# Patient Record
Sex: Male | Born: 1955 | Race: White | Hispanic: No | Smoking: Former smoker
Health system: Southern US, Community
[De-identification: ages and names within clinical notes are randomized; demographics above are authoritative.]

## PROBLEM LIST (undated history)

## (undated) DIAGNOSIS — M069 Rheumatoid arthritis, unspecified: Secondary | ICD-10-CM

## (undated) DIAGNOSIS — J45909 Unspecified asthma, uncomplicated: Secondary | ICD-10-CM

## (undated) DIAGNOSIS — M810 Age-related osteoporosis without current pathological fracture: Secondary | ICD-10-CM

## (undated) DIAGNOSIS — J449 Chronic obstructive pulmonary disease, unspecified: Secondary | ICD-10-CM

## (undated) DIAGNOSIS — C801 Malignant (primary) neoplasm, unspecified: Secondary | ICD-10-CM

## (undated) DIAGNOSIS — J984 Other disorders of lung: Secondary | ICD-10-CM

## (undated) DIAGNOSIS — T8859XA Other complications of anesthesia, initial encounter: Secondary | ICD-10-CM

## (undated) DIAGNOSIS — C679 Malignant neoplasm of bladder, unspecified: Secondary | ICD-10-CM

## (undated) DIAGNOSIS — K219 Gastro-esophageal reflux disease without esophagitis: Secondary | ICD-10-CM

## (undated) DIAGNOSIS — Z87891 Personal history of nicotine dependence: Secondary | ICD-10-CM

## (undated) DIAGNOSIS — N138 Other obstructive and reflux uropathy: Secondary | ICD-10-CM

## (undated) DIAGNOSIS — E785 Hyperlipidemia, unspecified: Secondary | ICD-10-CM

## (undated) DIAGNOSIS — M5430 Sciatica, unspecified side: Secondary | ICD-10-CM

## (undated) DIAGNOSIS — K4090 Unilateral inguinal hernia, without obstruction or gangrene, not specified as recurrent: Secondary | ICD-10-CM

## (undated) DIAGNOSIS — M8580 Other specified disorders of bone density and structure, unspecified site: Secondary | ICD-10-CM

## (undated) DIAGNOSIS — T7840XA Allergy, unspecified, initial encounter: Secondary | ICD-10-CM

## (undated) DIAGNOSIS — M4810 Ankylosing hyperostosis [Forestier], site unspecified: Secondary | ICD-10-CM

## (undated) DIAGNOSIS — R0981 Nasal congestion: Secondary | ICD-10-CM

## (undated) DIAGNOSIS — T4145XA Adverse effect of unspecified anesthetic, initial encounter: Secondary | ICD-10-CM

## (undated) DIAGNOSIS — J8410 Pulmonary fibrosis, unspecified: Secondary | ICD-10-CM

## (undated) DIAGNOSIS — I700 Atherosclerosis of aorta: Secondary | ICD-10-CM

## (undated) DIAGNOSIS — H9312 Tinnitus, left ear: Secondary | ICD-10-CM

## (undated) HISTORY — DX: Pulmonary fibrosis, unspecified: J84.10

## (undated) HISTORY — DX: Age-related osteoporosis without current pathological fracture: M81.0

## (undated) HISTORY — DX: Rheumatoid arthritis, unspecified: M06.9

## (undated) HISTORY — DX: Allergy, unspecified, initial encounter: T78.40XA

## (undated) HISTORY — DX: Other disorders of lung: J98.4

## (undated) HISTORY — DX: Sciatica, unspecified side: M54.30

## (undated) HISTORY — PX: VARICOCELECTOMY: SHX1084

## (undated) HISTORY — PX: HERNIA REPAIR: SHX51

## (undated) HISTORY — DX: Hyperlipidemia, unspecified: E78.5

## (undated) HISTORY — DX: Unspecified asthma, uncomplicated: J45.909

## (undated) HISTORY — DX: Gastro-esophageal reflux disease without esophagitis: K21.9

## (undated) HISTORY — PX: BASAL CELL CARCINOMA EXCISION: SHX1214

## (undated) HISTORY — DX: Chronic obstructive pulmonary disease, unspecified: J44.9

---

## 2010-06-06 HISTORY — PX: COLONOSCOPY: SHX174

## 2014-11-15 DIAGNOSIS — J4530 Mild persistent asthma, uncomplicated: Secondary | ICD-10-CM | POA: Insufficient documentation

## 2014-11-15 DIAGNOSIS — M05712 Rheumatoid arthritis with rheumatoid factor of left shoulder without organ or systems involvement: Secondary | ICD-10-CM

## 2014-11-15 DIAGNOSIS — K219 Gastro-esophageal reflux disease without esophagitis: Secondary | ICD-10-CM | POA: Insufficient documentation

## 2014-11-15 DIAGNOSIS — M810 Age-related osteoporosis without current pathological fracture: Secondary | ICD-10-CM | POA: Insufficient documentation

## 2014-11-15 DIAGNOSIS — M05711 Rheumatoid arthritis with rheumatoid factor of right shoulder without organ or systems involvement: Secondary | ICD-10-CM | POA: Insufficient documentation

## 2014-11-15 DIAGNOSIS — E785 Hyperlipidemia, unspecified: Secondary | ICD-10-CM | POA: Insufficient documentation

## 2014-11-15 DIAGNOSIS — E782 Mixed hyperlipidemia: Secondary | ICD-10-CM | POA: Insufficient documentation

## 2014-11-15 DIAGNOSIS — J449 Chronic obstructive pulmonary disease, unspecified: Secondary | ICD-10-CM | POA: Insufficient documentation

## 2015-05-22 DIAGNOSIS — K4090 Unilateral inguinal hernia, without obstruction or gangrene, not specified as recurrent: Secondary | ICD-10-CM | POA: Insufficient documentation

## 2016-02-06 DIAGNOSIS — J8410 Pulmonary fibrosis, unspecified: Secondary | ICD-10-CM | POA: Insufficient documentation

## 2016-11-13 ENCOUNTER — Encounter: Payer: Self-pay | Admitting: Internal Medicine

## 2016-11-13 ENCOUNTER — Other Ambulatory Visit: Payer: Self-pay | Admitting: Internal Medicine

## 2016-11-14 ENCOUNTER — Ambulatory Visit
Admission: RE | Admit: 2016-11-14 | Discharge: 2016-11-14 | Disposition: A | Discharge status: Home / Self Care | Source: Ambulatory Visit | Attending: Internal Medicine | Admitting: Internal Medicine

## 2016-11-14 ENCOUNTER — Ambulatory Visit (INDEPENDENT_AMBULATORY_CARE_PROVIDER_SITE_OTHER): Admitting: Internal Medicine

## 2016-11-14 ENCOUNTER — Encounter: Payer: Self-pay | Admitting: Internal Medicine

## 2016-11-14 VITALS — BP 122/86 | HR 77 | Resp 16 | Ht 70.0 in | Wt 183.0 lb

## 2016-11-14 DIAGNOSIS — E782 Mixed hyperlipidemia: Secondary | ICD-10-CM

## 2016-11-14 DIAGNOSIS — M4810 Ankylosing hyperostosis [Forestier], site unspecified: Secondary | ICD-10-CM | POA: Diagnosis not present

## 2016-11-14 DIAGNOSIS — M05711 Rheumatoid arthritis with rheumatoid factor of right shoulder without organ or systems involvement: Secondary | ICD-10-CM | POA: Diagnosis not present

## 2016-11-14 DIAGNOSIS — J984 Other disorders of lung: Secondary | ICD-10-CM

## 2016-11-14 DIAGNOSIS — M5136 Other intervertebral disc degeneration, lumbar region: Secondary | ICD-10-CM | POA: Diagnosis not present

## 2016-11-14 DIAGNOSIS — J4530 Mild persistent asthma, uncomplicated: Secondary | ICD-10-CM

## 2016-11-14 DIAGNOSIS — J8410 Pulmonary fibrosis, unspecified: Secondary | ICD-10-CM | POA: Diagnosis not present

## 2016-11-14 DIAGNOSIS — M05712 Rheumatoid arthritis with rheumatoid factor of left shoulder without organ or systems involvement: Secondary | ICD-10-CM

## 2016-11-14 DIAGNOSIS — M810 Age-related osteoporosis without current pathological fracture: Secondary | ICD-10-CM

## 2016-11-14 NOTE — Progress Notes (Signed)
Date:  11/14/2016   Name:  Nathan Anthony   DOB:  06/09/1956   MRN:  [id number]   Chief Complaint: Establish Care (Follows Rheumotology in [facility] and will stay with him. He can add [place] but I can not add him to care team. )  RA-  On Humira and mobic.  Most of sx in shoulders and right hand.  Has been on Humira for 15 years.  Osteoporosis - on Prolia for the past years.  Doing well on therapy.  Getting this from Rheum in [place].  No hx of prolonged steroid therapy.  Hyperlipidemia  This is a chronic problem. Recent lipid tests were reviewed and are normal. Pertinent negatives include no chest pain, myalgias or shortness of breath. Current antihyperlipidemic treatment includes statins.  Asthma  He complains of cough and wheezing. There is no shortness of breath. This is a chronic problem. Pertinent negatives include no appetite change, chest pain, headaches, myalgias or trouble swallowing. His past medical history is significant for asthma.   Back injury - had 2 injuries to his back while in the [employer].  He has a 10% disability.  In 1997 he was dx'd with ankylosing spondylitis but this was misdiagnosed and is actually DISH.  He has intermittent tingling on both LE's if he spends too much time driving.  This is worsening over the past 2 years.  He discussed this with Rheum who deferred further evaluation.  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  It was discovered incidentally when he was worked up for new onset asthma around age 13.  Review of Systems  Constitutional: Negative for appetite change, chills, diaphoresis, fatigue and unexpected weight change.  HENT: Negative for hearing loss, tinnitus, trouble swallowing and voice change.   Eyes: Negative for visual disturbance.  Respiratory: Positive for cough and wheezing. Negative for choking and shortness of breath.   Cardiovascular: Negative for chest pain, palpitations and leg swelling.  Gastrointestinal:  Negative for abdominal pain, blood in stool, constipation and diarrhea.  Genitourinary: Negative for difficulty urinating, dysuria and frequency.  Musculoskeletal: Positive for arthralgias and back pain. Negative for myalgias.  Skin: Negative for color change and rash.  Neurological: Negative for dizziness, syncope and headaches.  Hematological: Negative for adenopathy.  Psychiatric/Behavioral: Negative for dysphoric mood and sleep disturbance.    Patient Active Problem List   Diagnosis Date Noted  . 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 Unilateral inguinal hernia without obstruction or gangrene 05/22/2015  . GERD (gastroesophageal reflux disease) 11/15/2014  . Hyperlipidemia 11/15/2014  . Mild persistent asthma without complication 33/29/5188  . Osteoporosis 11/15/2014  . Rheumatoid arthritis involving both shoulders ([place]) 11/15/2014    Prior to Admission medications   Medication Sig Start Date End Date Taking? Authorizing Provider  Adalimumab (HUMIRA PEN) 40 MG/0.8ML PNKT Inject 40 mg into the skin every 14 (fourteen) days. 03/07/16  Yes Historical Provider, MD  denosumab (PROLIA) 60 MG/ML SOLN injection Inject 60 mg into the skin every 6 (six) months.  04/17/15  Yes Historical Provider, MD  fluocinonide cream (LIDEX) 0.05 % Apply topically 2 (two) times daily. 07/19/15  Yes Historical Provider, MD  Fluticasone-Salmeterol (ADVAIR DISKUS) 250-50 MCG/DOSE AEPB Take 1 puff by mouth 2 (two) times daily. 02/06/16 02/05/17 Yes Historical Provider, MD  loratadine (CLARITIN) 10 MG tablet Take 10 mg by mouth daily.   Yes Historical Provider, MD  meloxicam (MOBIC) 15 MG tablet Take 15 mg by mouth daily.   Yes  Historical Provider, MD  montelukast (SINGULAIR) 10 MG tablet Take 10 mg by mouth daily. 08/11/16  Yes Historical Provider, MD  omeprazole (PRILOSEC) 20 MG capsule Take 20 mg by mouth daily. 08/27/16  Yes Historical Provider, MD  rosuvastatin (CRESTOR) 10 MG tablet Take 10 mg by mouth  daily. 08/11/16  Yes Historical Provider, MD    Allergies  Allergen Reactions  . Pravastatin     Other reaction(s): Nausea Only  . Simvastatin     Other reaction(s): Nausea Only    Past Surgical History:  Procedure Laterality Date  . COLONOSCOPY  06/06/2010  . HERNIA REPAIR    . VARICOCELECTOMY    . VARICOCELECTOMY      Social History  Substance Use Topics  . Smoking status: Former Smoker    Years: 5.00    Types: Pipe  . Smokeless tobacco: Never Used  . Alcohol use No     Medication list has been reviewed and updated.   Physical Exam  Constitutional: He is oriented to person, place, and time. He appears well-developed. No distress.  HENT:  Head: Normocephalic and atraumatic.  Neck: Normal range of motion. Neck supple.  Cardiovascular: Normal rate, regular rhythm and normal heart sounds.   Pulmonary/Chest: Effort normal and breath sounds normal. No respiratory distress.  Musculoskeletal:       Right hip: He exhibits decreased range of motion and tenderness.       Lumbar back: He exhibits tenderness and bony tenderness.  Neurological: He is alert and oriented to person, place, and time. He has normal strength and normal reflexes. No sensory deficit.  SLR negative  Skin: Skin is warm and dry. No rash noted.  Psychiatric: He has a normal mood and affect. His speech is normal and behavior is normal. Thought content normal.  Nursing note and vitals reviewed.   BP 122/86   Pulse 77   Resp 16   Ht 5\' 10"  (1.778 m)   Wt 183 lb (83 kg)   SpO2 97%   BMI 26.26 kg/m   Assessment and Plan: 1. DISH (diffuse idiopathic skeletal hyperostosis) Consider steroid taper and/or Ortho referral - DG Lumbar Spine 2-3 Views; Future  2. Mild persistent asthma without complication Persistent sx despite optimal therapy - Ambulatory referral to Pulmonology  3. 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[place]) Follow up with Rheum  4. Mixed  hyperlipidemia On statin therapy  5. 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[facility]) Stable, no further follow up needed  6. Osteoporosis without current pathological fracture, unspecified osteoporosis type Continue Prolia through Rheumatology   No orders of the defined types were placed in this encounter.   Halina Maidens, MD [facility]  11/14/2016

## 2016-11-17 ENCOUNTER — Other Ambulatory Visit: Payer: Self-pay | Admitting: Internal Medicine

## 2016-11-17 MED ORDER — PREDNISONE 10 MG PO TABS: ORAL_TABLET | ORAL | 0 refills | Status: DC

## 2016-12-23 ENCOUNTER — Encounter: Payer: Self-pay | Admitting: Internal Medicine

## 2016-12-23 ENCOUNTER — Ambulatory Visit (INDEPENDENT_AMBULATORY_CARE_PROVIDER_SITE_OTHER): Admitting: Internal Medicine

## 2016-12-23 VITALS — BP 148/98 | HR 76 | Resp 16 | Ht 70.0 in | Wt 186.0 lb

## 2016-12-23 DIAGNOSIS — J4540 Moderate persistent asthma, uncomplicated: Secondary | ICD-10-CM

## 2016-12-23 MED ORDER — AZELASTINE-FLUTICASONE 137-50 MCG/ACT NA SUSP: 1.0000 | Freq: Two times a day (BID) | NASAL | 3 refills | Status: DC

## 2016-12-23 NOTE — Patient Instructions (Signed)
Start Dymista Continue Claritan Contineu Advair 250/50 as prescrtibed Check PFT's Need labs for EOS, IgE levels Check alpha one levels

## 2016-12-23 NOTE — Progress Notes (Signed)
[place] [facility] Consultation      Date: 12/23/2016,   MRN# [id number] Nathan Anthony [facility] Aug 23, 1956 Code Status:  Code Status History    This patient does not have a recorded code status. Please follow your organizational policy for patients in this situation.     Hosp day:@LENGTHOFSTAYDAYS @ Referring MD: @ATDPROV @     PCP:      AdmissionWeight: 186 lb (84.4 kg)                 CurrentWeight: 186 lb (84.4 kg) Nathan Anthony is a 61 y.o. old male seen in consultation for ASTHMA at the request of Dr. Carolin Coy     CHIEF COMPLAINT:   Cough and wheezing   HISTORY OF PRESENT ILLNESS   61 yo pleasant white male here today to assess ASTHMA Dx in 1997 with ASTHMA, EIB had been on advair 100 bid for 15 years changed to 250 BID 1 year ago Dx with GERD 1996 Retired from [employer] in 2000 Had frequent ASTHMA attacks 10 years ago Last seen by Pulmonary 2003 Last month had ASTHMA exacerbation had prednisone taper last month-feels better  Has chronic SOB and DOE With wheezing and cough and chest congestion Had smoked pipe for 30 years chronic sputum production Has chronic allergic rhinitis-takes claritan daily His asthma is not that well controlled at this time  Has chornic   PAST MEDICAL HISTORY   Past Medical History:  Diagnosis Date  . Allergy   . Asthma   . GERD (gastroesophageal reflux disease)   . Hyperlipidemia   . Osteoporosis   . Sciatica      SURGICAL HISTORY   Past Surgical History:  Procedure Laterality Date  . COLONOSCOPY  06/06/2010  . HERNIA REPAIR    . VARICOCELECTOMY    . VARICOCELECTOMY       FAMILY HISTORY   Family History  Problem Relation Age of Onset  . Heart disease Mother   . Asthma Mother   . Heart disease Brother   . Ankylosing spondylitis Brother      SOCIAL HISTORY   Social History  Substance Use Topics  . Smoking status: Former Smoker    Years: 5.00    Types: Pipe  . Smokeless tobacco: Never Used  .  Alcohol use No     MEDICATIONS    Home Medication:  Current Outpatient Rx  . Order #: [id number] Class: Historical Med  . Order #: [id number] Class: Historical Med  . Order #: [id number] Class: Historical Med  . Order #: [id number] Class: Historical Med  . Order #: [id number] Class: Historical Med  . Order #: [id number] Class: Historical Med  . Order #: [id number] Class: Historical Med  . Order #: [id number] Class: Historical Med  . Order #: [id number] Class: Normal  . Order #: [id number] Class: Historical Med    Current Medication:  Current Outpatient Prescriptions:  .  Adalimumab (HUMIRA PEN) 40 MG/0.8ML PNKT, Inject 40 mg into the skin every 14 (fourteen) days., Disp: , Rfl:  .  denosumab (PROLIA) 60 MG/ML SOLN injection, Inject 60 mg into the skin every 6 (six) months. , Disp: , Rfl:  .  fluocinonide cream (LIDEX) 0.05 %, Apply topically 2 (two) times daily., Disp: , Rfl:  .  Fluticasone-Salmeterol (ADVAIR DISKUS) 250-50 MCG/DOSE AEPB, Take 1 puff by mouth 2 (two) times daily., Disp: , Rfl:  .  loratadine (CLARITIN) 10 MG tablet, Take 10 mg by mouth daily., Disp: , Rfl:  .  meloxicam (MOBIC) 15 MG tablet, Take 15 mg by mouth daily.,  Disp: , Rfl:  .  montelukast (SINGULAIR) 10 MG tablet, Take 10 mg by mouth daily., Disp: , Rfl:  .  omeprazole (PRILOSEC) 20 MG capsule, Take 20 mg by mouth daily., Disp: , Rfl:  .  predniSONE (DELTASONE) 10 MG tablet, Take 6 on day 1, 5 on day 2, 4 on day 3, 3 on day 4, 2 on day 5 and 1 on day 1 then stop., Disp: 21 tablet, Rfl: 0 .  rosuvastatin (CRESTOR) 10 MG tablet, Take 10 mg by mouth daily., Disp: , Rfl:     ALLERGIES   Pravastatin and Simvastatin     REVIEW OF SYSTEMS   Review of Systems  Constitutional: Negative for chills, diaphoresis, fever, malaise/fatigue and weight loss.  HENT: Negative for congestion and hearing loss.   Eyes: Negative for blurred vision and double vision.  Respiratory: Positive for cough, shortness of breath and wheezing.     Cardiovascular: Negative for chest pain, palpitations and orthopnea.  Gastrointestinal: Negative for abdominal pain, blood in stool, constipation, diarrhea, heartburn, nausea and vomiting.  Genitourinary: Negative for dysuria and urgency.  Musculoskeletal: Negative for back pain, myalgias and neck pain.  Skin: Negative for rash.  Neurological: Negative for dizziness, tingling, tremors, weakness and headaches.  Endo/Heme/Allergies: Does not bruise/bleed easily.  Psychiatric/Behavioral: Negative for depression, substance abuse and suicidal ideas.  All other systems reviewed and are negative.   BP (!) 148/98 (BP Location: Left Arm, Patient Position: Sitting, Cuff Size: Normal)   Pulse 76   Resp 16   Ht 5\' 10"  (1.778 m)   Wt 186 lb (84.4 kg)   SpO2 95%   BMI 26.69 kg/m    PHYSICAL EXAM  Physical Exam  Constitutional: He is oriented to person, place, and time. He appears well-developed and well-nourished. No distress.  HENT:  Head: Normocephalic and atraumatic.  Mouth/Throat: No oropharyngeal exudate.  Eyes: EOM are normal. Pupils are equal, round, and reactive to light. No scleral icterus.  Neck: Normal range of motion. Neck supple.  Cardiovascular: Normal rate, regular rhythm and normal heart sounds.   No murmur heard. Pulmonary/Chest: No stridor. He is in respiratory distress. He has no wheezes.  Abdominal: Soft. Bowel sounds are normal.  Musculoskeletal: Normal range of motion. He exhibits no edema.  Neurological: He is alert and oriented to person, place, and time. No cranial nerve deficit.  Skin: Skin is warm. He is not diaphoretic.  Psychiatric: He has a normal mood and affect.          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 All questions answered  Corrin Parker, M.D.  [facility]  Medical Director [place] Director [facility] Cardio-Pulmonary Department

## 2016-12-23 NOTE — Addendum Note (Signed)
Addended by: Devona Konig on: 12/23/2016 04:27 PM   Modules accepted: Orders

## 2016-12-25 ENCOUNTER — Telehealth: Payer: Self-pay | Admitting: Internal Medicine

## 2016-12-25 NOTE — Telephone Encounter (Signed)
Pt is calling regarding paperwork that needs to be filled out regarding Dymista. Please call.

## 2016-12-25 NOTE — Telephone Encounter (Signed)
Done PA for Dymista which has been approved from 11/24/16 to 08/31/2098.  Case ID: [id number].

## 2016-12-25 NOTE — Telephone Encounter (Signed)
Spoke with pt and he states that [facility] is having an issue with the Dymista. Pt gave a number for [place] to call at [phone]. Will call to f/u on Dymista.

## 2017-01-01 ENCOUNTER — Telehealth: Payer: Self-pay | Admitting: Internal Medicine

## 2017-01-01 DIAGNOSIS — J4540 Moderate persistent asthma, uncomplicated: Secondary | ICD-10-CM

## 2017-01-01 NOTE — Telephone Encounter (Signed)
Pt calling stating we were to send some labs that Dr Mortimer Fries ordered to [facility] They were not there when he called  Please advise

## 2017-01-01 NOTE — Telephone Encounter (Signed)
Spoke with pt and he wants to go to a [facility] site instead of hospital. Labs re-entered. Nothing further needed.

## 2017-01-02 ENCOUNTER — Other Ambulatory Visit: Payer: Self-pay | Admitting: *Deleted

## 2017-01-02 ENCOUNTER — Encounter: Payer: Self-pay | Admitting: Internal Medicine

## 2017-01-02 MED ORDER — ALBUTEROL SULFATE HFA 108 (90 BASE) MCG/ACT IN AERS: 1.0000 | INHALATION_SPRAY | Freq: Four times a day (QID) | RESPIRATORY_TRACT | 3 refills | Status: DC | PRN

## 2017-01-05 ENCOUNTER — Other Ambulatory Visit
Admission: RE | Admit: 2017-01-05 | Discharge: 2017-01-05 | Disposition: A | Discharge status: Home / Self Care | Source: Ambulatory Visit | Attending: Internal Medicine | Admitting: Internal Medicine

## 2017-01-05 ENCOUNTER — Other Ambulatory Visit: Payer: Self-pay | Admitting: Internal Medicine

## 2017-01-05 DIAGNOSIS — K4090 Unilateral inguinal hernia, without obstruction or gangrene, not specified as recurrent: Secondary | ICD-10-CM | POA: Diagnosis not present

## 2017-01-05 DIAGNOSIS — J8410 Pulmonary fibrosis, unspecified: Secondary | ICD-10-CM

## 2017-01-05 LAB — CBC WITH DIFFERENTIAL/PLATELET
BASOS PCT: 1 %
Basophils Absolute: 0.1 10*3/uL (ref 0–0.1)
EOS ABS: 0.6 10*3/uL (ref 0–0.7)
EOS PCT: 8 %
HCT: 43.8 % (ref 40.0–52.0)
HEMOGLOBIN: 15.1 g/dL (ref 13.0–18.0)
Lymphocytes Relative: 36 %
Lymphs Abs: 2.9 10*3/uL (ref 1.0–3.6)
MCH: 30.7 pg (ref 26.0–34.0)
MCHC: 34.6 g/dL (ref 32.0–36.0)
MCV: 88.7 fL (ref 80.0–100.0)
Monocytes Absolute: 0.6 10*3/uL (ref 0.2–1.0)
Monocytes Relative: 8 %
NEUTROS PCT: 47 %
Neutro Abs: 3.9 10*3/uL (ref 1.4–6.5)
PLATELETS: 269 10*3/uL (ref 150–440)
RBC: 4.93 MIL/uL (ref 4.40–5.90)
RDW: 13.7 % (ref 11.5–14.5)
WBC: 8.2 10*3/uL (ref 3.8–10.6)

## 2017-01-07 ENCOUNTER — Other Ambulatory Visit
Admission: RE | Admit: 2017-01-07 | Discharge: 2017-01-07 | Disposition: A | Discharge status: Home / Self Care | Source: Ambulatory Visit | Attending: Internal Medicine | Admitting: Internal Medicine

## 2017-01-08 LAB — EOSINOPHIL, URINE: Eosinophil, Urine: NONE SEEN %

## 2017-01-08 LAB — IGE: IgE (Immunoglobulin E), Serum: 119 IU/mL — ABNORMAL HIGH (ref 0–100)

## 2017-01-15 ENCOUNTER — Ambulatory Visit: Attending: Internal Medicine

## 2017-01-15 DIAGNOSIS — J4540 Moderate persistent asthma, uncomplicated: Secondary | ICD-10-CM | POA: Diagnosis not present

## 2017-01-15 MED ORDER — ALBUTEROL SULFATE (2.5 MG/3ML) 0.083% IN NEBU
2.5000 mg | INHALATION_SOLUTION | Freq: Once | RESPIRATORY_TRACT | Status: AC
  Administered 2017-01-15: 2.5 mg via RESPIRATORY_TRACT
  Filled 2017-01-15: qty 3

## 2017-01-20 ENCOUNTER — Encounter: Payer: Self-pay | Admitting: Internal Medicine

## 2017-01-20 ENCOUNTER — Ambulatory Visit (INDEPENDENT_AMBULATORY_CARE_PROVIDER_SITE_OTHER): Admitting: Internal Medicine

## 2017-01-20 VITALS — BP 122/90 | HR 86 | Resp 16 | Ht 70.0 in | Wt 172.0 lb

## 2017-01-20 DIAGNOSIS — J449 Chronic obstructive pulmonary disease, unspecified: Secondary | ICD-10-CM | POA: Diagnosis not present

## 2017-01-20 DIAGNOSIS — J4540 Moderate persistent asthma, uncomplicated: Secondary | ICD-10-CM

## 2017-01-20 DIAGNOSIS — G4719 Other hypersomnia: Secondary | ICD-10-CM

## 2017-01-20 MED ORDER — AZELASTINE-FLUTICASONE 137-50 MCG/ACT NA SUSP: 1.0000 | Freq: Two times a day (BID) | NASAL | 0 refills | Status: DC

## 2017-01-20 NOTE — Patient Instructions (Signed)
Check alpha one levels Check ONO Start Spiriva Handihaler

## 2017-01-20 NOTE — Addendum Note (Signed)
Addended by: Devona Konig on: 01/20/2017 10:55 AM   Modules accepted: Orders

## 2017-01-20 NOTE — Progress Notes (Signed)
[place] [facility] Consultation      Date: 01/20/2017,   MRN# [id number] Nathan Anthony [facility] Jan 18, 1956 Code Status:  Code Status History    This patient does not have a recorded code status. Please follow your organizational policy for patients in this situation.     Hosp day:@LENGTHOFSTAYDAYS @ Referring MD: @ATDPROV @     PCP:      Admission                  Current  Nathan Anthony is a 61 y.o. old male seen in consultation for ASTHMA at the request of Dr. Carolin Coy     CHIEF COMPLAINT:   Cough and wheezing   HISTORY OF PRESENT ILLNESS   PREVIOUS OV and History 61 yo pleasant white male here today to assess ASTHMA Dx in 1997 with ASTHMA, EIB had been on advair 100 bid for 15 years changed to 250 BID 1 year ago Dx with GERD 1996 Retired from [employer] in 2000 Had frequent ASTHMA attacks 10 years ago Last seen by Pulmonary 2003 Last month had ASTHMA exacerbation had prednisone taper last month-feels better  Has chronic SOB and DOE With wheezing and cough and chest congestion Had smoked pipe for 30 years chronic sputum production Has chronic allergic rhinitis-takes claritan daily His asthma is not that well controlled at this time   OV 01/20/2017 Follow up PFT's show ratio 69% with Fev1 67% +BD response Scooping of exp limb,air trapping Findings c/w Moderate COPD Has intermittent SOB and DOE Has intermittent wheezing Takes advair No signs of infection at this time  Patient has been having excessive daytime sleepiness Patient has been having extreme fatigue and tiredness, lack of energy Labs and PFT's reviewed with patient      Current Medication:  Current Outpatient Prescriptions:  .  Adalimumab (HUMIRA PEN) 40 MG/0.8ML PNKT, Inject 40 mg into the skin every 14 (fourteen) days., Disp: , Rfl:  .  albuterol (PROVENTIL HFA;VENTOLIN HFA) 108 (90 Base) MCG/ACT inhaler, Inhale 1 puff into the lungs every 6 (six) hours as needed for wheezing or  shortness of breath., Disp: 3 Inhaler, Rfl: 3 .  Azelastine-Fluticasone (DYMISTA) 137-50 MCG/ACT SUSP, Place 1 spray into the nose 2 (two) times daily., Disp: 3 Bottle, Rfl: 3 .  denosumab (PROLIA) 60 MG/ML SOLN injection, Inject 60 mg into the skin every 6 (six) months. , Disp: , Rfl:  .  fluocinonide cream (LIDEX) 0.05 %, Apply topically 2 (two) times daily., Disp: , Rfl:  .  Fluticasone-Salmeterol (ADVAIR DISKUS) 250-50 MCG/DOSE AEPB, Take 1 puff by mouth 2 (two) times daily., Disp: , Rfl:  .  loratadine (CLARITIN) 10 MG tablet, Take 10 mg by mouth daily., Disp: , Rfl:  .  meloxicam (MOBIC) 15 MG tablet, Take 15 mg by mouth daily., Disp: , Rfl:  .  montelukast (SINGULAIR) 10 MG tablet, Take 10 mg by mouth daily., Disp: , Rfl:  .  omeprazole (PRILOSEC) 20 MG capsule, Take 20 mg by mouth daily., Disp: , Rfl:  .  PROCTOFOAM HC rectal foam, Place 1 application rectally as needed., Disp: , Rfl:  .  rosuvastatin (CRESTOR) 10 MG tablet, Take 10 mg by mouth daily., Disp: , Rfl:     ALLERGIES   Pravastatin and Simvastatin     REVIEW OF SYSTEMS   Review of Systems  Constitutional: Negative for chills, diaphoresis, fever, malaise/fatigue and weight loss.  HENT: Positive for congestion. Negative for hearing loss.   Eyes: Negative for blurred vision and  double vision.  Respiratory: Positive for cough, shortness of breath and wheezing.   Cardiovascular: Negative for chest pain, palpitations and orthopnea.  Gastrointestinal: Negative for heartburn.  Skin: Negative for rash.  Neurological: Negative for weakness.  All other systems reviewed and are negative.   BP 122/90 (BP Location: Right Arm, Cuff Size: Normal)   Pulse 86   Resp 16   Ht 5\' 10"  (1.778 m)   Wt 172 lb (78 kg)   SpO2 98%   BMI 24.68 kg/m    PHYSICAL EXAM  Physical Exam  Constitutional: He is oriented to person, place, and time. He appears well-developed and well-nourished. No distress.  Eyes: Pupils are equal, round,  and reactive to light.  Cardiovascular: Normal rate, regular rhythm and normal heart sounds.   No murmur heard. Pulmonary/Chest: Breath sounds normal. No stridor. No respiratory distress. He has no wheezes.  Musculoskeletal: Normal range of motion. He exhibits no edema.  Neurological: He is alert and oriented to person, place, and time. No cranial nerve deficit.  Skin: Skin is warm. He is not diaphoretic.  Psychiatric: He has a normal mood and affect.       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, art with previous h/o ASTHMA now with Moderate Gold Stage B COPD  with chronic allergic rhinitis with intermittent SOB/Wheezing with excessive daytime sleepiness and fatigue   SOB/wheezing-ASTHMA/COPD -Will prescribe additional inhaler with Spiriva  Allergic Rhinitis -continue dymista -continue claritian -This helps him a lot but is expensive  H/o ASTHMA/now Moderate COPD -continue advri 250/50 Start Spiriva handihaler Albuterol as needed Will need to check for alpha one deficiency -will need to check ONO  Excessive daytime fatigue/sleepniess -patient will need sleep study at this time but has refused at this time   GERD -continue PPI  Follow up after 41months  Patient  satisfied with Plan of action and management. All questions answered  Corrin Parker, M.D.  [facility]  Medical Director [place] Director [facility] Cardio-Pulmonary Department

## 2017-01-22 ENCOUNTER — Encounter: Payer: Self-pay | Admitting: Internal Medicine

## 2017-01-22 DIAGNOSIS — J4540 Moderate persistent asthma, uncomplicated: Secondary | ICD-10-CM

## 2017-02-03 ENCOUNTER — Telehealth: Payer: Self-pay | Admitting: *Deleted

## 2017-02-03 NOTE — Telephone Encounter (Signed)
Pt informed he does NOT need O2 for night time use. Nothing further needed.

## 2017-02-16 ENCOUNTER — Other Ambulatory Visit: Payer: Self-pay | Admitting: Internal Medicine

## 2017-02-16 ENCOUNTER — Encounter: Payer: Self-pay | Admitting: Internal Medicine

## 2017-02-16 MED ORDER — ROSUVASTATIN CALCIUM 10 MG PO TABS: 10.0000 mg | ORAL_TABLET | Freq: Every day | ORAL | 1 refills | Status: DC

## 2017-02-16 NOTE — Telephone Encounter (Signed)
Medication refill request.

## 2017-02-17 ENCOUNTER — Other Ambulatory Visit: Payer: Self-pay | Admitting: Internal Medicine

## 2017-02-17 MED ORDER — ROSUVASTATIN CALCIUM 10 MG PO TABS: 10.0000 mg | ORAL_TABLET | Freq: Every day | ORAL | 1 refills | Status: DC

## 2017-02-17 NOTE — Telephone Encounter (Signed)
Patient response

## 2017-03-13 ENCOUNTER — Telehealth: Payer: Self-pay | Admitting: *Deleted

## 2017-03-13 NOTE — Telephone Encounter (Signed)
Patient returning call ..please try again

## 2017-03-13 NOTE — Telephone Encounter (Signed)
R/T call to patient he misunderstood when he was given results of ONO. He thought he did not still need sleep study performed. Re-explained to patient that was to determine if night-time 02 was needed. He still needs to schedule sleep study. Patient has been out of town but will call and schedule.

## 2017-03-13 NOTE — Telephone Encounter (Signed)
Attempted to contact patient in regards to scheduling sleep study. Received info that [facility] has been trying to contact patient. Asked that patient return call so that we know how to proceed.

## 2017-04-01 ENCOUNTER — Ambulatory Visit: Attending: Internal Medicine

## 2017-04-01 ENCOUNTER — Encounter: Payer: Self-pay | Admitting: Internal Medicine

## 2017-04-01 DIAGNOSIS — G4710 Hypersomnia, unspecified: Secondary | ICD-10-CM | POA: Diagnosis not present

## 2017-04-01 DIAGNOSIS — G4730 Sleep apnea, unspecified: Secondary | ICD-10-CM | POA: Diagnosis present

## 2017-04-01 DIAGNOSIS — G4719 Other hypersomnia: Secondary | ICD-10-CM

## 2017-04-01 DIAGNOSIS — R0683 Snoring: Secondary | ICD-10-CM | POA: Diagnosis not present

## 2017-04-06 ENCOUNTER — Encounter: Payer: Self-pay | Admitting: Internal Medicine

## 2017-04-06 ENCOUNTER — Telehealth: Payer: Self-pay | Admitting: *Deleted

## 2017-04-06 ENCOUNTER — Telehealth: Payer: Self-pay | Admitting: Internal Medicine

## 2017-04-06 DIAGNOSIS — G4710 Hypersomnia, unspecified: Secondary | ICD-10-CM

## 2017-04-06 NOTE — Telephone Encounter (Signed)
Pt would like be seen sooner for sleep study results Pt declined Sept 12th appt, he is unavaliable that week Scheduled next available for Sept 17

## 2017-04-06 NOTE — Telephone Encounter (Signed)
-----   Message from Laverle Hobby, MD sent at 04/06/2017  8:54 AM EDT ----- Regarding: sleep study results This study is negative for Sleep Apnea.   Recommendation:  Follow up with referring physician.

## 2017-04-06 NOTE — Telephone Encounter (Signed)
Patient has been informed negative for OSA. Patient informed to keep his regular scheduled f/u with Dr. Mortimer Fries.

## 2017-04-06 NOTE — Telephone Encounter (Signed)
Patient aware of results of sleep study. Nothing further needed.

## 2017-04-13 ENCOUNTER — Other Ambulatory Visit: Payer: Self-pay

## 2017-04-13 ENCOUNTER — Encounter: Payer: Self-pay | Admitting: Internal Medicine

## 2017-04-13 MED ORDER — OMEPRAZOLE 20 MG PO CPDR
20.0000 mg | DELAYED_RELEASE_CAPSULE | Freq: Every day | ORAL | 0 refills | Status: DC
Start: 2017-04-13 — End: 2017-04-22

## 2017-04-13 MED ORDER — MONTELUKAST SODIUM 10 MG PO TABS: 10.0000 mg | ORAL_TABLET | Freq: Every day | ORAL | 0 refills | Status: DC

## 2017-04-22 ENCOUNTER — Encounter: Payer: Self-pay | Admitting: Internal Medicine

## 2017-04-22 ENCOUNTER — Ambulatory Visit (INDEPENDENT_AMBULATORY_CARE_PROVIDER_SITE_OTHER): Admitting: Internal Medicine

## 2017-04-22 VITALS — BP 112/76 | HR 64 | Resp 16 | Ht 70.0 in | Wt 180.2 lb

## 2017-04-22 DIAGNOSIS — H9312 Tinnitus, left ear: Secondary | ICD-10-CM | POA: Diagnosis not present

## 2017-04-22 DIAGNOSIS — K219 Gastro-esophageal reflux disease without esophagitis: Secondary | ICD-10-CM

## 2017-04-22 DIAGNOSIS — M810 Age-related osteoporosis without current pathological fracture: Secondary | ICD-10-CM

## 2017-04-22 DIAGNOSIS — M05711 Rheumatoid arthritis with rheumatoid factor of right shoulder without organ or systems involvement: Secondary | ICD-10-CM

## 2017-04-22 DIAGNOSIS — Z0000 Encounter for general adult medical examination without abnormal findings: Secondary | ICD-10-CM | POA: Diagnosis not present

## 2017-04-22 DIAGNOSIS — Z23 Encounter for immunization: Secondary | ICD-10-CM

## 2017-04-22 DIAGNOSIS — Z1159 Encounter for screening for other viral diseases: Secondary | ICD-10-CM | POA: Diagnosis not present

## 2017-04-22 DIAGNOSIS — M4810 Ankylosing hyperostosis [Forestier], site unspecified: Secondary | ICD-10-CM

## 2017-04-22 DIAGNOSIS — M05712 Rheumatoid arthritis with rheumatoid factor of left shoulder without organ or systems involvement: Secondary | ICD-10-CM | POA: Diagnosis not present

## 2017-04-22 DIAGNOSIS — Z125 Encounter for screening for malignant neoplasm of prostate: Secondary | ICD-10-CM | POA: Diagnosis not present

## 2017-04-22 DIAGNOSIS — E782 Mixed hyperlipidemia: Secondary | ICD-10-CM | POA: Diagnosis not present

## 2017-04-22 DIAGNOSIS — J4530 Mild persistent asthma, uncomplicated: Secondary | ICD-10-CM | POA: Diagnosis not present

## 2017-04-22 LAB — POCT URINALYSIS DIPSTICK
BILIRUBIN UA: NEGATIVE
Blood, UA: NEGATIVE
GLUCOSE UA: NEGATIVE
Ketones, UA: NEGATIVE
Leukocytes, UA: NEGATIVE
Nitrite, UA: NEGATIVE
Protein, UA: NEGATIVE
SPEC GRAV UA: 1.01 (ref 1.010–1.025)
UROBILINOGEN UA: 0.2 U/dL
pH, UA: 7 (ref 5.0–8.0)

## 2017-04-22 MED ORDER — MONTELUKAST SODIUM 10 MG PO TABS: 10.0000 mg | ORAL_TABLET | Freq: Every day | ORAL | 1 refills | Status: DC

## 2017-04-22 MED ORDER — OMEPRAZOLE 20 MG PO CPDR: 20.0000 mg | DELAYED_RELEASE_CAPSULE | Freq: Every day | ORAL | 1 refills | Status: DC

## 2017-04-22 NOTE — Progress Notes (Signed)
Date:  04/22/2017   Name:  Nathan Anthony   DOB:  [birth year]   MRN:  [id number]   Chief Complaint: Annual Exam (ringing in ears ) Nathan Anthony is a 61 y.o. male who presents today for his Complete Annual Exam. He feels fairly well. He reports exercising regularly. He reports he is sleeping fairly well.   Hyperlipidemia  This is a chronic problem. Pertinent negatives include no chest pain or shortness of breath. Current antihyperlipidemic treatment includes statins. The current treatment provides significant improvement of lipids.  Gastroesophageal Reflux  He complains of wheezing. He reports no abdominal pain, no chest pain or no coughing. Pertinent negatives include no fatigue. He has tried a PPI for the symptoms. The treatment provided significant relief.  Asthma  He complains of wheezing. There is no cough or shortness of breath. This is a recurrent problem. The problem occurs intermittently. Pertinent negatives include no chest pain or headaches. His symptoms are not alleviated by steroid inhaler, beta-agonist and leukotriene antagonist. His past medical history is significant for asthma.  Rheumatoid Arthritis - currently seeing Rheum in [place] but needs to change to a nearby provider. On Humira and nsaids.  Has about 3 hours of morning stiffness in hands, worse with more activity.   Review of Systems  Constitutional: Negative for chills and fatigue.  HENT: Positive for tinnitus (in left ear). Negative for hearing loss.   Eyes: Negative for visual disturbance.  Respiratory: Positive for wheezing. Negative for cough, chest tightness and shortness of breath.   Cardiovascular: Negative for chest pain, palpitations and leg swelling.  Gastrointestinal: Negative for abdominal pain, blood in stool and constipation.  Endocrine: Negative for polydipsia and polyuria.  Genitourinary: Negative for difficulty urinating, dysuria and hematuria.  Musculoskeletal: Positive for  arthralgias, back pain and joint swelling. Negative for gait problem.  Skin: Negative for color change and rash.  Allergic/Immunologic: Negative for environmental allergies.  Neurological: Negative for dizziness, light-headedness and headaches.  Hematological: Negative for adenopathy.  Psychiatric/Behavioral: Negative for dysphoric mood and sleep disturbance. The patient is not nervous/anxious.     Patient Active Problem List   Diagnosis Date Noted  . DISH (diffuse idiopathic skeletal hyperostosis) 11/14/2016  . 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 Unilateral inguinal hernia without obstruction or gangrene 05/22/2015  . GERD (gastroesophageal reflux disease) 11/15/2014  . Hyperlipidemia 11/15/2014  . Mild persistent asthma without complication 83/15/1761  . Osteoporosis without current pathological fracture 11/15/2014  . 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 Authorizing Provider  Adalimumab (HUMIRA PEN) 40 MG/0.8ML PNKT Inject 40 mg into the skin every 14 (fourteen) days. 03/07/16  Yes [provider]  albuterol (PROVENTIL HFA;VENTOLIN HFA) 108 (90 Base) MCG/ACT inhaler Inhale 1 puff into the lungs every 6 (six) hours as needed for wheezing or shortness of breath. 01/02/17  Yes Flora Lipps, MD  Azelastine-Fluticasone (DYMISTA) 137-50 MCG/ACT SUSP Place 1 spray into the nose 2 (two) times daily. 01/20/17  Yes Flora Lipps, MD  denosumab (PROLIA) 60 MG/ML SOLN injection Inject 60 mg into the skin every 6 (six) months.  04/17/15  Yes [provider]  fluocinonide cream (LIDEX) 0.05 % Apply topically 2 (two) times daily. 07/19/15  Yes [provider]  Fluticasone-Salmeterol (ADVAIR DISKUS) 250-50 MCG/DOSE AEPB Take 1 puff by mouth 2 (two) times daily. 02/06/16 04/22/17 Yes [provider]  loratadine (  CLARITIN) 10 MG  tablet Take 10 mg by mouth daily.   Yes [provider]  meloxicam (MOBIC) 15 MG tablet Take 15 mg by mouth daily.   Yes [provider]  montelukast (SINGULAIR) 10 MG tablet Take 1 tablet (10 mg total) by mouth daily. 04/13/17  Yes [name], MD  omeprazole (PRILOSEC) 20 MG capsule Take 1 capsule (20 mg total) by mouth daily. 04/13/17  Yes [name], MD  rosuvastatin (CRESTOR) 10 MG tablet Take 1 tablet (10 mg total) by mouth daily. 02/17/17  Yes [name], MD    Allergies  Allergen Reactions  . Pravastatin     Other reaction(s): Nausea Only  . Simvastatin     Other reaction(s): Nausea Only    Past Surgical History:  Procedure Laterality Date  . COLONOSCOPY  06/06/2010  . HERNIA REPAIR    . VARICOCELECTOMY    . VARICOCELECTOMY      Social History  Substance Use Topics  . Smoking status: Former Smoker    Years: 5.00    Types: Pipe  . Smokeless tobacco: Never Used  . Alcohol use No   Depression screen PHQ 2/9 04/22/2017  Decreased Interest 0  Down, Depressed, Hopeless 0  PHQ - 2 Score 0     Medication list has been reviewed and updated.   Physical Exam  Constitutional: He is oriented to person, place, and time. He appears well-developed and well-nourished.  HENT:  Head: Normocephalic.  Right Ear: Tympanic membrane, external ear and ear canal normal. No decreased hearing is noted.  Left Ear: Tympanic membrane, external ear and ear canal normal. No decreased hearing is noted.  Nose: Nose normal. Right sinus exhibits no maxillary sinus tenderness. Left sinus exhibits no maxillary sinus tenderness.  Mouth/Throat: Uvula is midline and oropharynx is clear and moist. No posterior oropharyngeal edema or posterior oropharyngeal erythema.  Eyes: Pupils are equal, round, and reactive to light. Conjunctivae and EOM are normal.  Neck: Normal range of motion. Neck supple. Carotid bruit is not present. No thyromegaly present.  Cardiovascular:  Normal rate, regular rhythm, normal heart sounds and intact distal pulses.   Pulmonary/Chest: Effort normal. He has wheezes (few basilar wheezes). Right breast exhibits no mass. Left breast exhibits no mass.  Abdominal: Soft. Normal appearance and bowel sounds are normal. There is no hepatosplenomegaly. There is no tenderness.  Musculoskeletal: He exhibits no edema.       Cervical back: He exhibits decreased range of motion and tenderness.  Mild synovitis of MCP and PIP joints both hands  Lymphadenopathy:    He has no cervical adenopathy.  Neurological: He is alert and oriented to person, place, and time. He has normal strength and normal reflexes. No sensory deficit. Gait normal.  Skin: Skin is warm, dry and intact.  Psychiatric: He has a normal mood and affect. His speech is normal and behavior is normal. Judgment and thought content normal.  Nursing note and vitals reviewed.   BP 112/76   Pulse 64   Resp 16   Ht 5\' 10"  (1.778 m)   Wt 180 lb 3.2 oz (81.7 kg)   SpO2 98%   BMI 25.86 kg/m   Assessment and Plan: 1. Annual physical exam Improve diet with healthier choices, less fatty foods - POCT urinalysis dipstick  2. Mild persistent asthma without complication Continue meds and inhalers Follow up with Pulmonary for medication management - montelukast (SINGULAIR) 10 MG tablet; Take 1 tablet (10 mg total) by mouth daily.  Dispense: 90 tablet; Refill: 1  3. Gastroesophageal reflux disease, esophagitis presence not specified Controlled on PPI - CBC with Differential/Platelet  4. DISH (diffuse idiopathic skeletal hyperostosis) Stable, on mobic daily - Comprehensive metabolic panel  5. Osteoporosis without current pathological fracture, unspecified osteoporosis type On Prolia, followed by rheum  6. 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 Mixed hyperlipidemia On statin therapy - Lipid panel  8. Prostate cancer screening DRE deferred to lack of sx - PSA  9. Need for hepatitis C screening test - Hepatitis C antibody  10. Need for pneumococcal vaccination - Pneumococcal polysaccharide vaccine 23-valent greater than or equal to 2yo subcutaneous/IM  11. Tinnitus of left ear - Ambulatory referral to ENT   Meds ordered this encounter  Medications  . omeprazole (PRILOSEC) 20 MG capsule    Sig: Take 1 capsule (20 mg total) by mouth daily.    Dispense:  90 capsule    Refill:  1  . montelukast (SINGULAIR) 10 MG tablet    Sig: Take 1 tablet (10 mg total) by mouth daily.    Dispense:  90 tablet    Refill:  [place], MD [facility]  04/22/2017

## 2017-04-22 NOTE — Patient Instructions (Signed)

## 2017-04-23 LAB — COMPREHENSIVE METABOLIC PANEL
A/G RATIO: 1.3 (ref 1.2–2.2)
ALT: 17 IU/L (ref 0–44)
AST: 26 IU/L (ref 0–40)
Albumin: 3.9 g/dL (ref 3.6–4.8)
Alkaline Phosphatase: 53 IU/L (ref 39–117)
BUN/Creatinine Ratio: 15 (ref 10–24)
BUN: 12 mg/dL (ref 8–27)
Bilirubin Total: 0.4 mg/dL (ref 0.0–1.2)
CALCIUM: 9.1 mg/dL (ref 8.6–10.2)
CO2: 21 mmol/L (ref 20–29)
CREATININE: 0.79 mg/dL (ref 0.76–1.27)
Chloride: 104 mmol/L (ref 96–106)
GFR, EST AFRICAN AMERICAN: 113 mL/min/{1.73_m2} (ref >59)
GFR, EST NON AFRICAN AMERICAN: 98 mL/min/{1.73_m2} (ref >59)
Globulin, Total: 3.1 g/dL (ref 1.5–4.5)
Glucose: 84 mg/dL (ref 65–99)
Potassium: 4.7 mmol/L (ref 3.5–5.2)
Sodium: 142 mmol/L (ref 134–144)
TOTAL PROTEIN: 7 g/dL (ref 6.0–8.5)

## 2017-04-23 LAB — CBC WITH DIFFERENTIAL/PLATELET
BASOS: 1 %
Basophils Absolute: 0.1 10*3/uL (ref 0.0–0.2)
EOS (ABSOLUTE): 0.5 10*3/uL — AB (ref 0.0–0.4)
Eos: 6 %
Hematocrit: 44.4 % (ref 37.5–51.0)
Hemoglobin: 14.4 g/dL (ref 13.0–17.7)
IMMATURE GRANS (ABS): 0 10*3/uL (ref 0.0–0.1)
Immature Granulocytes: 0 %
LYMPHS: 36 %
Lymphocytes Absolute: 2.8 10*3/uL (ref 0.7–3.1)
MCH: 29.7 pg (ref 26.6–33.0)
MCHC: 32.4 g/dL (ref 31.5–35.7)
MCV: 92 fL (ref 79–97)
Monocytes Absolute: 0.5 10*3/uL (ref 0.1–0.9)
Monocytes: 6 %
NEUTROS ABS: 4.2 10*3/uL (ref 1.4–7.0)
Neutrophils: 51 %
PLATELETS: 304 10*3/uL (ref 150–379)
RBC: 4.85 x10E6/uL (ref 4.14–5.80)
RDW: 14.4 % (ref 12.3–15.4)
WBC: 8 10*3/uL (ref 3.4–10.8)

## 2017-04-23 LAB — LIPID PANEL
CHOL/HDL RATIO: 4.3 ratio (ref 0.0–5.0)
CHOLESTEROL TOTAL: 165 mg/dL (ref 100–199)
HDL: 38 mg/dL — AB (ref >39)
LDL CALC: 98 mg/dL (ref 0–99)
TRIGLYCERIDES: 147 mg/dL (ref 0–149)
VLDL CHOLESTEROL CAL: 29 mg/dL (ref 5–40)

## 2017-04-23 LAB — PSA: Prostate Specific Ag, Serum: 0.6 ng/mL (ref 0.0–4.0)

## 2017-04-23 LAB — HEPATITIS C ANTIBODY: Hep C Virus Ab: <0.1 s/co ratio (ref 0.0–0.9)

## 2017-05-18 ENCOUNTER — Other Ambulatory Visit: Payer: Self-pay | Admitting: Otolaryngology

## 2017-05-18 ENCOUNTER — Ambulatory Visit (INDEPENDENT_AMBULATORY_CARE_PROVIDER_SITE_OTHER): Admitting: Internal Medicine

## 2017-05-18 ENCOUNTER — Encounter: Payer: Self-pay | Admitting: Internal Medicine

## 2017-05-18 VITALS — BP 132/82 | HR 67 | Resp 16 | Ht 70.0 in | Wt 179.0 lb

## 2017-05-18 DIAGNOSIS — J4520 Mild intermittent asthma, uncomplicated: Secondary | ICD-10-CM | POA: Diagnosis not present

## 2017-05-18 DIAGNOSIS — H9312 Tinnitus, left ear: Secondary | ICD-10-CM

## 2017-05-18 NOTE — Progress Notes (Signed)
[facility] Consultation      Date: 05/18/2017,   MRN# [id number] [facility] [birth year] Code Status:  Code Status History    This patient does not have a recorded code status. Please follow your organizational policy for patients in this situation.     Hosp day:@LENGTHOFSTAYDAYS @ Referring MD: @ATDPROV @     PCP:      Admission                  Current  Cutberto Winfree is a 61 y.o. old male seen in consultation for ASTHMA at the request of Dr. Carolin Coy     CHIEF COMPLAINT:   Cough and wheezing   HISTORY OF PRESENT ILLNESS   61 yo Dx in 1997 with ASTHMA, EIB had been on advair 100 bid for 15 years changed to 250 BID 1 year ago Dx with GERD 1996 Retired from [employer] in 2000 Had frequent ASTHMA attacks 10 years ago  Previous OV  PFT's show ratio 69% with Fev1 67% +BD response Scooping of exp limb,air trapping Findings c/w Moderate COPD   Patient has been having excessive daytime sleepiness -sleep study does NOT show evidence of OSA ONO does NOT reveal hypoxia -alpha one antitrypsin levels wnl   SOba nd wheezing have improved His cat died 2 months ago-seems that his asthma and allergies are much better controlled Off of advair for 40 days and feels well  Had smoked pipe for 30 years Has chronic allergic rhinitis-takes claritan daily His asthma is  well controlled at this time  Advair 250/50 causes HA and burning sensation in throat has stopped and not on any inhalers at this time   SLeep study negative of OSA and ONO negative for hypoxia I have reviewed studies with patient   Current Medication:  Current Outpatient Prescriptions:  .  Adalimumab (HUMIRA PEN) 40 MG/0.8ML PNKT, Inject 40 mg into the skin every 14 (fourteen) days., Disp: , Rfl:  .  albuterol (PROVENTIL HFA;VENTOLIN HFA) 108 (90 Base) MCG/ACT inhaler, Inhale 1 puff into the lungs every 6 (six) hours as needed for wheezing or shortness of breath., Disp: 3 Inhaler, Rfl:  3 .  Azelastine-Fluticasone (DYMISTA) 137-50 MCG/ACT SUSP, Place 1 spray into the nose 2 (two) times daily., Disp: 1 Bottle, Rfl: 0 .  denosumab (PROLIA) 60 MG/ML SOLN injection, Inject 60 mg into the skin every 6 (six) months. , Disp: , Rfl:  .  fluocinonide cream (LIDEX) 0.05 %, Apply topically 2 (two) times daily., Disp: , Rfl:  .  Fluticasone-Salmeterol (ADVAIR DISKUS) 250-50 MCG/DOSE AEPB, Take 1 puff by mouth 2 (two) times daily., Disp: , Rfl:  .  loratadine (CLARITIN) 10 MG tablet, Take 10 mg by mouth daily., Disp: , Rfl:  .  meloxicam (MOBIC) 15 MG tablet, Take 15 mg by mouth daily., Disp: , Rfl:  .  montelukast (SINGULAIR) 10 MG tablet, Take 1 tablet (10 mg total) by mouth daily., Disp: 90 tablet, Rfl: 1 .  omeprazole (PRILOSEC) 20 MG capsule, Take 1 capsule (20 mg total) by mouth daily., Disp: 90 capsule, Rfl: 1 .  rosuvastatin (CRESTOR) 10 MG tablet, Take 1 tablet (10 mg total) by mouth daily., Disp: 90 tablet, Rfl: 1    ALLERGIES   Pravastatin and Simvastatin     REVIEW OF SYSTEMS   Review of Systems  Constitutional: Negative for chills, diaphoresis, fever, malaise/fatigue and weight loss.  HENT: Negative for congestion and hearing loss.   Eyes: Negative for blurred vision and  double vision.  Respiratory: Negative for cough, shortness of breath and wheezing.   Cardiovascular: Negative for chest pain, palpitations and orthopnea.  Gastrointestinal: Negative for heartburn.  Skin: Negative for rash.  Neurological: Negative for weakness.  All other systems reviewed and are negative.  BP 132/82 (BP Location: Left Arm, Cuff Size: Normal)   Pulse 67   Resp 16   Ht 5\' 10"  (1.778 m)   Wt 179 lb (81.2 kg)   SpO2 96%   BMI 25.68 kg/m    PHYSICAL EXAM  Physical Exam  Constitutional: He is oriented to person, place, and time. He appears well-developed and well-nourished. No distress.  Eyes: Pupils are equal, round, and reactive to light.  Cardiovascular: Normal rate,  regular rhythm and normal heart sounds.   No murmur heard. Pulmonary/Chest: Breath sounds normal. No stridor. No respiratory distress. He has no wheezes.  Musculoskeletal: Normal range of motion. He exhibits no edema.  Neurological: He is alert and oriented to person, place, and time. No cranial nerve deficit.  Skin: Skin is warm. He is not diaphoretic.  Psychiatric: He has a normal mood and affect.       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 All questions answered  Corrin Parker, M.D.  [facility]  Medical Director [place] Director [facility] Cardio-Pulmonary Department

## 2017-05-18 NOTE — Patient Instructions (Signed)
continue Singulair Albuterol as needed  Follow up in 6 months

## 2017-05-25 ENCOUNTER — Ambulatory Visit
Admission: RE | Admit: 2017-05-25 | Discharge: 2017-05-25 | Disposition: A | Discharge status: Home / Self Care | Source: Ambulatory Visit | Attending: Otolaryngology | Admitting: Otolaryngology

## 2017-05-25 DIAGNOSIS — H9312 Tinnitus, left ear: Secondary | ICD-10-CM | POA: Diagnosis present

## 2017-05-25 DIAGNOSIS — J320 Chronic maxillary sinusitis: Secondary | ICD-10-CM | POA: Insufficient documentation

## 2017-05-25 MED ORDER — GADOBENATE DIMEGLUMINE 529 MG/ML IV SOLN
15.0000 mL | Freq: Once | INTRAVENOUS | Status: AC | PRN
Start: 2017-05-25 — End: 2017-05-25
  Administered 2017-05-25: 15 mL via INTRAVENOUS

## 2017-08-14 ENCOUNTER — Other Ambulatory Visit: Payer: Self-pay | Admitting: Internal Medicine

## 2017-11-18 ENCOUNTER — Other Ambulatory Visit: Payer: Self-pay | Admitting: Internal Medicine

## 2017-11-18 DIAGNOSIS — J4530 Mild persistent asthma, uncomplicated: Secondary | ICD-10-CM

## 2017-11-30 ENCOUNTER — Encounter: Payer: Self-pay | Admitting: Internal Medicine

## 2017-11-30 ENCOUNTER — Ambulatory Visit (INDEPENDENT_AMBULATORY_CARE_PROVIDER_SITE_OTHER): Admitting: Internal Medicine

## 2017-11-30 VITALS — BP 136/82 | HR 83 | Ht 70.0 in | Wt 186.0 lb

## 2017-11-30 DIAGNOSIS — J4520 Mild intermittent asthma, uncomplicated: Secondary | ICD-10-CM | POA: Diagnosis not present

## 2017-11-30 NOTE — Patient Instructions (Signed)
ALBUTEROL AS NEEDED  AVOID TRIGGERS

## 2017-11-30 NOTE — Progress Notes (Signed)
[facility] Consultation      Date: 11/30/2017,   MRN# [id number] [facility] Jun 17, 1956    AdmissionWeight: 186 lb (84.4 kg)                 CurrentWeight: 186 lb (84.4 kg) Kayvon Mo is a 62 y.o. old male seen in consultation for ASTHMA at the request of Dr. Carolin Coy   Previous OV  PFT's show ratio 69% with Fev1 67% +BD response Scooping of exp limb,air trapping Findings c/w Moderate COPD   Patient has been having excessive daytime sleepiness -sleep study does NOT show evidence of OSA ONO does NOT reveal hypoxia -alpha one antitrypsin levels wnl   SOB and wheezing have improved His cat died 2 months ago-seems that his asthma and allergies are much better controlled Off of advair for 40 days and feels well  Had smoked pipe for 28 years    62 yo Dx in 1997 with ASTHMA, EIB had been on advair 100 bid for 15 years changed to 250 BID 1 year ago Dx with GERD 1996 Retired from [employer] in 2000 Had frequent ASTHMA attacks 10 years ago SLeep study negative of OSA and ONO negative for hypoxia I have reviewed studies with patient    CHIEF COMPLAINT:   Follow up [place]  Has chronic allergic rhinitis-takes claritan daily His asthma is  well controlled at this time Uses albuterol infrequently No signs of exacerbation at this time No signs of infection at this time  Very active Has mild cough Avoids allergens    Current Medication:  Current Outpatient Medications:  .  Adalimumab (HUMIRA PEN) 40 MG/0.8ML PNKT, Inject 40 mg into the skin every 14 (fourteen) days., Disp: , Rfl:  .  albuterol (PROVENTIL HFA;VENTOLIN HFA) 108 (90 Base) MCG/ACT inhaler, Inhale 1 puff into the lungs every 6 (six) hours as needed for wheezing or shortness of breath., Disp: 3 Inhaler, Rfl: 3 .  alendronate (FOSAMAX) 70 MG tablet, Take 70 mg by mouth once a week., Disp: , Rfl:  .  Azelastine-Fluticasone (DYMISTA) 137-50 MCG/ACT SUSP,  Place 1 spray into the nose 2 (two) times daily., Disp: 1 Bottle, Rfl: 0 .  fluocinonide cream (LIDEX) 0.05 %, Apply topically 2 (two) times daily., Disp: , Rfl:  .  Fluticasone-Salmeterol (ADVAIR DISKUS) 250-50 MCG/DOSE AEPB, Take 1 puff by mouth 2 (two) times daily., Disp: , Rfl:  .  hydrocortisone-pramoxine (PROCTOFOAM-HC) rectal foam, Place 1 applicator rectally 2 (two) times daily., Disp: , Rfl:  .  loratadine (CLARITIN) 10 MG tablet, Take 10 mg by mouth daily., Disp: , Rfl:  .  meloxicam (MOBIC) 15 MG tablet, Take 15 mg by mouth daily., Disp: , Rfl:  .  montelukast (SINGULAIR) 10 MG tablet, TAKE 1 TABLET DAILY, Disp: 90 tablet, Rfl: 1 .  omeprazole (PRILOSEC) 20 MG capsule, TAKE 1 CAPSULE DAILY, Disp: 90 capsule, Rfl: 1 .  rosuvastatin (CRESTOR) 10 MG tablet, TAKE 1 TABLET DAILY, Disp: 90 tablet, Rfl: 1    ALLERGIES   Pravastatin and Simvastatin     REVIEW OF SYSTEMS   Review of Systems  Constitutional: Negative for chills, diaphoresis, fever, malaise/fatigue and weight loss.  HENT: Negative for congestion and hearing loss.   Eyes: Negative for blurred vision and double vision.  Respiratory: Negative for cough, shortness of breath and wheezing.   Cardiovascular: Negative for chest pain, palpitations and orthopnea.  Gastrointestinal: Negative for heartburn.  Skin: Negative for rash.  Neurological: Negative for weakness.  All other systems reviewed and are negative.  BP 136/82 (BP Location: Left Arm, Cuff Size: Normal)   Pulse 83   Ht 5\' 10"  (1.778 m)   Wt 186 lb (84.4 kg)   SpO2 97%   BMI 26.69 kg/m    PHYSICAL EXAM  Physical Exam  Constitutional: He is oriented to person, place, and time. He appears well-developed and well-nourished. No distress.  Eyes: Pupils are equal, round, and reactive to light.  Cardiovascular: Normal rate, regular rhythm and normal heart sounds.  No murmur heard. Pulmonary/Chest: Breath sounds normal. No stridor. No respiratory distress. He  has no wheezes.  Musculoskeletal: Normal range of motion. He exhibits no edema.  Neurological: He is alert and oriented to person, place, and time. No cranial nerve deficit.  Skin: Skin is warm. He is not diaphoretic.  Psychiatric: He has a normal mood and affect.       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 All questions answered  Corrin Parker, M.D.  [facility]  Medical Director [place] Director [facility] Cardio-Pulmonary Department

## 2018-02-15 ENCOUNTER — Other Ambulatory Visit: Payer: Self-pay | Admitting: Internal Medicine

## 2018-04-22 ENCOUNTER — Ambulatory Visit (INDEPENDENT_AMBULATORY_CARE_PROVIDER_SITE_OTHER): Admitting: Internal Medicine

## 2018-04-22 ENCOUNTER — Encounter: Payer: Self-pay | Admitting: Internal Medicine

## 2018-04-22 VITALS — BP 122/78 | HR 71 | Ht 70.0 in | Wt 181.0 lb

## 2018-04-22 DIAGNOSIS — N401 Enlarged prostate with lower urinary tract symptoms: Secondary | ICD-10-CM | POA: Insufficient documentation

## 2018-04-22 DIAGNOSIS — M810 Age-related osteoporosis without current pathological fracture: Secondary | ICD-10-CM

## 2018-04-22 DIAGNOSIS — E782 Mixed hyperlipidemia: Secondary | ICD-10-CM

## 2018-04-22 DIAGNOSIS — Z125 Encounter for screening for malignant neoplasm of prostate: Secondary | ICD-10-CM | POA: Diagnosis not present

## 2018-04-22 DIAGNOSIS — M05712 Rheumatoid arthritis with rheumatoid factor of left shoulder without organ or systems involvement: Secondary | ICD-10-CM

## 2018-04-22 DIAGNOSIS — J449 Chronic obstructive pulmonary disease, unspecified: Secondary | ICD-10-CM | POA: Insufficient documentation

## 2018-04-22 DIAGNOSIS — M05711 Rheumatoid arthritis with rheumatoid factor of right shoulder without organ or systems involvement: Secondary | ICD-10-CM | POA: Diagnosis not present

## 2018-04-22 DIAGNOSIS — J4530 Mild persistent asthma, uncomplicated: Secondary | ICD-10-CM | POA: Diagnosis not present

## 2018-04-22 DIAGNOSIS — K219 Gastro-esophageal reflux disease without esophagitis: Secondary | ICD-10-CM

## 2018-04-22 DIAGNOSIS — Z0000 Encounter for general adult medical examination without abnormal findings: Secondary | ICD-10-CM | POA: Diagnosis not present

## 2018-04-22 DIAGNOSIS — R319 Hematuria, unspecified: Secondary | ICD-10-CM

## 2018-04-22 DIAGNOSIS — N400 Enlarged prostate without lower urinary tract symptoms: Secondary | ICD-10-CM

## 2018-04-22 DIAGNOSIS — N138 Other obstructive and reflux uropathy: Secondary | ICD-10-CM | POA: Insufficient documentation

## 2018-04-22 HISTORY — DX: Chronic obstructive pulmonary disease, unspecified: J44.9

## 2018-04-22 LAB — POCT URINALYSIS DIPSTICK
Bilirubin, UA: NEGATIVE
GLUCOSE UA: NEGATIVE
Ketones, UA: NEGATIVE
Leukocytes, UA: NEGATIVE
Nitrite, UA: NEGATIVE
Protein, UA: NEGATIVE
SPEC GRAV UA: 1.01 (ref 1.010–1.025)
Urobilinogen, UA: 0.2 E.U./dL
pH, UA: 6 (ref 5.0–8.0)

## 2018-04-22 MED ORDER — HYDROCORTISONE ACE-PRAMOXINE 1-1 % RE FOAM: 1.0000 | Freq: Two times a day (BID) | RECTAL | 3 refills | Status: DC

## 2018-04-22 NOTE — Progress Notes (Signed)
Date:  04/22/2018   Name:  Nathan Anthony   DOB:  1956/06/04   MRN:  [id number]   Chief Complaint: Annual Exam Nathan Anthony is a 62 y.o. male who presents today for his Complete Annual Exam. He feels well. He reports exercising regularly. He reports he is sleeping well. Colonoscopy was done in 2011.  Asthma  There is no shortness of breath or wheezing. This is a chronic problem. The problem occurs rarely. The problem has been rapidly improving. Pertinent negatives include no appetite change, chest pain, headaches, myalgias or trouble swallowing. His symptoms are alleviated by beta-agonist and leukotriene antagonist. He reports significant improvement on treatment. His past medical history is significant for asthma. Past medical history comments: And allergies - stopped Advair since much improved (instructed by Pulmonary).  Hyperlipidemia  This is a chronic problem. Recent lipid tests were reviewed and are normal. Pertinent negatives include no chest pain, myalgias or shortness of breath. Current antihyperlipidemic treatment includes statins. The current treatment provides significant improvement of lipids. There are no compliance problems.   Gastroesophageal Reflux  He reports no abdominal pain, no chest pain, no choking or no wheezing. This is a chronic problem. The problem occurs rarely. Pertinent negatives include no fatigue. He has tried a PPI for the symptoms. The treatment provided significant relief.  RA  And DISH - on Humira and has most of his sx in his back and hands.  He remains active without significant limitations.  Hematuria - he noticed small clots in his urine with no other sx after intercourse earlier this week.  He says that this has happened before about 20 years ago.  He has been trying to drink more water. He is not interested in STD testing.  OP - off of Prolia and now on oral Fosamax.  Followed by Endocrinology Dr. Honor Junes.  No heartburn or swallowing  problems.   Review of Systems  Constitutional: Negative for appetite change, chills, diaphoresis, fatigue and unexpected weight change.  HENT: Negative for hearing loss, tinnitus, trouble swallowing and voice change.   Eyes: Negative for visual disturbance.  Respiratory: Negative for choking, shortness of breath and wheezing.   Cardiovascular: Negative for chest pain, palpitations and leg swelling.  Gastrointestinal: Negative for abdominal pain, blood in stool, constipation and diarrhea.  Genitourinary: Negative for difficulty urinating, dysuria and frequency.  Musculoskeletal: Negative for arthralgias, back pain and myalgias.  Skin: Negative for color change and rash.  Neurological: Negative for dizziness, syncope and headaches.  Hematological: Negative for adenopathy.  Psychiatric/Behavioral: Negative for dysphoric mood and sleep disturbance.    Patient Active Problem List   Diagnosis Date Noted  . DISH (diffuse idiopathic skeletal hyperostosis) 11/14/2016  . 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 Unilateral inguinal hernia without obstruction or gangrene 05/22/2015  . GERD (gastroesophageal reflux disease) 11/15/2014  . Hyperlipidemia 11/15/2014  . Mild persistent asthma without complication 28/36/6294  . Osteoporosis without current pathological fracture 11/15/2014  . 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 Pravastatin     Other reaction(s): Nausea Only  . Simvastatin     Other reaction(s): Nausea Only    Past Surgical History:  Procedure Laterality Date  . COLONOSCOPY  06/06/2010  . HERNIA REPAIR    . VARICOCELECTOMY    . VARICOCELECTOMY      Social History   Tobacco Use  . Smoking status: Former Smoker  Years: 5.00    Types: Pipe  . Smokeless tobacco: Never Used  Substance Use Topics  . Alcohol use: No  . Drug use: No     Medication list has been  reviewed and updated.  Current Meds  Medication Sig  . Adalimumab (HUMIRA PEN) 40 MG/0.8ML PNKT Inject 40 mg into the skin every 14 (fourteen) days.  Marland Kitchen albuterol (PROVENTIL HFA;VENTOLIN HFA) 108 (90 Base) MCG/ACT inhaler Inhale 1 puff into the lungs every 6 (six) hours as needed for wheezing or shortness of breath.  Marland Kitchen alendronate (FOSAMAX) 70 MG tablet Take 70 mg by mouth once a week.  . Azelastine-Fluticasone (DYMISTA) 137-50 MCG/ACT SUSP Place 1 spray into the nose 2 (two) times daily.  . calcium-vitamin D 250-100 MG-UNIT tablet Take 1 tablet by mouth 2 (two) times daily.  . fluocinonide cream (LIDEX) 0.05 % Apply topically 2 (two) times daily.  . hydrocortisone-pramoxine (PROCTOFOAM-HC) rectal foam Place 1 applicator rectally 2 (two) times daily.  Marland Kitchen loratadine (CLARITIN) 10 MG tablet Take 10 mg by mouth daily.  . meloxicam (MOBIC) 15 MG tablet Take 15 mg by mouth daily.  . montelukast (SINGULAIR) 10 MG tablet TAKE 1 TABLET DAILY  . Multiple Vitamin (MULTIVITAMIN) capsule Take 1 capsule by mouth daily.  Marland Kitchen omeprazole (PRILOSEC) 20 MG capsule TAKE 1 CAPSULE DAILY  . rosuvastatin (CRESTOR) 10 MG tablet TAKE 1 TABLET DAILY  . [DISCONTINUED] loratadine (CLARITIN) 10 MG tablet Take 10 mg by mouth daily.    PHQ 2/9 Scores 04/22/2018 04/22/2017  PHQ - 2 Score 0 0    Physical Exam  Constitutional: He is oriented to person, place, and time. He appears well-developed and well-nourished.  HENT:  Head: Normocephalic.  Right Ear: Tympanic membrane, external ear and ear canal normal.  Left Ear: Tympanic membrane, external ear and ear canal normal.  Nose: Nose normal.  Mouth/Throat: Uvula is midline and oropharynx is clear and moist.  Eyes: Pupils are equal, round, and reactive to light. Conjunctivae and EOM are normal.  Neck: Normal range of motion. Neck supple. Carotid bruit is not present. No thyromegaly present.  Cardiovascular: Normal rate, regular rhythm, normal heart sounds and intact distal  pulses.  Pulmonary/Chest: Effort normal and breath sounds normal. He has no wheezes. Right breast exhibits no mass. Left breast exhibits no mass.  Abdominal: Soft. Normal appearance and bowel sounds are normal. There is no hepatosplenomegaly. There is no tenderness. A hernia is present. Hernia confirmed positive in the left inguinal area. Hernia confirmed negative in the right inguinal area.  Genitourinary: Rectum normal. Rectal exam shows no tenderness. Prostate is enlarged (right lobe without tenderness or nodule). Prostate is not tender.  Musculoskeletal:       Thoracic back: He exhibits decreased range of motion.       Lumbar back: He exhibits decreased range of motion.  Lymphadenopathy:    He has no cervical adenopathy. No inguinal adenopathy noted on the right or left side.  Neurological: He is alert and oriented to person, place, and time. He has normal reflexes.  Skin: Skin is warm, dry and intact.  Psychiatric: He has a normal mood and affect. His speech is normal and behavior is normal. Judgment and thought content normal.  Nursing note and vitals reviewed.   BP 122/78 (BP Location: Right Arm, Patient Position: Sitting, Cuff Size: Normal)   Pulse 71   Ht 5\' 10"  (1.778 m)   Wt 181 lb (82.1 kg)   SpO2 95%   BMI 25.97 kg/m  Assessment and Plan: 1. Annual physical exam - POCT urinalysis dipstick  2. 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 Prostate cancer screening Enlarged prostate - will refer to Urology  - PSA  4. Mild persistent asthma without complication controlled  5. Gastroesophageal reflux disease, esophagitis presence not specified Stable, on daily PPI - CBC with Differential/Platelet  6. Mixed hyperlipidemia On statin therapy - Comprehensive metabolic panel - Lipid panel  7. Chronic obstructive pulmonary disease, unspecified COPD type ([place]) Followed by Pulmonary  8.  Osteoporosis without current pathological fracture, unspecified osteoporosis type On Fosamax Check Vitamin D level - VITAMIN D 25 Hydroxy (Vit-D Deficiency, Fractures)  9. Hematuria, unspecified type If persistent, will need Urology referral   Meds ordered this encounter  Medications  . hydrocortisone-pramoxine (PROCTOFOAM-HC) rectal foam    Sig: Place 1 applicator rectally 2 (two) times daily.    Dispense:  60 g    Refill:  3    Partially dictated using Editor, commissioning. Any errors are unintentional.  Halina Maidens, MD [facility]  04/22/2018   There are no diagnoses linked to this encounter.

## 2018-04-23 LAB — CBC WITH DIFFERENTIAL/PLATELET
BASOS: 1 %
Basophils Absolute: 0.1 10*3/uL (ref 0.0–0.2)
EOS (ABSOLUTE): 0.7 10*3/uL — ABNORMAL HIGH (ref 0.0–0.4)
EOS: 6 %
HEMATOCRIT: 45 % (ref 37.5–51.0)
Hemoglobin: 15 g/dL (ref 13.0–17.7)
IMMATURE GRANS (ABS): 0 10*3/uL (ref 0.0–0.1)
IMMATURE GRANULOCYTES: 0 %
Lymphocytes Absolute: 2.9 10*3/uL (ref 0.7–3.1)
Lymphs: 28 %
MCH: 29.2 pg (ref 26.6–33.0)
MCHC: 33.3 g/dL (ref 31.5–35.7)
MCV: 88 fL (ref 79–97)
MONOS ABS: 0.7 10*3/uL (ref 0.1–0.9)
Monocytes: 7 %
NEUTROS PCT: 58 %
Neutrophils Absolute: 6.1 10*3/uL (ref 1.4–7.0)
Platelets: 332 10*3/uL (ref 150–450)
RBC: 5.13 x10E6/uL (ref 4.14–5.80)
RDW: 15.5 % — AB (ref 12.3–15.4)
WBC: 10.4 10*3/uL (ref 3.4–10.8)

## 2018-04-23 LAB — COMPREHENSIVE METABOLIC PANEL
A/G RATIO: 1.2 (ref 1.2–2.2)
ALT: 24 IU/L (ref 0–44)
AST: 28 IU/L (ref 0–40)
Albumin: 3.9 g/dL (ref 3.6–4.8)
Alkaline Phosphatase: 61 IU/L (ref 39–117)
BUN / CREAT RATIO: 17 (ref 10–24)
BUN: 15 mg/dL (ref 8–27)
Bilirubin Total: 0.2 mg/dL (ref 0.0–1.2)
CALCIUM: 9.3 mg/dL (ref 8.6–10.2)
CO2: 24 mmol/L (ref 20–29)
Chloride: 102 mmol/L (ref 96–106)
Creatinine, Ser: 0.86 mg/dL (ref 0.76–1.27)
GFR, EST AFRICAN AMERICAN: 108 mL/min/{1.73_m2} (ref >59)
GFR, EST NON AFRICAN AMERICAN: 94 mL/min/{1.73_m2} (ref >59)
GLOBULIN, TOTAL: 3.2 g/dL (ref 1.5–4.5)
Glucose: 85 mg/dL (ref 65–99)
POTASSIUM: 4.7 mmol/L (ref 3.5–5.2)
SODIUM: 142 mmol/L (ref 134–144)
TOTAL PROTEIN: 7.1 g/dL (ref 6.0–8.5)

## 2018-04-23 LAB — LIPID PANEL
CHOL/HDL RATIO: 6.1 ratio — AB (ref 0.0–5.0)
Cholesterol, Total: 188 mg/dL (ref 100–199)
HDL: 31 mg/dL — ABNORMAL LOW (ref >39)
LDL Calculated: 79 mg/dL (ref 0–99)
Triglycerides: 392 mg/dL — ABNORMAL HIGH (ref 0–149)
VLDL Cholesterol Cal: 78 mg/dL — ABNORMAL HIGH (ref 5–40)

## 2018-04-23 LAB — VITAMIN D 25 HYDROXY (VIT D DEFICIENCY, FRACTURES): Vit D, 25-Hydroxy: 40.7 ng/mL (ref 30.0–100.0)

## 2018-04-23 LAB — PSA: PROSTATE SPECIFIC AG, SERUM: 0.7 ng/mL (ref 0.0–4.0)

## 2018-04-25 ENCOUNTER — Encounter: Payer: Self-pay | Admitting: Internal Medicine

## 2018-04-26 ENCOUNTER — Other Ambulatory Visit: Payer: Self-pay | Admitting: Internal Medicine

## 2018-04-26 MED ORDER — HYDROCORTISONE ACE-PRAMOXINE 1-1 % RE FOAM: RECTAL | 3 refills | Status: DC

## 2018-04-28 ENCOUNTER — Other Ambulatory Visit: Payer: Self-pay | Admitting: Internal Medicine

## 2018-04-28 DIAGNOSIS — N400 Enlarged prostate without lower urinary tract symptoms: Secondary | ICD-10-CM

## 2018-05-13 ENCOUNTER — Other Ambulatory Visit: Payer: Self-pay | Admitting: Internal Medicine

## 2018-05-15 ENCOUNTER — Encounter: Payer: Self-pay | Admitting: Internal Medicine

## 2018-06-09 ENCOUNTER — Encounter: Payer: Self-pay | Admitting: Urology

## 2018-06-09 ENCOUNTER — Ambulatory Visit (INDEPENDENT_AMBULATORY_CARE_PROVIDER_SITE_OTHER): Admitting: Urology

## 2018-06-09 VITALS — BP 148/82 | HR 63 | Ht 70.0 in | Wt 184.0 lb

## 2018-06-09 DIAGNOSIS — N4289 Other specified disorders of prostate: Secondary | ICD-10-CM | POA: Diagnosis not present

## 2018-06-09 DIAGNOSIS — N138 Other obstructive and reflux uropathy: Secondary | ICD-10-CM

## 2018-06-09 DIAGNOSIS — R310 Gross hematuria: Secondary | ICD-10-CM | POA: Diagnosis not present

## 2018-06-09 DIAGNOSIS — N401 Enlarged prostate with lower urinary tract symptoms: Secondary | ICD-10-CM | POA: Diagnosis not present

## 2018-06-09 DIAGNOSIS — R339 Retention of urine, unspecified: Secondary | ICD-10-CM | POA: Diagnosis not present

## 2018-06-09 LAB — URINALYSIS, COMPLETE
BILIRUBIN UA: NEGATIVE
GLUCOSE, UA: NEGATIVE
KETONES UA: NEGATIVE
Leukocytes, UA: NEGATIVE
Nitrite, UA: NEGATIVE
Protein, UA: NEGATIVE
RBC, UA: NEGATIVE
SPEC GRAV UA: 1.01 (ref 1.005–1.030)
UUROB: 0.2 mg/dL (ref 0.2–1.0)
pH, UA: 7 (ref 5.0–7.5)

## 2018-06-09 LAB — BLADDER SCAN AMB NON-IMAGING: SCAN RESULT: 102

## 2018-06-09 MED ORDER — TAMSULOSIN HCL 0.4 MG PO CAPS: 0.4000 mg | ORAL_CAPSULE | Freq: Every day | ORAL | 11 refills | Status: DC

## 2018-06-09 NOTE — Progress Notes (Signed)
06/09/2018 4:57 PM   Nathan Anthony Jun 02, 1956 [id number]  Referring provider: [name], MD [address] [place], [place] [address]  Chief Complaint  Patient presents with  . Benign Prostatic Hypertrophy    HPI: 62 yo M referred for further evaluation of abnormal rectal exam/prostamegaly.  Patient was noted to have an enlarged gland with asymmetry on rectal exam by his primary care physician.  PSA was 0.7 on 04/22/2018.  He reports today that over the past 10 years, he was had issues he referred to as a "morning weak bladder" which he explained as frequency for the first 3 hours after waking.  He has a weaker stream over the past several years and prolonged voiding.  No straining.  He sits to void by choice.  IPSS as below.      He has cut back on coffee in the past which did not make a difference.   He also reports a history of gross hematuria on and off for the past 3 years.  It is often after something strenuous.  He had an episode of painless gross hematuria following intercourse 1 month ago without clots.  Blood lasted for about 3 days.  He does have a positive urinalysis at his primary care physician's, Dr. [name] office.  He does smoke pipe daily.    PVR 102 cc  IPSS    Row Name 06/09/18 0900         International Prostate Symptom Score   How often have you had the sensation of not emptying your bladder?  More than half the time     How often have you had to urinate less than every two hours?  More than half the time     How often have you found you stopped and started again several times when you urinated?  About half the time     How often have you found it difficult to postpone urination?  Less than 1 in 5 times     How often have you had a weak urinary stream?  Almost always     How often have you had to strain to start urination?  Less than 1 in 5 times     How many times did you typically get up at night to urinate?  None     Total IPSS  Score  18       Quality of Life due to urinary symptoms   If you were to spend the rest of your life with your urinary condition just the way it is now how would you feel about that?  Mostly Disatisfied        Score:  1-7 Mild 8-19 Moderate 20-35 Severe   PMH: Past Medical History:  Diagnosis Date  . Allergy   . Asthma   . 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[place])   . Chronic obstructive pulmonary disease ([place]) 04/22/2018  . GERD (gastroesophageal reflux disease)   . Hyperlipidemia   . Osteoporosis   . RA (rheumatoid arthritis) ([place])   . Sciatica     Surgical History: Past Surgical History:  Procedure Laterality Date  . BASAL CELL CARCINOMA EXCISION    . COLONOSCOPY  06/06/2010  . HERNIA REPAIR    . VARICOCELECTOMY    . VARICOCELECTOMY      Home Medications:  Allergies as of 06/09/2018      Reactions   Pravastatin    Other reaction(s): Nausea Only   Simvastatin    Other  reaction(s): Nausea Only      Medication List        Accurate as of 06/09/18  4:57 PM. Always use your most recent med list.          albuterol 108 (90 Base) MCG/ACT inhaler Commonly known as:  PROVENTIL HFA;VENTOLIN HFA Inhale 1 puff into the lungs every 6 (six) hours as needed for wheezing or shortness of breath.   alendronate 70 MG tablet Commonly known as:  FOSAMAX Take 70 mg by mouth once a week.   Azelastine-Fluticasone 137-50 MCG/ACT Susp Place 1 spray into the nose 2 (two) times daily.   calcium-vitamin D 250-100 MG-UNIT tablet Take 1 tablet by mouth 2 (two) times daily.   fluocinonide cream 0.05 % Commonly known as:  LIDEX Apply topically 2 (two) times daily.   HUMIRA PEN 40 MG/0.8ML Pnkt Generic drug:  Adalimumab Inject 40 mg into the skin every 14 (fourteen) days.   hydrocortisone-pramoxine rectal foam Commonly known as:  PROCTOFOAM-HC One application to rectal area twice a day as needed   loratadine 10 MG tablet Commonly known as:  CLARITIN Take 10 mg by mouth daily.    meloxicam 15 MG tablet Commonly known as:  MOBIC Take 15 mg by mouth daily.   montelukast 10 MG tablet Commonly known as:  SINGULAIR TAKE 1 TABLET DAILY   multivitamin capsule Take 1 capsule by mouth daily.   omeprazole 20 MG capsule Commonly known as:  PRILOSEC TAKE 1 CAPSULE DAILY   rosuvastatin 10 MG tablet Commonly known as:  CRESTOR TAKE 1 TABLET DAILY   tamsulosin 0.4 MG Caps capsule Commonly known as:  FLOMAX Take 1 capsule (0.4 mg total) by mouth daily.       Allergies:  Allergies  Allergen Reactions  . Pravastatin     Other reaction(s): Nausea Only  . Simvastatin     Other reaction(s): Nausea Only    Family History: Family History  Problem Relation Age of Onset  . Heart disease Mother   . Asthma Mother   . Heart disease Brother   . Ankylosing spondylitis Brother     Social History:  reports that he has quit smoking. His smoking use included pipe. He quit after 5.00 years of use. He has never used smokeless tobacco. He reports that he does not drink alcohol or use drugs.  ROS: UROLOGY Frequent Urination?: Yes Hard to postpone urination?: Yes Burning/pain with urination?: No Get up at night to urinate?: No Leakage of urine?: No Urine stream starts and stops?: Yes Trouble starting stream?: Yes Do you have to strain to urinate?: No Blood in urine?: Yes Urinary tract infection?: No Sexually transmitted disease?: No Injury to kidneys or bladder?: No Painful intercourse?: Yes Weak stream?: Yes Erection problems?: Yes Penile pain?: No  Gastrointestinal Nausea?: No Vomiting?: No Indigestion/heartburn?: No Diarrhea?: No Constipation?: No  Constitutional Fever: No Night sweats?: No Weight loss?: No Fatigue?: No  Skin Skin rash/lesions?: Yes Itching?: Yes  Eyes Blurred vision?: No Double vision?: No  Ears/Nose/Throat Sore throat?: No Sinus problems?: Yes  Hematologic/Lymphatic Swollen glands?: No Easy bruising?:  No  Cardiovascular Leg swelling?: No Chest pain?: No  Respiratory Cough?: Yes Shortness of breath?: Yes  Endocrine Excessive thirst?: No  Musculoskeletal Back pain?: Yes Joint pain?: Yes  Neurological Headaches?: No Dizziness?: No  Psychologic Depression?: No Anxiety?: No  Physical Exam: BP (!) 148/82 (BP Location: Left Arm, Patient Position: Sitting, Cuff Size: Normal)   Pulse 63   Ht 5\' 10"  (1.778 m)  Wt 184 lb (83.5 kg)   BMI 26.40 kg/m   Constitutional:  Alert and oriented, No acute distress. HEENT: [place] AT, moist mucus membranes.  Trachea midline, no masses. Cardiovascular: No clubbing, cyanosis, or edema. Respiratory: Normal respiratory effort, no increased work of breathing. GI: Abdomen is soft, nontender, nondistended, no abdominal masses GU: No CVA tenderness Rectal: + hemorroids.  Normal sphincter tone.  R>L lobe, rubbery, 40 g Lymph: No cervical or inguinal lymphadenopathy. Skin: No rashes, bruises or suspicious lesions. Neurologic: Grossly intact, no focal deficits, moving all 4 extremities. Psychiatric: Normal mood and affect.  Laboratory Data: Lab Results  Component Value Date   WBC 10.4 04/22/2018   HGB 15.0 04/22/2018   HCT 45.0 04/22/2018   MCV 88 04/22/2018   PLT 332 04/22/2018    Lab Results  Component Value Date   CREATININE 0.86 04/22/2018    Same as above  Urinalysis Results for orders placed or performed in visit on 06/09/18  Urinalysis, Complete  Result Value Ref Range   Specific Gravity, UA 1.010 1.005 - 1.030   pH, UA 7.0 5.0 - 7.5   Color, UA Yellow Yellow   Appearance Ur Clear Clear   Leukocytes, UA Negative Negative   Protein, UA Negative Negative/Trace   Glucose, UA Negative Negative   Ketones, UA Negative Negative   RBC, UA Negative Negative   Bilirubin, UA Negative Negative   Urobilinogen, Ur 0.2 0.2 - 1.0 mg/dL   Nitrite, UA Negative Negative  BLADDER SCAN AMB NON-IMAGING  Result Value Ref Range   Scan  Result 102     Assessment & Plan:    1. Gross hematuria Recurrent episodes of painless gross hematuria with clots We discussed the differential diagnosis for microscopic hematuria including nephrolithiasis, renal or upper tract tumors, bladder stones, UTIs, or bladder tumors as well as undetermined etiologies. Per AUA guidelines, I did recommend complete microscopic hematuria evaluation including CTU, possible urine cytology, and office cystoscopy. - Urinalysis, Complete - BLADDER SCAN AMB NON-IMAGING - CT HEMATURIA WORKUP; Future  2. Prostate asymmetry Prostate asymmetry appreciated today, rubbery without significant nodules or pathology PSA is reassuring At this point time, I believe that there is no clear indication for prostate biopsy but will continue to follow him closely  3. BPH with obstruction/lower urinary tract symptoms Moderately symptomatic BPH Discussed trial of Flomax if this improves his urinary symptoms We will reassess at next follow-up visit  4. Incomplete bladder emptying Mildly elevated postvoid residual No personal history of retention We will continue to follow Flomax as above  Return in about 4 weeks (around 07/07/2018) for cysto, review CT urogram.  Hollice Espy, MD  [facility] [address] [place], [place] [address] [phone]

## 2018-06-09 NOTE — H&P (View-Only) (Signed)
06/09/2018 4:57 PM   Nathan Anthony Nov 05, 1955 [id number]  Referring provider: [name], MD [address] [place], [place] [address]  Chief Complaint  Patient presents with  . Benign Prostatic Hypertrophy    HPI: 62 yo M referred for further evaluation of abnormal rectal exam/prostamegaly.  Patient was noted to have an enlarged gland with asymmetry on rectal exam by his primary care physician.  PSA was 0.7 on 04/22/2018.  He reports today that over the past 10 years, he was had issues he referred to as a "morning weak bladder" which he explained as frequency for the first 3 hours after waking.  He has a weaker stream over the past several years and prolonged voiding.  No straining.  He sits to void by choice.  IPSS as below.      He has cut back on coffee in the past which did not make a difference.   He also reports a history of gross hematuria on and off for the past 3 years.  It is often after something strenuous.  He had an episode of painless gross hematuria following intercourse 1 month ago without clots.  Blood lasted for about 3 days.  He does have a positive urinalysis at his primary care physician's, Dr. [name] office.  He does smoke pipe daily.    PVR 102 cc  IPSS    Row Name 06/09/18 0900         International Prostate Symptom Score   How often have you had the sensation of not emptying your bladder?  More than half the time     How often have you had to urinate less than every two hours?  More than half the time     How often have you found you stopped and started again several times when you urinated?  About half the time     How often have you found it difficult to postpone urination?  Less than 1 in 5 times     How often have you had a weak urinary stream?  Almost always     How often have you had to strain to start urination?  Less than 1 in 5 times     How many times did you typically get up at night to urinate?  None     Total IPSS  Score  18       Quality of Life due to urinary symptoms   If you were to spend the rest of your life with your urinary condition just the way it is now how would you feel about that?  Mostly Disatisfied        Score:  1-7 Mild 8-19 Moderate 20-35 Severe   PMH: Past Medical History:  Diagnosis Date  . Allergy   . Asthma   . 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[facility])   . Chronic obstructive pulmonary disease ([facility]) 04/22/2018  . GERD (gastroesophageal reflux disease)   . Hyperlipidemia   . Osteoporosis   . RA (rheumatoid arthritis) ([facility])   . Sciatica     Surgical History: Past Surgical History:  Procedure Laterality Date  . BASAL CELL CARCINOMA EXCISION    . COLONOSCOPY  06/06/2010  . HERNIA REPAIR    . VARICOCELECTOMY    . VARICOCELECTOMY      Home Medications:  Allergies as of 06/09/2018      Reactions   Pravastatin    Other reaction(s): Nausea Only   Simvastatin    Other  reaction(s): Nausea Only      Medication List        Accurate as of 06/09/18  4:57 PM. Always use your most recent med list.          albuterol 108 (90 Base) MCG/ACT inhaler Commonly known as:  PROVENTIL HFA;VENTOLIN HFA Inhale 1 puff into the lungs every 6 (six) hours as needed for wheezing or shortness of breath.   alendronate 70 MG tablet Commonly known as:  FOSAMAX Take 70 mg by mouth once a week.   Azelastine-Fluticasone 137-50 MCG/ACT Susp Place 1 spray into the nose 2 (two) times daily.   calcium-vitamin D 250-100 MG-UNIT tablet Take 1 tablet by mouth 2 (two) times daily.   fluocinonide cream 0.05 % Commonly known as:  LIDEX Apply topically 2 (two) times daily.   HUMIRA PEN 40 MG/0.8ML Pnkt Generic drug:  Adalimumab Inject 40 mg into the skin every 14 (fourteen) days.   hydrocortisone-pramoxine rectal foam Commonly known as:  PROCTOFOAM-HC One application to rectal area twice a day as needed   loratadine 10 MG tablet Commonly known as:  CLARITIN Take 10 mg by mouth daily.    meloxicam 15 MG tablet Commonly known as:  MOBIC Take 15 mg by mouth daily.   montelukast 10 MG tablet Commonly known as:  SINGULAIR TAKE 1 TABLET DAILY   multivitamin capsule Take 1 capsule by mouth daily.   omeprazole 20 MG capsule Commonly known as:  PRILOSEC TAKE 1 CAPSULE DAILY   rosuvastatin 10 MG tablet Commonly known as:  CRESTOR TAKE 1 TABLET DAILY   tamsulosin 0.4 MG Caps capsule Commonly known as:  FLOMAX Take 1 capsule (0.4 mg total) by mouth daily.       Allergies:  Allergies  Allergen Reactions  . Pravastatin     Other reaction(s): Nausea Only  . Simvastatin     Other reaction(s): Nausea Only    Family History: Family History  Problem Relation Age of Onset  . Heart disease Mother   . Asthma Mother   . Heart disease Brother   . Ankylosing spondylitis Brother     Social History:  reports that he has quit smoking. His smoking use included pipe. He quit after 5.00 years of use. He has never used smokeless tobacco. He reports that he does not drink alcohol or use drugs.  ROS: UROLOGY Frequent Urination?: Yes Hard to postpone urination?: Yes Burning/pain with urination?: No Get up at night to urinate?: No Leakage of urine?: No Urine stream starts and stops?: Yes Trouble starting stream?: Yes Do you have to strain to urinate?: No Blood in urine?: Yes Urinary tract infection?: No Sexually transmitted disease?: No Injury to kidneys or bladder?: No Painful intercourse?: Yes Weak stream?: Yes Erection problems?: Yes Penile pain?: No  Gastrointestinal Nausea?: No Vomiting?: No Indigestion/heartburn?: No Diarrhea?: No Constipation?: No  Constitutional Fever: No Night sweats?: No Weight loss?: No Fatigue?: No  Skin Skin rash/lesions?: Yes Itching?: Yes  Eyes Blurred vision?: No Double vision?: No  Ears/Nose/Throat Sore throat?: No Sinus problems?: Yes  Hematologic/Lymphatic Swollen glands?: No Easy bruising?:  No  Cardiovascular Leg swelling?: No Chest pain?: No  Respiratory Cough?: Yes Shortness of breath?: Yes  Endocrine Excessive thirst?: No  Musculoskeletal Back pain?: Yes Joint pain?: Yes  Neurological Headaches?: No Dizziness?: No  Psychologic Depression?: No Anxiety?: No  Physical Exam: BP (!) 148/82 (BP Location: Left Arm, Patient Position: Sitting, Cuff Size: Normal)   Pulse 63   Ht 5\' 10"  (1.778 m)  Wt 184 lb (83.5 kg)   BMI 26.40 kg/m   Constitutional:  Alert and oriented, No acute distress. HEENT: [place] AT, moist mucus membranes.  Trachea midline, no masses. Cardiovascular: No clubbing, cyanosis, or edema. Respiratory: Normal respiratory effort, no increased work of breathing. GI: Abdomen is soft, nontender, nondistended, no abdominal masses GU: No CVA tenderness Rectal: + hemorroids.  Normal sphincter tone.  R>L lobe, rubbery, 40 g Lymph: No cervical or inguinal lymphadenopathy. Skin: No rashes, bruises or suspicious lesions. Neurologic: Grossly intact, no focal deficits, moving all 4 extremities. Psychiatric: Normal mood and affect.  Laboratory Data: Lab Results  Component Value Date   WBC 10.4 04/22/2018   HGB 15.0 04/22/2018   HCT 45.0 04/22/2018   MCV 88 04/22/2018   PLT 332 04/22/2018    Lab Results  Component Value Date   CREATININE 0.86 04/22/2018    Same as above  Urinalysis Results for orders placed or performed in visit on 06/09/18  Urinalysis, Complete  Result Value Ref Range   Specific Gravity, UA 1.010 1.005 - 1.030   pH, UA 7.0 5.0 - 7.5   Color, UA Yellow Yellow   Appearance Ur Clear Clear   Leukocytes, UA Negative Negative   Protein, UA Negative Negative/Trace   Glucose, UA Negative Negative   Ketones, UA Negative Negative   RBC, UA Negative Negative   Bilirubin, UA Negative Negative   Urobilinogen, Ur 0.2 0.2 - 1.0 mg/dL   Nitrite, UA Negative Negative  BLADDER SCAN AMB NON-IMAGING  Result Value Ref Range   Scan  Result 102     Assessment & Plan:    1. Gross hematuria Recurrent episodes of painless gross hematuria with clots We discussed the differential diagnosis for microscopic hematuria including nephrolithiasis, renal or upper tract tumors, bladder stones, UTIs, or bladder tumors as well as undetermined etiologies. Per AUA guidelines, I did recommend complete microscopic hematuria evaluation including CTU, possible urine cytology, and office cystoscopy. - Urinalysis, Complete - BLADDER SCAN AMB NON-IMAGING - CT HEMATURIA WORKUP; Future  2. Prostate asymmetry Prostate asymmetry appreciated today, rubbery without significant nodules or pathology PSA is reassuring At this point time, I believe that there is no clear indication for prostate biopsy but will continue to follow him closely  3. BPH with obstruction/lower urinary tract symptoms Moderately symptomatic BPH Discussed trial of Flomax if this improves his urinary symptoms We will reassess at next follow-up visit  4. Incomplete bladder emptying Mildly elevated postvoid residual No personal history of retention We will continue to follow Flomax as above  Return in about 4 weeks (around 07/07/2018) for cysto, review CT urogram.  Hollice Espy, MD  [facility] [address] [place], [place] [address] [phone]

## 2018-06-10 ENCOUNTER — Other Ambulatory Visit: Payer: Self-pay | Admitting: Internal Medicine

## 2018-06-10 DIAGNOSIS — J4530 Mild persistent asthma, uncomplicated: Secondary | ICD-10-CM

## 2018-06-16 ENCOUNTER — Telehealth: Payer: Self-pay | Admitting: Urology

## 2018-06-16 ENCOUNTER — Ambulatory Visit
Admission: RE | Admit: 2018-06-16 | Discharge: 2018-06-16 | Disposition: A | Discharge status: Home / Self Care | Source: Ambulatory Visit | Attending: Urology | Admitting: Urology

## 2018-06-16 DIAGNOSIS — R310 Gross hematuria: Secondary | ICD-10-CM | POA: Diagnosis not present

## 2018-06-16 DIAGNOSIS — N400 Enlarged prostate without lower urinary tract symptoms: Secondary | ICD-10-CM | POA: Diagnosis not present

## 2018-06-16 DIAGNOSIS — N2889 Other specified disorders of kidney and ureter: Secondary | ICD-10-CM | POA: Diagnosis not present

## 2018-06-16 DIAGNOSIS — K5730 Diverticulosis of large intestine without perforation or abscess without bleeding: Secondary | ICD-10-CM | POA: Insufficient documentation

## 2018-06-16 DIAGNOSIS — K469 Unspecified abdominal hernia without obstruction or gangrene: Secondary | ICD-10-CM | POA: Diagnosis not present

## 2018-06-16 LAB — POCT I-STAT CREATININE: Creatinine, Ser: 0.8 mg/dL (ref 0.61–1.24)

## 2018-06-16 MED ORDER — IOPAMIDOL (ISOVUE-300) INJECTION 61%
125.0000 mL | Freq: Once | INTRAVENOUS | Status: AC | PRN
  Administered 2018-06-16: 125 mL via INTRAVENOUS

## 2018-06-16 NOTE — Telephone Encounter (Signed)
Call patient today with a CT urogram results.  Unfortunately, it appears that he has a fairly large bladder tumor in the posterior bladder wall.  Findings were discussed with the patient.  I recommended proceeding to the operating room for TURBT in the near future with postoperative instillation of intravesical gemcitabine.  We discussed the risk at length today including the risk of bleeding, infection, damage surrounding structures, bladder perforation amongst others.  All questions were answered.  He is willing to proceed.  Hollice Espy, MD

## 2018-06-17 ENCOUNTER — Other Ambulatory Visit: Payer: Self-pay | Admitting: Radiology

## 2018-06-17 DIAGNOSIS — D494 Neoplasm of unspecified behavior of bladder: Secondary | ICD-10-CM

## 2018-06-18 ENCOUNTER — Other Ambulatory Visit: Payer: Self-pay

## 2018-06-18 ENCOUNTER — Encounter
Admission: RE | Admit: 2018-06-18 | Discharge: 2018-06-18 | Disposition: A | Discharge status: Home / Self Care | Source: Ambulatory Visit | Attending: Urology | Admitting: Urology

## 2018-06-18 DIAGNOSIS — Z01818 Encounter for other preprocedural examination: Secondary | ICD-10-CM | POA: Diagnosis not present

## 2018-06-18 HISTORY — DX: Other complications of anesthesia, initial encounter: T88.59XA

## 2018-06-18 HISTORY — DX: Rheumatoid arthritis, unspecified: M06.9

## 2018-06-18 HISTORY — DX: Adverse effect of unspecified anesthetic, initial encounter: T41.45XA

## 2018-06-18 HISTORY — DX: Ankylosing hyperostosis (forestier), site unspecified: M48.10

## 2018-06-18 HISTORY — DX: Other specified disorders of bone density and structure, unspecified site: M85.80

## 2018-06-18 HISTORY — DX: Unilateral inguinal hernia, without obstruction or gangrene, not specified as recurrent: K40.90

## 2018-06-18 HISTORY — DX: Tinnitus, left ear: H93.12

## 2018-06-18 LAB — URINALYSIS, ROUTINE W REFLEX MICROSCOPIC
BILIRUBIN URINE: NEGATIVE
Bacteria, UA: NONE SEEN
Glucose, UA: NEGATIVE mg/dL
Ketones, ur: NEGATIVE mg/dL
Leukocytes, UA: NEGATIVE
Nitrite: NEGATIVE
Protein, ur: NEGATIVE mg/dL
SQUAMOUS EPITHELIAL / LPF: NONE SEEN (ref 0–5)
Specific Gravity, Urine: 1.003 — ABNORMAL LOW (ref 1.005–1.030)
pH: 6 (ref 5.0–8.0)

## 2018-06-18 NOTE — Patient Instructions (Signed)
Your procedure is scheduled on: Monday 06/21/18.  Report to DAY SURGERY DEPARTMENT LOCATED ON 2ND FLOOR MEDICAL MALL ENTRANCE. To find out your arrival time please call [phone] between 1PM - 3PM Today  Remember: Instructions that are not followed completely may result in serious medical risk, up to and including death, or upon the discretion of your surgeon and anesthesiologist your surgery may need to be rescheduled.     _X__ 1. Do not eat food after midnight the night before your procedure.                 No gum chewing or hard candies. You may drink clear liquids up to 2 hours                 before you are scheduled to arrive for your surgery- DO NOT drink clear                 liquids within 2 hours of the start of your surgery.                 Clear Liquids include:  water, apple juice without pulp, clear carbohydrate                 drink such as Clearfast or Gatorade, Black Coffee or Tea (Do not add                 anything to coffee or tea).  __X__2.  On the morning of surgery brush your teeth with toothpaste and water, you may rinse your mouth with mouthwash if you wish.  Do not swallow any toothpaste or mouthwash.     _X__ 3.  No Alcohol for 24 hours before or after surgery.   _X__ 4.  Do Not Smoke or use e-cigarettes For 24 Hours Prior to Your Surgery.                 Do not use any chewable tobacco products for at least 6 hours prior to                 surgery.   __X__ 5.  Notify your doctor if there is any change in your medical condition      (cold, fever, infections).     Do not wear jewelry, make-up, hairpins, clips or nail polish. Do not wear lotions, powders, or perfumes.  Do not shave 48 hours prior to surgery. Men may shave face and neck. Do not bring valuables to the hospital.    [facility] is not responsible for any belongings or valuables.  Contacts, dentures/partials or body piercings may not be worn into surgery. Bring a case for your contacts,  glasses or hearing aids, a denture cup will be supplied. Leave your suitcase in the car. After surgery it may be brought to your room. For patients admitted to the hospital, discharge time is determined by your treatment team.   Patients discharged the day of surgery will not be allowed to drive home.   Please read over the following fact sheets that you were given:   MRSA Information  __X__ Take these medicines the morning of surgery with A SIP OF WATER:     1. albuterol (PROVENTIL HFA;VENTOLIN HFA) 108 (90 Base) MCG/ACT inhaler  2. Azelastine-Fluticasone (DYMISTA) 137-50 MCG/ACT SUSP  3. loratadine (CLARITIN) 10 MG tablet  4. omeprazole (PRILOSEC) 20 MG capsule  5.  6.    _ X___ Use inhalers on the day of surgery.  Also bring the inhaler with you to the hospital on the morning of surgery.   __X__ Stop Anti-inflammatories 7 days before surgery such as Advil, Ibuprofen, Motrin, BC or Goodies Powder, Naprosyn, Naproxen, Aleve, Aspirin, Meloxicam. May take Tylenol if needed for pain or discomfort.   __X__ Stop all herbal supplements.

## 2018-06-19 LAB — URINE CULTURE: Culture: NO GROWTH

## 2018-06-20 MED ORDER — GEMCITABINE HCL CHEMO INJECTION 1 GM
2000.0000 mg | Freq: Once | INTRAVENOUS | Status: DC
  Filled 2018-06-20: qty 53

## 2018-06-20 MED ORDER — CEFAZOLIN SODIUM-DEXTROSE 2-4 GM/100ML-% IV SOLN
2.0000 g | INTRAVENOUS | Status: AC
  Administered 2018-06-21: 2 g via INTRAVENOUS

## 2018-06-21 ENCOUNTER — Ambulatory Visit
Admission: RE | Admit: 2018-06-21 | Discharge: 2018-06-21 | Disposition: A | Discharge status: Home / Self Care | Source: Ambulatory Visit | Attending: Urology | Admitting: Urology

## 2018-06-21 ENCOUNTER — Encounter
Admission: RE | Disposition: A | Discharge status: Home / Self Care | Payer: Self-pay | Source: Ambulatory Visit | Attending: Urology

## 2018-06-21 ENCOUNTER — Ambulatory Visit: Admitting: Anesthesiology

## 2018-06-21 DIAGNOSIS — M4810 Ankylosing hyperostosis [Forestier], site unspecified: Secondary | ICD-10-CM | POA: Diagnosis not present

## 2018-06-21 DIAGNOSIS — J8410 Pulmonary fibrosis, unspecified: Secondary | ICD-10-CM | POA: Diagnosis not present

## 2018-06-21 DIAGNOSIS — M069 Rheumatoid arthritis, unspecified: Secondary | ICD-10-CM | POA: Diagnosis not present

## 2018-06-21 DIAGNOSIS — C674 Malignant neoplasm of posterior wall of bladder: Secondary | ICD-10-CM | POA: Insufficient documentation

## 2018-06-21 DIAGNOSIS — Z85828 Personal history of other malignant neoplasm of skin: Secondary | ICD-10-CM | POA: Insufficient documentation

## 2018-06-21 DIAGNOSIS — Z888 Allergy status to other drugs, medicaments and biological substances status: Secondary | ICD-10-CM | POA: Insufficient documentation

## 2018-06-21 DIAGNOSIS — M5430 Sciatica, unspecified side: Secondary | ICD-10-CM | POA: Diagnosis not present

## 2018-06-21 DIAGNOSIS — K219 Gastro-esophageal reflux disease without esophagitis: Secondary | ICD-10-CM | POA: Diagnosis not present

## 2018-06-21 DIAGNOSIS — N029 Recurrent and persistent hematuria with unspecified morphologic changes: Secondary | ICD-10-CM | POA: Diagnosis not present

## 2018-06-21 DIAGNOSIS — R339 Retention of urine, unspecified: Secondary | ICD-10-CM | POA: Insufficient documentation

## 2018-06-21 DIAGNOSIS — D494 Neoplasm of unspecified behavior of bladder: Secondary | ICD-10-CM | POA: Diagnosis not present

## 2018-06-21 DIAGNOSIS — N401 Enlarged prostate with lower urinary tract symptoms: Secondary | ICD-10-CM | POA: Diagnosis not present

## 2018-06-21 DIAGNOSIS — Z79899 Other long term (current) drug therapy: Secondary | ICD-10-CM | POA: Insufficient documentation

## 2018-06-21 DIAGNOSIS — Z87891 Personal history of nicotine dependence: Secondary | ICD-10-CM | POA: Diagnosis not present

## 2018-06-21 DIAGNOSIS — M8580 Other specified disorders of bone density and structure, unspecified site: Secondary | ICD-10-CM | POA: Diagnosis not present

## 2018-06-21 DIAGNOSIS — J45909 Unspecified asthma, uncomplicated: Secondary | ICD-10-CM | POA: Insufficient documentation

## 2018-06-21 DIAGNOSIS — Z9889 Other specified postprocedural states: Secondary | ICD-10-CM | POA: Diagnosis not present

## 2018-06-21 DIAGNOSIS — E785 Hyperlipidemia, unspecified: Secondary | ICD-10-CM | POA: Diagnosis not present

## 2018-06-21 DIAGNOSIS — R350 Frequency of micturition: Secondary | ICD-10-CM | POA: Insufficient documentation

## 2018-06-21 DIAGNOSIS — R3912 Poor urinary stream: Secondary | ICD-10-CM | POA: Insufficient documentation

## 2018-06-21 DIAGNOSIS — J449 Chronic obstructive pulmonary disease, unspecified: Secondary | ICD-10-CM | POA: Insufficient documentation

## 2018-06-21 DIAGNOSIS — M810 Age-related osteoporosis without current pathological fracture: Secondary | ICD-10-CM | POA: Insufficient documentation

## 2018-06-21 DIAGNOSIS — M1990 Unspecified osteoarthritis, unspecified site: Secondary | ICD-10-CM | POA: Diagnosis not present

## 2018-06-21 DIAGNOSIS — N329 Bladder disorder, unspecified: Secondary | ICD-10-CM | POA: Diagnosis present

## 2018-06-21 HISTORY — PX: TRANSURETHRAL RESECTION OF BLADDER TUMOR WITH MITOMYCIN-C: SHX6459

## 2018-06-21 SURGERY — TRANSURETHRAL RESECTION OF BLADDER TUMOR WITH MITOMYCIN-C
Anesthesia: General

## 2018-06-21 MED ORDER — FENTANYL CITRATE (PF) 100 MCG/2ML IJ SOLN
INTRAMUSCULAR | Status: AC
  Filled 2018-06-21: qty 2

## 2018-06-21 MED ORDER — EPHEDRINE SULFATE 50 MG/ML IJ SOLN
INTRAMUSCULAR | Status: DC | PRN
  Administered 2018-06-21 (×2): 7.5 mg via INTRAVENOUS
  Administered 2018-06-21: 5 mg via INTRAVENOUS

## 2018-06-21 MED ORDER — BELLADONNA ALKALOIDS-OPIUM 16.2-60 MG RE SUPP
1.0000 | Freq: Every day | RECTAL | Status: DC
  Administered 2018-06-21: 1 via RECTAL

## 2018-06-21 MED ORDER — LACTATED RINGERS IV SOLN
INTRAVENOUS | Status: DC
  Administered 2018-06-21: 12:00:00 via INTRAVENOUS

## 2018-06-21 MED ORDER — PROPOFOL 10 MG/ML IV BOLUS
INTRAVENOUS | Status: DC | PRN
  Administered 2018-06-21: 150 mg via INTRAVENOUS

## 2018-06-21 MED ORDER — CEFAZOLIN SODIUM-DEXTROSE 2-4 GM/100ML-% IV SOLN
INTRAVENOUS | Status: AC
  Filled 2018-06-21: qty 100

## 2018-06-21 MED ORDER — LIDOCAINE HCL (CARDIAC) PF 100 MG/5ML IV SOSY
PREFILLED_SYRINGE | INTRAVENOUS | Status: DC | PRN
  Administered 2018-06-21: 80 mg via INTRAVENOUS

## 2018-06-21 MED ORDER — GEMCITABINE CHEMO FOR BLADDER INSTILLATION 2000 MG
INTRAVENOUS | Status: DC | PRN
  Administered 2018-06-21: 2000 mg via INTRAVESICAL

## 2018-06-21 MED ORDER — PROMETHAZINE HCL 25 MG/ML IJ SOLN: 6.2500 mg | INTRAMUSCULAR | Status: DC | PRN

## 2018-06-21 MED ORDER — PHENYLEPHRINE HCL 10 MG/ML IJ SOLN
INTRAMUSCULAR | Status: DC | PRN
  Administered 2018-06-21 (×2): 100 ug via INTRAVENOUS

## 2018-06-21 MED ORDER — HYDROCODONE-ACETAMINOPHEN 5-325 MG PO TABS: 1.0000 | ORAL_TABLET | Freq: Four times a day (QID) | ORAL | 0 refills | Status: DC | PRN

## 2018-06-21 MED ORDER — ONDANSETRON HCL 4 MG/2ML IJ SOLN
INTRAMUSCULAR | Status: DC | PRN
  Administered 2018-06-21: 4 mg via INTRAVENOUS

## 2018-06-21 MED ORDER — OXYBUTYNIN CHLORIDE 5 MG PO TABS: 5.0000 mg | ORAL_TABLET | Freq: Three times a day (TID) | ORAL | 0 refills | Status: DC | PRN

## 2018-06-21 MED ORDER — MEPERIDINE HCL 50 MG/ML IJ SOLN: 6.2500 mg | INTRAMUSCULAR | Status: DC | PRN

## 2018-06-21 MED ORDER — DEXAMETHASONE SODIUM PHOSPHATE 10 MG/ML IJ SOLN
INTRAMUSCULAR | Status: DC | PRN
  Administered 2018-06-21: 5 mg via INTRAVENOUS

## 2018-06-21 MED ORDER — OXYCODONE HCL 5 MG/5ML PO SOLN: 5.0000 mg | Freq: Once | ORAL | Status: DC | PRN

## 2018-06-21 MED ORDER — FENTANYL CITRATE (PF) 100 MCG/2ML IJ SOLN: 25.0000 ug | INTRAMUSCULAR | Status: DC | PRN

## 2018-06-21 MED ORDER — ACETAMINOPHEN 10 MG/ML IV SOLN
INTRAVENOUS | Status: AC
  Filled 2018-06-21: qty 100

## 2018-06-21 MED ORDER — FENTANYL CITRATE (PF) 100 MCG/2ML IJ SOLN
INTRAMUSCULAR | Status: DC | PRN
  Administered 2018-06-21 (×2): 50 ug via INTRAVENOUS

## 2018-06-21 MED ORDER — BELLADONNA ALKALOIDS-OPIUM 16.2-60 MG RE SUPP
RECTAL | Status: AC
  Filled 2018-06-21: qty 1

## 2018-06-21 MED ORDER — OXYCODONE HCL 5 MG PO TABS: 5.0000 mg | ORAL_TABLET | Freq: Once | ORAL | Status: DC | PRN

## 2018-06-21 SURGICAL SUPPLY — 27 items
BAG DRAIN CYSTO-URO LG1000N (MISCELLANEOUS) ×3 IMPLANT
BAG URINE DRAINAGE (UROLOGICAL SUPPLIES) ×3 IMPLANT
BRUSH SCRUB EZ  4% CHG (MISCELLANEOUS) ×2
BRUSH SCRUB EZ 4% CHG (MISCELLANEOUS) ×1 IMPLANT
CATH FOLEY 2WAY  5CC 16FR (CATHETERS) ×2
CATH URTH 16FR FL 2W BLN LF (CATHETERS) ×1 IMPLANT
COVER WAND RF STERILE (DRAPES) ×3 IMPLANT
CRADLE LAMINECT ARM (MISCELLANEOUS) ×3 IMPLANT
DRAPE UTILITY 15X26 TOWEL STRL (DRAPES) ×3 IMPLANT
DRSG TELFA 4X3 1S NADH ST (GAUZE/BANDAGES/DRESSINGS) ×3 IMPLANT
ELECT LOOP 22F BIPOLAR SML (ELECTROSURGICAL)
ELECT REM PT RETURN 9FT ADLT (ELECTROSURGICAL)
ELECTRODE LOOP 22F BIPOLAR SML (ELECTROSURGICAL) IMPLANT
ELECTRODE REM PT RTRN 9FT ADLT (ELECTROSURGICAL) IMPLANT
GLOVE BIO SURGEON STRL SZ 6.5 (GLOVE) ×2 IMPLANT
GLOVE BIO SURGEONS STRL SZ 6.5 (GLOVE) ×1
GOWN STRL REUS W/ TWL LRG LVL3 (GOWN DISPOSABLE) ×2 IMPLANT
GOWN STRL REUS W/TWL LRG LVL3 (GOWN DISPOSABLE) ×4
KIT TURNOVER CYSTO (KITS) ×3 IMPLANT
LOOP CUT BIPOLAR 24F LRG (ELECTROSURGICAL) ×3 IMPLANT
NDL SAFETY ECLIPSE 18X1.5 (NEEDLE) ×1 IMPLANT
NEEDLE HYPO 18GX1.5 SHARP (NEEDLE) ×2
PACK CYSTO AR (MISCELLANEOUS) ×3 IMPLANT
SET IRRIG Y TYPE TUR BLADDER L (SET/KITS/TRAYS/PACK) ×3 IMPLANT
SURGILUBE 2OZ TUBE FLIPTOP (MISCELLANEOUS) ×3 IMPLANT
SYRINGE IRR TOOMEY STRL 70CC (SYRINGE) ×3 IMPLANT
WATER STERILE IRR 1000ML POUR (IV SOLUTION) ×3 IMPLANT

## 2018-06-21 NOTE — Discharge Instructions (Signed)
Transurethral Resection of the Prostate, Care After °Refer to this sheet in the next few weeks. These instructions provide you with information about caring for yourself after your procedure. Your health care provider may also give you more specific instructions. Your treatment has been planned according to current medical practices, but problems sometimes occur. Call your health care provider if you have any problems or questions after your procedure. °What can I expect after the procedure? °After the procedure, it is common to have: °· Mild pain in your lower abdomen. °· Soreness or mild discomfort in your penis from having the catheter inserted during the procedure. °· A feeling of urgency when you need to urinate. °· A small amount of blood in your urine. You may notice some small blood clots in your urine. These are normal. ° °Follow these instructions at home: °Medicines ° °· Take over-the-counter and prescription medicines only as told by your health care provider. °· Do not drive or operate heavy machinery while taking prescription pain medicine. °· Do not drive for 24 hours if you received a sedative. °· If you were prescribed antibiotic medicine, take it as told by your health care provider. Do not stop taking the antibiotic even if you start to feel better. °Activity °· Return to your normal activities as told by your health care provider. Ask your health care provider what activities are safe for you. °· Do not lift anything that is heavier than 10 lb (4.5 kg) for 3 weeks after your procedure, or as long as told by your health care provider. °· Avoid intense physical activity for as long as told by your health care provider. °· Walk at least one time every day. This helps to prevent blood clots. You may increase your physical activity gradually as you start to feel better. °Lifestyle °· Do not drink alcohol for as long as told by your health care provider. This is especially important if you are taking  prescription pain medicines. °· Do not engage in sexual activity until your health care provider says that you can do this. °General instructions °· Do not take baths, swim, or use a hot tub until your health care provider approves. °· Drink enough fluid to keep your urine clear or pale yellow. °· Urinate as soon as you feel the need to. Do not try to hold your urine for long periods of time. °· If your health care provider approves, you may take a stool softener for 2-3 weeks to prevent you from straining to have a bowel movement. °· Wear compression stockings as told by your health care provider. These stockings help to prevent blood clots and reduce swelling in your legs. °· Keep all follow-up visits as told by your health care provider. This is important. °Contact a health care provider if: °· You have difficulty urinating. °· You have a fever. °· You have pain that gets worse or does not improve with medicine. °· You have blood in your urine that does not go away after 1 week of resting and drinking more fluids. °· You have swelling in your penis or testicles. °Get help right away if: °· You are unable to urinate. °· You are having more blood clots in your urine instead of fewer. °· You have: °? Large blood clots. °? A lot of blood in your urine. °? Pain in your back or lower abdomen. °? Pain or swelling in your legs. °? Chills and you are shaking. °This information is not intended to   replace advice given to you by your health care provider. Make sure you discuss any questions you have with your health care provider. Document Released: 08/18/2005 Document Revised: 04/20/2016 Document Reviewed: 05/10/2015 Elsevier Interactive Patient Education  2017 [employer]   1) The drugs that you were given will stay in your system until tomorrow so for the next 24 hours you should not:  A) Drive an automobile B) Make any legal decisions C) Drink any alcoholic  beverage   2) You may resume regular meals tomorrow.  Today it is better to start with liquids and gradually work up to solid foods.  You may eat anything you prefer, but it is better to start with liquids, then soup and crackers, and gradually work up to solid foods.   3) Please notify your doctor immediately if you have any unusual bleeding, trouble breathing, redness and pain at the surgery site, drainage, fever, or pain not relieved by medication.    4) Additional Instructions:        Please contact your physician with any problems or Same Day Surgery at [phone], Monday through Friday 6 am to 4 pm, or [place] at [facility] number at [phone].

## 2018-06-21 NOTE — Op Note (Signed)
Date of procedure: 06/21/18  Preoperative diagnosis:  1. Bladder mass  Postoperative diagnosis:  1. Same as above 2. BPH  Procedure: 1. Cystoscopy 2. TURBT (medium) 3. 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  EBL: Minimal  Specimens: Bladder tumor, deep place of bladder tumor  Drains: 16 French Foley catheter  Indication: Nathan Anthony is a 62 y.o. patient with gross and microscopic hematuria who underwent CT urogram for further evaluation of this.  He is found to have an incidental bladder mass on CT scan this was counseled to undergo TURBT in the operating room.  After reviewing the management options for treatment, he elected to proceed with the above surgical procedure(s). We have discussed the potential benefits and risks of the procedure, side effects of the proposed treatment, the likelihood of the patient achieving the goals of the procedure, and any potential problems that might occur during the procedure or recuperation. Informed consent has been obtained.  Description of procedure:  The patient was taken to the operating room and general anesthesia was induced.  The patient was placed in the dorsal lithotomy position, prepped and draped in the usual sterile fashion, and preoperative antibiotics were administered. A preoperative time-out was performed.   A 21 French scope was advanced per urethra into the bladder.  Notably, he did prostamegaly with bilobar coaptation and a significantly elevated bladder neck with a intravesical component to his prostate.  Notably, his prostatic length was relatively short, approximately 3 cm.  The bladder itself was mildly trabeculated.  There is a spherical, 2.5 cm papillary tumor on the left  posterior bladder wall with surrounding hypervascularity, papillary fine changes.  The remainder of the bladder was carefully inspected and there is no additional tumors identified other than the aforementioned.  The UOs were normal with clear reflux of urine from both in a good distance from the tumor.  At this point time, the scope was exchanged for a 26 French resectoscope and using the bipolar loop, the tumor was taken down to its base using saline as the medium.  Notably, the core of the tumor was relatively solid whereas the surface was frondular.  Upon reaching the base of the tumor, cold cup biopsy forceps were used to take some deeper bites which is passed off as a separate specimen, deep base of tumor.  Finally, the periphery as well as base of the tumor was then fulgurated until no visible residual tumor remained.  The bladder was then irrigated and all chips were evacuated.  Adequate hemostasis was achieved.  Finally, a 63 French Foley catheter was advanced into the bladder.  The balloon was filled with 10 cc of sterile water.  The patient was then clean and dry, repositioned in supine position, reversed from anesthesia, taken to the PACU in stable condition.  2000 mg of intravesical gemcitabine was instilled into the bladder and allowed to dwell for a total of 1 hour.  This is well-tolerated and after an hour, the chemotherapy was drained and the catheter was removed.    Plan: I will call patient with his pathology results.  He is scheduled to see me in a few weeks, we will just this follow-up as needed based on his pathology results.  Hollice Espy, MD   Hollice Espy, M.D.

## 2018-06-21 NOTE — Anesthesia Preprocedure Evaluation (Signed)
Anesthesia Evaluation  Patient identified by MRN, date of birth, ID band Patient awake    Reviewed: Allergy & Precautions, NPO status , Patient's Chart, lab work & pertinent test results  History of Anesthesia Complications (+) history of anesthetic complications (difficulty breathing after colonoscopy)  Airway Mallampati: II  TM Distance: >3 FB Neck ROM: Full    Dental no notable dental hx.    Pulmonary asthma , neg sleep apnea, COPD,  COPD inhaler, former smoker,    breath sounds clear to auscultation- rhonchi (-) wheezing      Cardiovascular Exercise Tolerance: Good (-) hypertension(-) CAD, (-) Past MI, (-) Cardiac Stents and (-) CABG  Rhythm:Regular Rate:Normal - Systolic murmurs and - Diastolic murmurs    Neuro/Psych negative neurological ROS  negative psych ROS   GI/Hepatic Neg liver ROS, GERD  ,  Endo/Other  negative endocrine ROSneg diabetes  Renal/GU negative Renal ROS     Musculoskeletal  (+) Arthritis ,   Abdominal (+) - obese,   Peds  Hematology negative hematology ROS (+)   Anesthesia Other Findings Past Medical History: No date: Allergy No date: Asthma No date: 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[place]) 04/22/2018: Chronic obstructive pulmonary disease ([facility]) No date: Complication of anesthesia     Comment:  Asthmatic episode when awakening from colonoscopy No date: DISH (diffuse idiopathic skeletal hyperostosis) No date: GERD (gastroesophageal reflux disease) No date: Hyperlipidemia No date: Hyperlipidemia No date: Inguinal hernia     Comment:  Left Side No date: Osteopenia No date: Osteoporosis No date: RA (rheumatoid arthritis) ([place]) No date: Rheumatoid arthritis ([place]) No date: Sciatica No date: Sciatica No date: Sciatica No date: Tinnitus aurium, left   Reproductive/Obstetrics                             Anesthesia Physical Anesthesia Plan  ASA: II  Anesthesia  Plan: General   Post-op Pain Management:    Induction: Intravenous  PONV Risk Score and Plan: 1 and Ondansetron and Midazolam  Airway Management Planned: LMA  Additional Equipment:   Intra-op Plan:   Post-operative Plan:   Informed Consent: I have reviewed the patients History and Physical, chart, labs and discussed the procedure including the risks, benefits and alternatives for the proposed anesthesia with the patient or authorized representative who has indicated his/her understanding and acceptance.   Dental advisory given  Plan Discussed with: CRNA and Anesthesiologist  Anesthesia Plan Comments:         Anesthesia Quick Evaluation

## 2018-06-21 NOTE — Anesthesia Procedure Notes (Signed)
Procedure Name: LMA Insertion Date/Time: 06/21/2018 11:37 AM Performed by: Chanetta Marshall, CRNA Pre-anesthesia Checklist: Patient identified, Emergency Drugs available, Suction available and Patient being monitored Patient Re-evaluated:Patient Re-evaluated prior to induction Oxygen Delivery Method: Circle system utilized Preoxygenation: Pre-oxygenation with 100% oxygen Induction Type: IV induction Ventilation: Mask ventilation without difficulty LMA: LMA inserted LMA Size: 4.0 Number of attempts: 1 Placement Confirmation: positive ETCO2,  CO2 detector and breath sounds checked- equal and bilateral Tube secured with: Tape Dental Injury: Teeth and Oropharynx as per pre-operative assessment

## 2018-06-21 NOTE — Transfer of Care (Signed)
Immediate Anesthesia Transfer of Care Note  Patient: Nathan Anthony  Procedure(s) Performed: TRANSURETHRAL RESECTION OF BLADDER TUMOR WITH Gemcitabine (N/A )  Patient Location: PACU  Anesthesia Type:General  Level of Consciousness: awake, alert , oriented and patient cooperative  Airway & Oxygen Therapy: Patient Spontanous Breathing  Post-op Assessment: Report given to RN, Post -op Vital signs reviewed and stable and Patient moving all extremities  Post vital signs: Reviewed and stable  Last Vitals:  Vitals Value Taken Time  BP 131/70 06/21/2018 12:29 PM  Temp    Pulse 77 06/21/2018 12:31 PM  Resp 17 06/21/2018 12:31 PM  SpO2 93 % 06/21/2018 12:31 PM  Vitals shown include unvalidated device data.  Last Pain:  Vitals:   06/21/18 1051  TempSrc: Oral         Complications: No apparent anesthesia complications

## 2018-06-21 NOTE — Interval H&P Note (Signed)
History and Physical Interval Note:  06/21/2018 11:17 AM  Nathan Anthony  has presented today for surgery, with the diagnosis of bladder tumor  The various methods of treatment have been discussed with the patient and family. After consideration of risks, benefits and other options for treatment, the patient has consented to  Procedure(s): TRANSURETHRAL RESECTION OF BLADDER TUMOR WITH Gemcitabine (N/A) as a surgical intervention .  The patient's history has been reviewed, patient examined, no change in status, stable for surgery.  I have reviewed the patient's chart and labs.  Questions were answered to the patient's satisfaction.    Patient underwent CT urogram which showed evidence of a bladder tumor along the posterior wall.  He was counseled by telephone to undergo TURBT with instillation of intravesical chemotherapy.  Risk and benefits were previously discussed.  All questions answered again today.  RRR CTAB  Hollice Espy

## 2018-06-21 NOTE — Anesthesia Post-op Follow-up Note (Signed)
Anesthesia QCDR form completed.        

## 2018-06-21 NOTE — Anesthesia Postprocedure Evaluation (Signed)
Anesthesia Post Note  Patient: Nathan Anthony  Procedure(s) Performed: TRANSURETHRAL RESECTION OF BLADDER TUMOR WITH Gemcitabine (N/A )  Patient location during evaluation: PACU Anesthesia Type: General Level of consciousness: awake and alert and oriented Pain management: pain level controlled Vital Signs Assessment: post-procedure vital signs reviewed and stable Respiratory status: spontaneous breathing, nonlabored ventilation and respiratory function stable Cardiovascular status: blood pressure returned to baseline and stable Postop Assessment: no signs of nausea or vomiting Anesthetic complications: no     Last Vitals:  Vitals:   06/21/18 1423 06/21/18 1506  BP: 132/81 133/70  Pulse: 68 67  Resp: 18 18  Temp: (!) 36.2 C   SpO2: 98% 98%    Last Pain:  Vitals:   06/21/18 1423  TempSrc: Temporal  PainSc: 0-No pain                 Garrus Gauthreaux

## 2018-06-23 ENCOUNTER — Telehealth: Payer: Self-pay | Admitting: Family Medicine

## 2018-06-23 LAB — SURGICAL PATHOLOGY

## 2018-06-23 NOTE — Telephone Encounter (Signed)
LMOM for patient to return call.

## 2018-06-23 NOTE — Telephone Encounter (Signed)
-----   Message from Hollice Espy, MD sent at 06/23/2018  3:14 PM EDT ----- Please let this patient know that his pathology does show bladder cancer but it is noninvasive (good news).  We will discuss his options further at his scheduled follow-up visit.  Please keep this appointment.  Hollice Espy, MD

## 2018-06-24 NOTE — Telephone Encounter (Signed)
Patient notified and voiced understanding.

## 2018-06-25 ENCOUNTER — Ambulatory Visit: Admitting: Internal Medicine

## 2018-07-14 ENCOUNTER — Ambulatory Visit (INDEPENDENT_AMBULATORY_CARE_PROVIDER_SITE_OTHER): Admitting: Urology

## 2018-07-14 ENCOUNTER — Encounter: Payer: Self-pay | Admitting: Urology

## 2018-07-14 ENCOUNTER — Other Ambulatory Visit: Payer: Self-pay

## 2018-07-14 VITALS — BP 157/91 | HR 88 | Ht 70.0 in | Wt 191.0 lb

## 2018-07-14 DIAGNOSIS — R339 Retention of urine, unspecified: Secondary | ICD-10-CM

## 2018-07-14 DIAGNOSIS — N401 Enlarged prostate with lower urinary tract symptoms: Secondary | ICD-10-CM | POA: Diagnosis not present

## 2018-07-14 DIAGNOSIS — N138 Other obstructive and reflux uropathy: Secondary | ICD-10-CM | POA: Diagnosis not present

## 2018-07-14 DIAGNOSIS — C672 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: Secondary | ICD-10-CM

## 2018-07-14 LAB — MICROSCOPIC EXAMINATION
Bacteria, UA: NONE SEEN
Epithelial Cells (non renal): NONE SEEN /hpf (ref 0–10)
RBC, UA: NONE SEEN /hpf (ref 0–2)

## 2018-07-14 LAB — URINALYSIS, COMPLETE
Bilirubin, UA: NEGATIVE
Glucose, UA: NEGATIVE
Ketones, UA: NEGATIVE
Nitrite, UA: NEGATIVE
PROTEIN UA: NEGATIVE
RBC, UA: NEGATIVE
Specific Gravity, UA: 1.01 (ref 1.005–1.030)
Urobilinogen, Ur: 0.2 mg/dL (ref 0.2–1.0)
pH, UA: 6 (ref 5.0–7.5)

## 2018-07-14 NOTE — Progress Notes (Signed)
07/14/2018 4:48 PM   Nathan Anthony 05/26/1956 [id number]  Referring provider: [name], MD [address] [place], [place] [address]  Chief Complaint  Patient presents with  . Procedure    HPI: 62 year old male with newly diagnosed bladder cancer who returns today following TURBT to discuss his surgical pathology.  He was initially seen and evaluated for an abnormal rectal exam but also noted episodes of painless gross hematuria as well as microscopic hematuria.  He underwent CT urogram which was suspicious for bladder tumor.  He was seen in the operating room on 06/21/2018 for TURBT, instillation of intravesical gemcitabine found to have a 2.5 cm spherical papillary tumor involving the left posterior base of the bladder.  Surgical pathology was consistent with pTa disease, primarily low-grade with 5% high-grade involvement.  Muscle was present and not involved.  He also has a personal history of obstructive urinary symptoms started on Flomax preoperatively.  He has noticed an improvement in his AM urinary symptoms, primarily in the AM with better flow and less frequency.  He feels like he emptying better.    He is please with Flomax.    He does have increased urgency since surgery which is improving.    No gross hematuria or dysuria.    PMH: Past Medical History:  Diagnosis Date  . Allergy   . Asthma   . 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 Chronic obstructive pulmonary disease ([place]) 04/22/2018  . Complication of anesthesia    Asthmatic episode when awakening from colonoscopy  . DISH (diffuse idiopathic skeletal hyperostosis)   . GERD (gastroesophageal reflux disease)   . Hyperlipidemia   . Hyperlipidemia   . Inguinal hernia    Left Side  . Osteopenia   . Osteoporosis   . RA (rheumatoid arthritis) ([place])   . Rheumatoid arthritis ([place])   . Sciatica   . Sciatica   . Sciatica   . Tinnitus aurium, left     Surgical History: Past Surgical  History:  Procedure Laterality Date  . BASAL CELL CARCINOMA EXCISION    . COLONOSCOPY  06/06/2010  . HERNIA REPAIR    . TRANSURETHRAL RESECTION OF BLADDER TUMOR WITH MITOMYCIN-C N/A 06/21/2018   Procedure: TRANSURETHRAL RESECTION OF BLADDER TUMOR WITH Gemcitabine;  Surgeon: Hollice Espy, MD;  Location: [facility] ORS;  Service: Urology;  Laterality: N/A;  . VARICOCELECTOMY    . VARICOCELECTOMY      Home Medications:  Allergies as of 07/14/2018      Reactions   Pravastatin    Other reaction(s): Nausea Only   Simvastatin    Other reaction(s): Nausea Only      Medication List        Accurate as of 07/14/18 11:59 PM. Always use your most recent med list.          albuterol 108 (90 Base) MCG/ACT inhaler Commonly known as:  PROVENTIL HFA;VENTOLIN HFA Inhale 1 puff into the lungs every 6 (six) hours as needed for wheezing or shortness of breath.   alendronate 70 MG tablet Commonly known as:  FOSAMAX Take 70 mg by mouth once a week.   Azelastine-Fluticasone 137-50 MCG/ACT Susp Place 1 spray into the nose 2 (two) times daily.   calcium-vitamin D 250-100 MG-UNIT tablet Take 1 tablet by mouth 2 (two) times daily.   fluocinonide cream 0.05 % Commonly known as:  LIDEX Apply topically 2 (two) times daily.   HUMIRA PEN 40 MG/0.8ML Pnkt Generic drug:  Adalimumab Inject 40  mg into the skin every 14 (fourteen) days.   hydrocortisone-pramoxine rectal foam Commonly known as:  PROCTOFOAM-HC One application to rectal area twice a day as needed   loratadine 10 MG tablet Commonly known as:  CLARITIN Take 10 mg by mouth daily.   meloxicam 15 MG tablet Commonly known as:  MOBIC Take 15 mg by mouth daily.   montelukast 10 MG tablet Commonly known as:  SINGULAIR TAKE 1 TABLET DAILY   multivitamin capsule Take 1 capsule by mouth daily.   omeprazole 20 MG capsule Commonly known as:  PRILOSEC TAKE 1 CAPSULE DAILY   rosuvastatin 10 MG tablet Commonly known as:  CRESTOR TAKE 1  TABLET DAILY   tamsulosin 0.4 MG Caps capsule Commonly known as:  FLOMAX Take 1 capsule (0.4 mg total) by mouth daily.       Allergies:  Allergies  Allergen Reactions  . Pravastatin     Other reaction(s): Nausea Only  . Simvastatin     Other reaction(s): Nausea Only    Family History: Family History  Problem Relation Age of Onset  . Heart disease Mother   . Asthma Mother   . Heart disease Brother   . Ankylosing spondylitis Brother     Social History:  reports that he has quit smoking. His smoking use included pipe. He quit after 5.00 years of use. He has never used smokeless tobacco. He reports that he does not drink alcohol or use drugs.  ROS: 12 point review systems is negative other than as per HPI.   Physical Exam: BP (!) 157/91   Pulse 88   Ht 5\' 10"  (1.778 m)   Wt 191 lb (86.6 kg)   BMI 27.41 kg/m   Constitutional:  Alert and oriented, No acute distress. HEENT: [place] AT, moist mucus membranes.  Trachea midline, no masses. Cardiovascular: No clubbing, cyanosis, or edema. Respiratory: Normal respiratory effort, no increased work of breathing. Skin: No rashes, bruises or suspicious lesions. Neurologic: Grossly intact, no focal deficits, moving all 4 extremities. Psychiatric: Normal mood and affect.  Laboratory Data: Lab Results  Component Value Date   WBC 10.4 04/22/2018   HGB 15.0 04/22/2018   HCT 45.0 04/22/2018   MCV 88 04/22/2018   PLT 332 04/22/2018    Lab Results  Component Value Date   CREATININE 0.80 06/16/2018    Urinalysis Results for orders placed or performed in visit on 07/14/18  Microscopic Examination  Result Value Ref Range   WBC, UA 0-5 0 - 5 /hpf   RBC, UA None seen 0 - 2 /hpf   Epithelial Cells (non renal) None seen 0 - 10 /hpf   Bacteria, UA None seen None seen/Few  Urinalysis, Complete  Result Value Ref Range   Specific Gravity, UA 1.010 1.005 - 1.030   pH, UA 6.0 5.0 - 7.5   Color, UA Yellow Yellow   Appearance Ur Clear  Clear   Leukocytes, UA Trace (A) Negative   Protein, UA Negative Negative/Trace   Glucose, UA Negative Negative   Ketones, UA Negative Negative   RBC, UA Negative Negative   Bilirubin, UA Negative Negative   Urobilinogen, Ur 0.2 0.2 - 1.0 mg/dL   Nitrite, UA Negative Negative   Microscopic Examination See below:     Assessment & Plan:    1. Malignant neoplasm of lateral wall of urinary bladder [facility]) Surgical pathology discussed, pTa TCC primarily low-grade with small component of high-grade  Given the size of the tumor in the component of high-grade,  generally would offer induction BCG, however, due to national back order, we currently have no medication available.  Alternatively, intravesical chemotherapy was also discussed.  Risks and benefits are reviewed.  At this point time, he will continue surveillance and consider intravesical treatment if he has recurrence.  He understands risks and benefits of this and all questions were answered.  Plan for return surveillance cystoscopy in 2 months  - Urinalysis, Complete  2. BPH with obstruction/lower urinary tract symptoms Symptoms improving on Flomax, continue this medication  3. Incomplete bladder emptying As above, will monitor PVR    Return in about 2 months (around 09/13/2018) for cysto.  Hollice Espy, MD  [facility] [address], [place], [place] [address] [phone]

## 2018-07-16 ENCOUNTER — Ambulatory Visit (INDEPENDENT_AMBULATORY_CARE_PROVIDER_SITE_OTHER): Admitting: Internal Medicine

## 2018-07-16 ENCOUNTER — Encounter: Payer: Self-pay | Admitting: Internal Medicine

## 2018-07-16 VITALS — BP 148/90 | HR 69 | Ht 70.0 in | Wt 190.0 lb

## 2018-07-16 DIAGNOSIS — J4520 Mild intermittent asthma, uncomplicated: Secondary | ICD-10-CM | POA: Diagnosis not present

## 2018-07-16 MED ORDER — ALBUTEROL SULFATE HFA 108 (90 BASE) MCG/ACT IN AERS: 1.0000 | INHALATION_SPRAY | Freq: Four times a day (QID) | RESPIRATORY_TRACT | 3 refills | Status: DC | PRN

## 2018-07-16 NOTE — Progress Notes (Signed)
[place] [facility] Consultation      Date: 07/16/2018,   MRN# [id number] Nathan Anthony [facility] 06-18-1956    AdmissionWeight: 190 lb (86.2 kg)                 CurrentWeight: 190 lb (86.2 kg) Nathan Anthony is a 62 y.o. old male seen in consultation for ASTHMA at the request of Dr. Carolin Coy   Previous OV  PFT's show ratio 69% with Fev1 67% +BD response Scooping of exp limb,air trapping Findings c/w Moderate COPD   Patient has been having excessive daytime sleepiness -sleep study does NOT show evidence of OSA ONO does NOT reveal hypoxia -alpha one antitrypsin levels wnl   SOB and wheezing have improved His cat died 2 months ago-seems that his asthma and allergies are much better controlled Off of advair for 40 days and feels well  Had smoked pipe for 44 years    62 yo Dx in 1997 with ASTHMA, EIB had been on advair 100 bid for 15 years changed to 250 BID 1 year ago Dx with GERD 1996 Retired from [employer] in 2000 Had frequent ASTHMA attacks 10 years ago SLeep study negative of OSA and ONO negative for hypoxia I have reviewed studies with patient    CHIEF COMPLAINT:   Follow up ASTHMA   HISTORY OF PRESENT ILLNESS  Chronic allergic rhinitis is stable Takes daily claritian  Asthma well controlled Uses albuterol infrequently No signs of exacerbation No signs of infection  Very active [employer]   No acute resp issues  Recently diagnosed with bladder cancer     Current Medication:  Current Outpatient Medications:  .  Adalimumab (HUMIRA PEN) 40 MG/0.8ML PNKT, Inject 40 mg into the skin every 14 (fourteen) days., Disp: , Rfl:  .  albuterol (PROVENTIL HFA;VENTOLIN HFA) 108 (90 Base) MCG/ACT inhaler, Inhale 1 puff into the lungs every 6 (six) hours as needed for wheezing or shortness of breath., Disp: 3 Inhaler, Rfl: 3 .  alendronate (FOSAMAX) 70 MG tablet, Take 70 mg by mouth once a week., Disp: , Rfl:  .  Azelastine-Fluticasone (DYMISTA) 137-50  MCG/ACT SUSP, Place 1 spray into the nose 2 (two) times daily., Disp: 1 Bottle, Rfl: 0 .  calcium-vitamin D 250-100 MG-UNIT tablet, Take 1 tablet by mouth 2 (two) times daily., Disp: , Rfl:  .  fluocinonide cream (LIDEX) 0.05 %, Apply topically 2 (two) times daily., Disp: , Rfl:  .  hydrocortisone-pramoxine (PROCTOFOAM-HC) rectal foam, One application to rectal area twice a day as needed, Disp: 60 g, Rfl: 3 .  loratadine (CLARITIN) 10 MG tablet, Take 10 mg by mouth daily., Disp: , Rfl:  .  meloxicam (MOBIC) 15 MG tablet, Take 15 mg by mouth daily., Disp: , Rfl:  .  montelukast (SINGULAIR) 10 MG tablet, TAKE 1 TABLET DAILY, Disp: 90 tablet, Rfl: 4 .  Multiple Vitamin (MULTIVITAMIN) capsule, Take 1 capsule by mouth daily., Disp: , Rfl:  .  omeprazole (PRILOSEC) 20 MG capsule, TAKE 1 CAPSULE DAILY, Disp: 90 capsule, Rfl: 4 .  rosuvastatin (CRESTOR) 10 MG tablet, TAKE 1 TABLET DAILY, Disp: 90 tablet, Rfl: 1 .  tamsulosin (FLOMAX) 0.4 MG CAPS capsule, Take 1 capsule (0.4 mg total) by mouth daily., Disp: 90 capsule, Rfl: 11    ALLERGIES   Pravastatin and Simvastatin     REVIEW OF SYSTEMS   Review of Systems  Constitutional: Negative for chills, diaphoresis, fever, malaise/fatigue and weight loss.  HENT: Negative for congestion and hearing loss.  Eyes: Negative for blurred vision and double vision.  Respiratory: Negative for cough, shortness of breath and wheezing.   Cardiovascular: Negative for chest pain, palpitations and orthopnea.  Gastrointestinal: Negative for heartburn.  Skin: Negative for rash.  Neurological: Negative for weakness.  All other systems reviewed and are negative.  BP (!) 148/90 (BP Location: Left Arm, Cuff Size: Normal)   Pulse 69   Ht 5\' 10"  (1.778 m)   Wt 190 lb (86.2 kg)   SpO2 97%   BMI 27.26 kg/m    PHYSICAL EXAM  Physical Exam  Constitutional: He is oriented to person, place, and time. He appears well-developed and well-nourished. No distress.    Eyes: Pupils are equal, round, and reactive to light.  Cardiovascular: Normal rate, regular rhythm and normal heart sounds.  No murmur heard. Pulmonary/Chest: Breath sounds normal. No stridor. No respiratory distress. He has no wheezes.  Musculoskeletal: Normal range of motion. He exhibits no edema.  Neurological: He is alert and oriented to person, place, and time. No cranial nerve deficit.  Skin: Skin is warm. He is not diaphoretic.  Psychiatric: He has a normal mood and affect.       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 All questions answered  Follow up in 1 year  Elnita Surprenant Patricia Pesa, M.D.  [facility]  Medical Director [place] Director [facility] Cardio-Pulmonary Department

## 2018-07-16 NOTE — Patient Instructions (Signed)
Albuterol as needed  Continue nasal spray as prescribed  Avoid triggers

## 2018-08-20 IMAGING — MR MR BRAIN/IAC WO/W
12 series · 37 of 48 positions shown · IV contrast (multihance)
Comparison: None.

CLINICAL DATA: Left-sided tinnitus.

EXAM:
MRI HEAD WITHOUT AND WITH CONTRAST
TECHNIQUE: Multiplanar, multiecho pulse sequences of the brain and surrounding
structures were obtained without and with intravenous contrast.
CONTRAST:  15mL MULTIHANCE GADOBENATE DIMEGLUMINE 529 MG/ML IV SOLN

[Series 2: T1 · sagittal · 5.0mm · 0.45mm/px · 2 of 29 slices shown (1 of 3)]
[im 1/29]
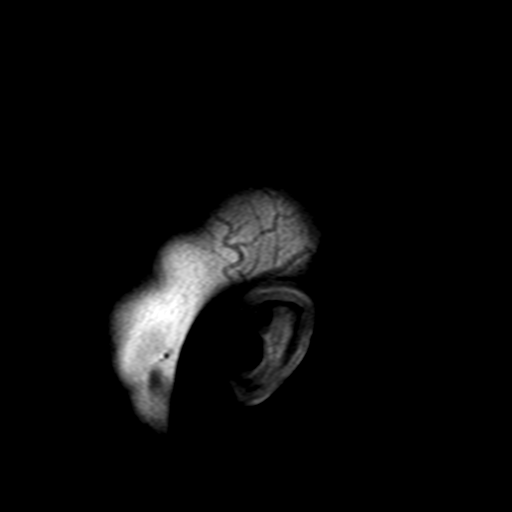
[im 29/29]
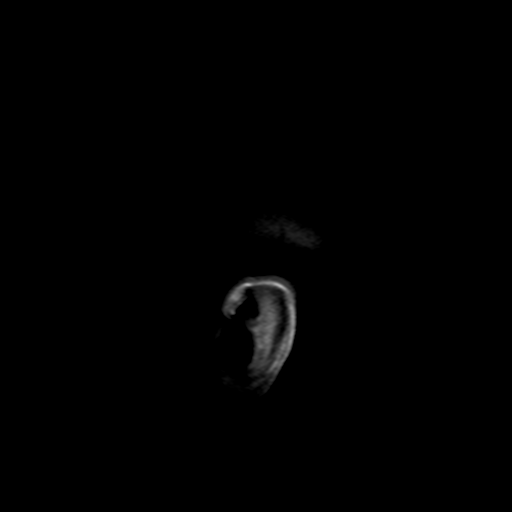

[Series 4: DWI · axial · 4.0mm · 0.94mm/px · z∈[-23,+121]mm · 4 of 43 slices shown]
[im 1/43]
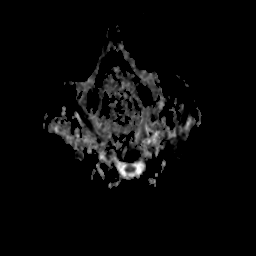
[im 15/43]
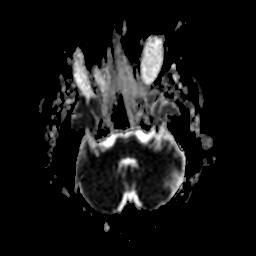
[im 29/43]
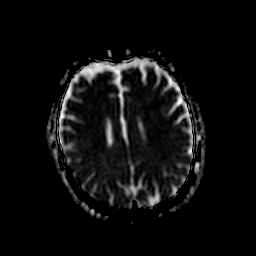
[im 43/43]
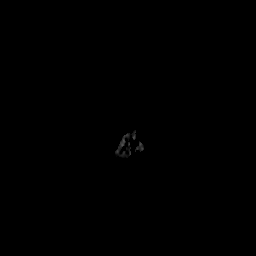

[Series 5: T2 · axial · 5.0mm · 0.45mm/px · z∈[-34,+110]mm · 2 of 25 slices shown (1 of 2)]
[im 1/25]
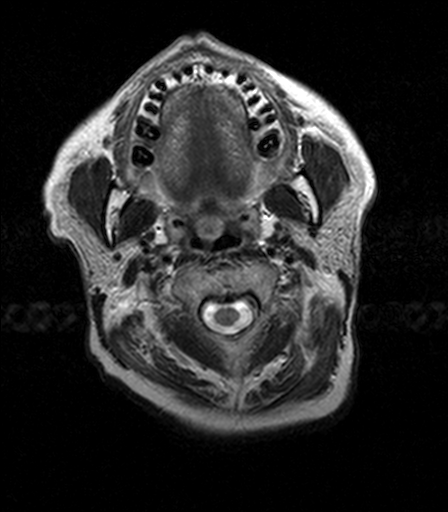
[im 25/25]
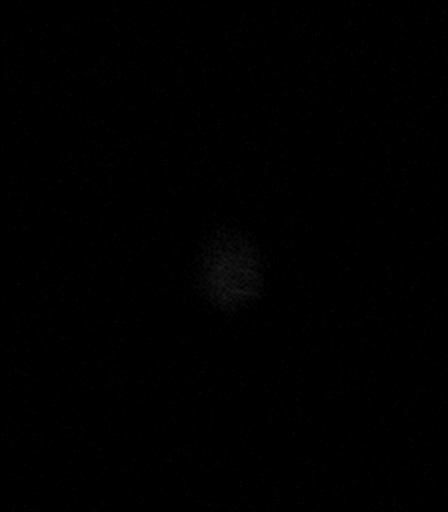

[Series 6: FLAIR · axial · 3.0mm · 0.90mm/px · z∈[-25,+101]mm · 5 of 50 slices shown]
[im 1/50]
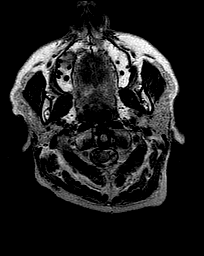
[im 13/50]
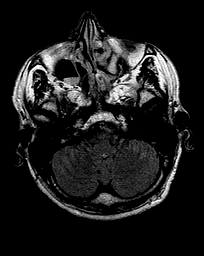
[im 25/50]
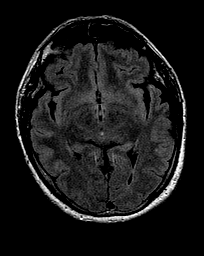
[im 37/50]
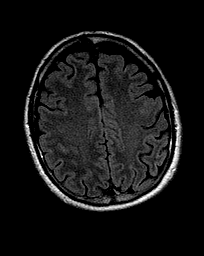
[im 50/50]
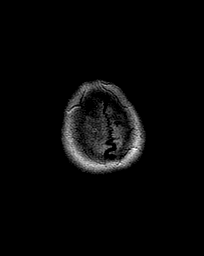

[Series 7: T2 · axial · 5.0mm · 0.45mm/px · z∈[-29,+105]mm · 3 of 27 slices shown (2 of 2)]
[im 1/27]
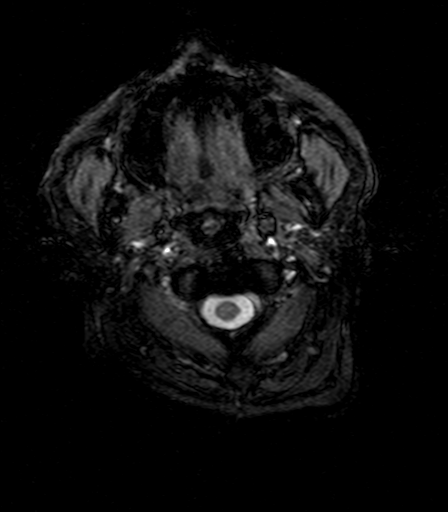
[im 14/27]
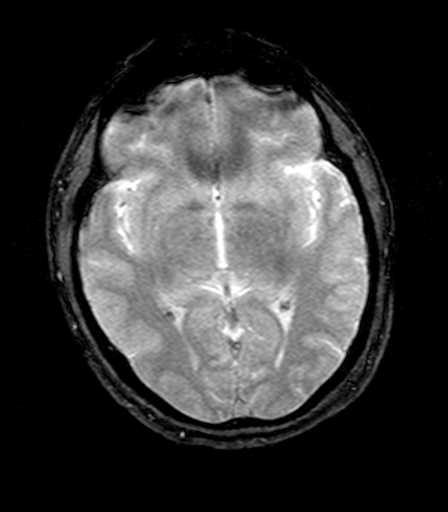
[im 27/27]
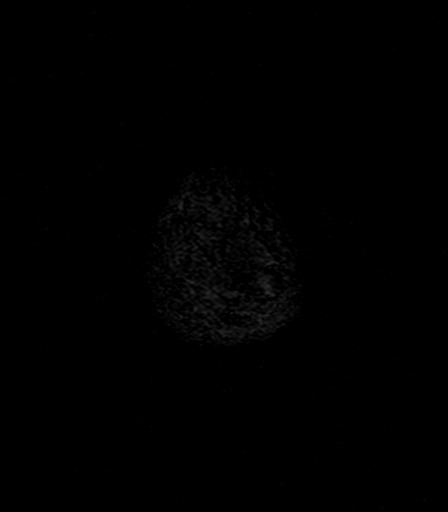

[Series 8: T1 · coronal · 3.0mm · 0.70mm/px · 2 of 19 slices shown (2 of 3)]
[im 1/19]
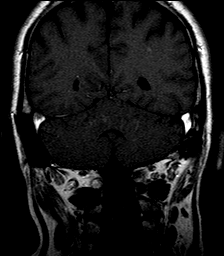
[im 19/19]
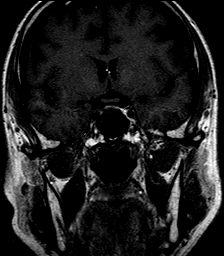

[Series 9: T1 · axial · 3.0mm · 0.70mm/px · z∈[-24,+22]mm · 2 of 19 slices shown (3 of 3)]
[im 1/19]
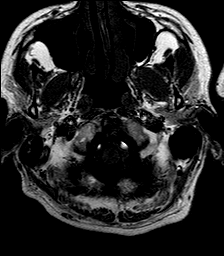
[im 19/19]
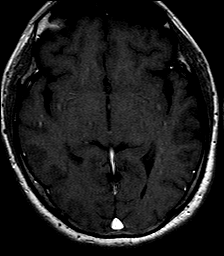

[Series 10: ax (id) · axial · 4.0mm · 0.94mm/px · z∈[-23,+121]mm · 4 of 42 slices shown]
[im 1/42]
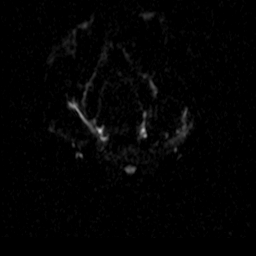
[im 14/42]
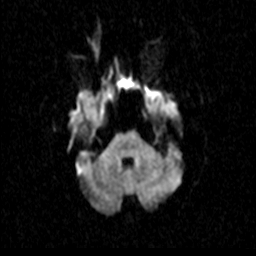
[im 28/42]
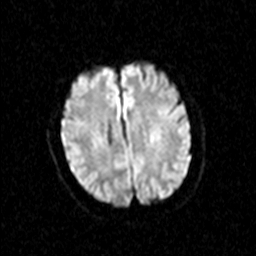
[im 42/42]
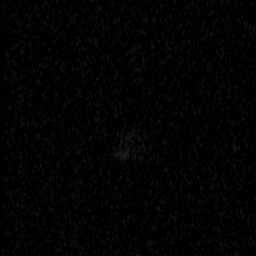

[Series 11: bSSFP · axial · 1.0mm · 0.35mm/px · 1 of 56 slices shown]
[im 1/56]
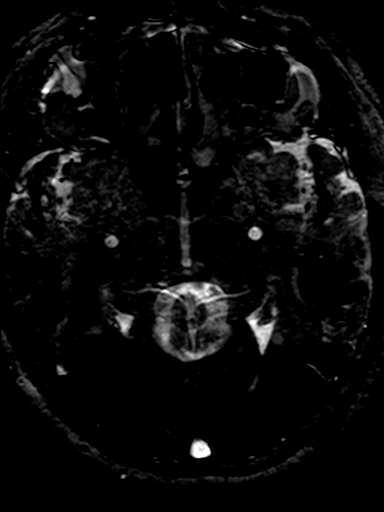

[Series 12: T1 post-contrast · coronal · 3.0mm · 0.70mm/px · 2 of 19 slices shown (1 of 3)]
[im 1/19]
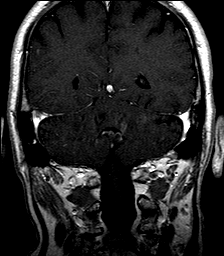
[im 19/19]
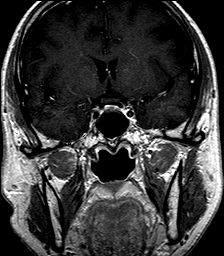

[Series 13: T1 post-contrast · axial · 3.0mm · 0.70mm/px · z∈[-24,+22]mm · 2 of 19 slices shown (2 of 3)]
[im 1/19]
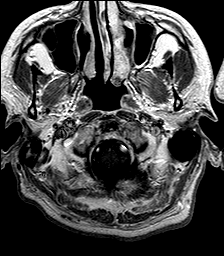
[im 19/19]
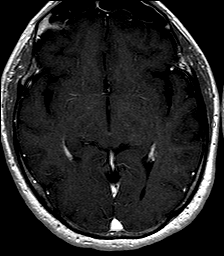

[Series 14: T1 post-contrast · axial · 1.0mm · 0.45mm/px · z∈[-29,+108]mm · 8 of 160 slices shown (3 of 3)]
[im 1/160]
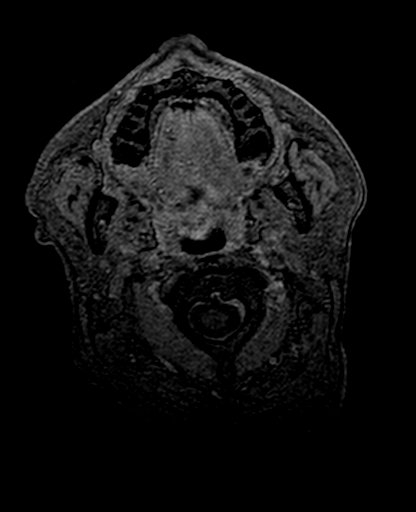
[im 23/160]
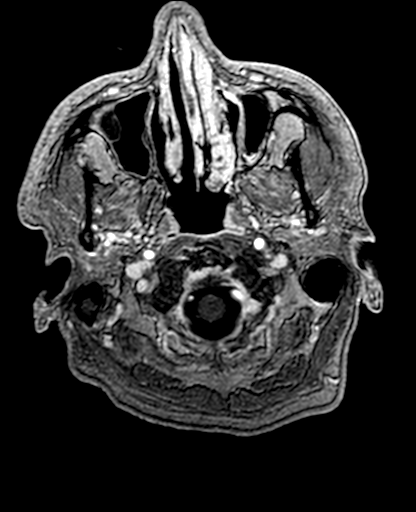
[im 46/160]
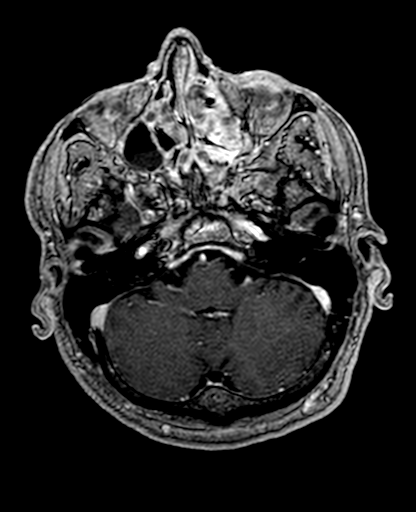
[im 69/160]
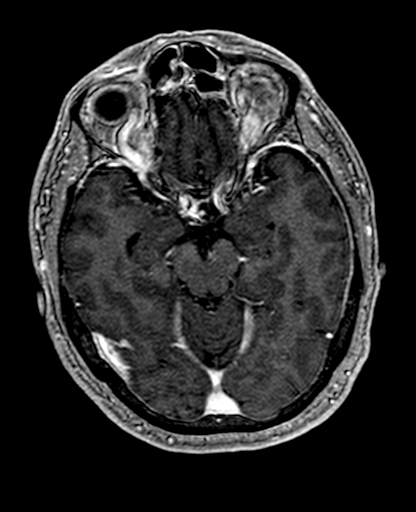
[im 91/160]
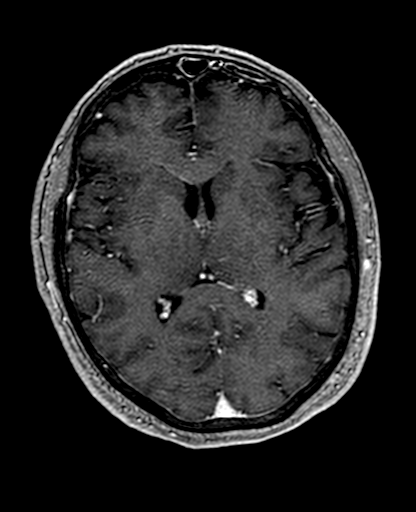
[im 114/160]
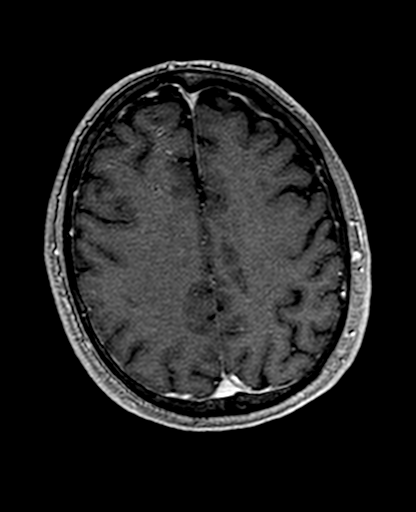
[im 137/160]
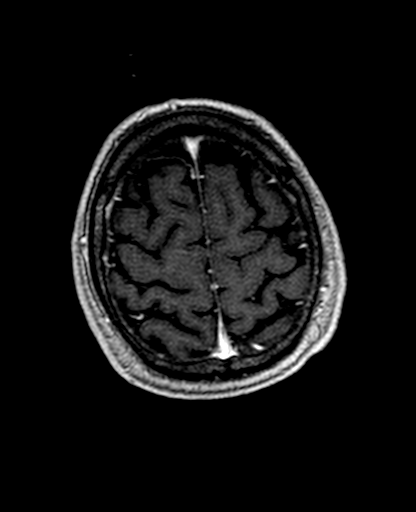
[im 160/160]
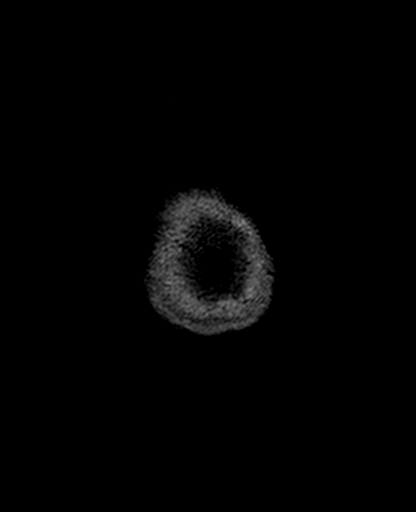

[37 of 48 positions shown; findings below may reference images not displayed]

FINDINGS: Brain: No acute infarct, hemorrhage, or mass lesion is present. The
ventricles are of normal size. No significant extraaxial fluid
collection is present.

No significant white matter disease is present. The brainstem and
cerebellum are normal.

Vascular: Flow is present in the major intracranial arteries.

Skull and upper cervical spine: The skullbase is within normal
limits. The craniocervical junction is normal. Midline sagittal
structures are unremarkable.

Sinuses/Orbits: Bilateral maxillary sinus disease is noted.
Left-sided disease appears chronic. There is diffuse opacification
of ethmoid air cells bilaterally, left greater than right. Mild
mucosal thickening is noted in the frontal sinuses and bilateral
sphenoid sinuses. The mastoid air cells are clear.

Other: Dedicated imaging of the internal auditory canals
demonstrates no focal enhancement or mass lesion. The seventh and
eighth cranial nerves are discretely visualized and within normal
limits. The inner ear structures are visualized and normal.

Postcontrast images through the remainder of the brain are
unremarkable.
IMPRESSION: 1. Normal appearance of the brain and internal auditory canals
without acute or focal lesion to explain tinnitus.
2. Moderate chronic bilateral ethmoid and maxillary sinus disease,
left greater than right.

## 2018-08-30 ENCOUNTER — Other Ambulatory Visit: Payer: Self-pay | Admitting: Internal Medicine

## 2018-09-14 ENCOUNTER — Encounter: Payer: Self-pay | Admitting: Urology

## 2018-09-14 ENCOUNTER — Ambulatory Visit (INDEPENDENT_AMBULATORY_CARE_PROVIDER_SITE_OTHER): Admitting: Urology

## 2018-09-14 VITALS — BP 143/82 | HR 65 | Ht 70.0 in | Wt 192.0 lb

## 2018-09-14 DIAGNOSIS — N401 Enlarged prostate with lower urinary tract symptoms: Secondary | ICD-10-CM

## 2018-09-14 DIAGNOSIS — N138 Other obstructive and reflux uropathy: Secondary | ICD-10-CM

## 2018-09-14 DIAGNOSIS — C672 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: Secondary | ICD-10-CM

## 2018-09-14 LAB — URINALYSIS, COMPLETE
BILIRUBIN UA: NEGATIVE
GLUCOSE, UA: NEGATIVE
KETONES UA: NEGATIVE
Leukocytes, UA: NEGATIVE
Nitrite, UA: NEGATIVE
PROTEIN UA: NEGATIVE
RBC UA: NEGATIVE
Urobilinogen, Ur: 0.2 mg/dL (ref 0.2–1.0)
pH, UA: 6 (ref 5.0–7.5)

## 2018-09-14 NOTE — Progress Notes (Signed)
   09/14/2018   CC:  Chief Complaint  Patient presents with  . Cysto    BLADDER CANCER    HPI: 63 year old male with newly diagnosed bladder cancer and a personal history of obstructive urinary symptoms who returns today for a 3 month surveillance cytoscopy  He was initially seen and evaluated for an abnormal rectal exam but also noted episodes of painless gross hematuria as well as microscopic hematuria.  He underwent CT urogram which was suspicious for bladder tumor. TURBT (06/2018) revealed instillation of intravesical gemcitabine found to have a 2.5 cm spherical papillary tumor involving the left posterior base of the bladder.  Surgical pathology consistent with pTa disease, primarily low-grade with 5% high-grade involvement.  Muscle was present and not involved.  He is on Flomax (started preoperatively) and has noticed improved AM urinary symptoms (better flow, less frequency, better emptying).   Patient reports occasional burning dysuria today.  Blood pressure (!) 143/82, pulse 65, height 5\' 10"  (1.778 m), weight 192 lb (87.1 kg). NED. A&Ox3.   No respiratory distress   Abd soft, NT, ND Normal phallus with bilateral descended testicles  Cystoscopy Procedure Note  Patient identification was confirmed, informed consent was obtained, and patient was prepped using Betadine solution.  Lidocaine jelly was administered per urethral meatus.     Pre-Procedure: - Inspection reveals a normal caliber ureteral meatus.  Procedure: The flexible cystoscope was introduced without difficulty - No urethral strictures/lesions are present. - Stellate scar with mild adherent debris easily dislodged with manipulation most consistent with necrotic material with distrophic calciication. No obvious papillary tumor. - Enlarged prostate - Normal bladder neck - Bilateral ureteral orifices identified - Bladder mucosa  reveals no ulcers, tumors, or lesions - No bladder stones - No  trabeculation  Retroflexion shows well circumscribed median lobe, small and spherical. Mild trabeculation. No other lesions.    Post-Procedure: - Patient tolerated the procedure well  Assessment/ Plan:  1. Malignant neoplasm of lateral wall of urinary bladder (HCC) Discussed possible implementation of BCG treatments with recurrence Cysto in 3 months to check scar progress. If it has yet to heal, may recommend re-biopsy  Return in about 3 months (around 12/14/2018) for cysto.   I, Temidayo Atanda-Ogunleye , am acting as a scribe for Hollice Espy, MD  I have reviewed the above documentation for accuracy and completeness, and I agree with the above.   Hollice Espy, MD

## 2018-12-14 ENCOUNTER — Other Ambulatory Visit: Admitting: Urology

## 2019-01-27 ENCOUNTER — Other Ambulatory Visit: Admitting: Urology

## 2019-02-08 ENCOUNTER — Ambulatory Visit (INDEPENDENT_AMBULATORY_CARE_PROVIDER_SITE_OTHER): Admitting: Urology

## 2019-02-08 ENCOUNTER — Encounter: Payer: Self-pay | Admitting: Urology

## 2019-02-08 ENCOUNTER — Other Ambulatory Visit: Payer: Self-pay

## 2019-02-08 VITALS — BP 152/76 | HR 66 | Ht 70.0 in | Wt 196.0 lb

## 2019-02-08 DIAGNOSIS — C672 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: Secondary | ICD-10-CM

## 2019-02-08 LAB — URINALYSIS, COMPLETE
Bilirubin, UA: NEGATIVE
Glucose, UA: NEGATIVE
Ketones, UA: NEGATIVE
Nitrite, UA: NEGATIVE
Protein,UA: NEGATIVE
RBC, UA: NEGATIVE
Specific Gravity, UA: 1.015 (ref 1.005–1.030)
Urobilinogen, Ur: 0.2 mg/dL (ref 0.2–1.0)
pH, UA: 6.5 (ref 5.0–7.5)

## 2019-02-08 LAB — MICROSCOPIC EXAMINATION
Bacteria, UA: NONE SEEN
RBC: NONE SEEN /hpf (ref 0–2)

## 2019-02-08 NOTE — Progress Notes (Signed)
   02/08/2019   CC:  Chief Complaint  Patient presents with  . Cysto    HPI: 63 year old male with history of bladder cancer and a personal history of obstructive urinary symptoms who returns today for a 3 month surveillance cytoscopy.  He was initially seen and evaluated for an abnormal rectal exam but also noted episodes of painless gross hematuria as well as microscopic hematuria.  He underwent CT urogram which was suspicious for bladder tumor. TURBT (06/2018) revealed instillation of intravesical gemcitabine found to have a 2.5 cm spherical papillary tumor involving the left posterior base of the bladder.  Surgical pathology consistent with pTa disease, primarily low-grade with 5% high-grade involvement.  Muscle was present and not involved.  He is on Flomax (started preoperatively), doing well from a urinary standpoint.   Blood pressure (!) 143/82, pulse 65, height 5\' 10"  (1.778 m), weight 192 lb (87.1 kg). NED. A&Ox3.   No respiratory distress   Abd soft, NT, ND Normal phallus with bilateral descended testicles  Cystoscopy Procedure Note  Patient identification was confirmed, informed consent was obtained, and patient was prepped using Betadine solution.  Lidocaine jelly was administered per urethral meatus.     Pre-Procedure: - Inspection reveals a normal caliber ureteral meatus.  Procedure: The flexible cystoscope was introduced without difficulty - No urethral strictures/lesions are present. - Stellate scar on posterior bladder wall - Enlarged prostate - Normal bladder neck - Bilateral ureteral orifices identified - Bladder mucosa  reveals no ulcers, tumors, or lesions - No bladder stones - No trabeculation  Retroflexion shows well circumscribed median lobe, small and spherical. Mild trabeculation. No other lesions.    Post-Procedure: - Patient tolerated the procedure well  Assessment/ Plan:  1. Malignant neoplasm of lateral wall of urinary bladder (HCC)  Discussed possible implementation of BCG treatments with recurrence NED today Repeat cystoscopy in 3 months  Return in about 3 months (around 05/11/2019) for cysto.   Hollice Espy, MD

## 2019-04-27 ENCOUNTER — Ambulatory Visit (INDEPENDENT_AMBULATORY_CARE_PROVIDER_SITE_OTHER): Admitting: Internal Medicine

## 2019-04-27 ENCOUNTER — Encounter: Payer: Self-pay | Admitting: Internal Medicine

## 2019-04-27 ENCOUNTER — Other Ambulatory Visit: Payer: Self-pay

## 2019-04-27 VITALS — BP 128/74 | HR 65 | Ht 70.0 in | Wt 198.0 lb

## 2019-04-27 DIAGNOSIS — J4530 Mild persistent asthma, uncomplicated: Secondary | ICD-10-CM

## 2019-04-27 DIAGNOSIS — Z0000 Encounter for general adult medical examination without abnormal findings: Secondary | ICD-10-CM | POA: Diagnosis not present

## 2019-04-27 DIAGNOSIS — M05712 Rheumatoid arthritis with rheumatoid factor of left shoulder without organ or systems involvement: Secondary | ICD-10-CM

## 2019-04-27 DIAGNOSIS — Z125 Encounter for screening for malignant neoplasm of prostate: Secondary | ICD-10-CM

## 2019-04-27 DIAGNOSIS — E782 Mixed hyperlipidemia: Secondary | ICD-10-CM | POA: Diagnosis not present

## 2019-04-27 DIAGNOSIS — M05711 Rheumatoid arthritis with rheumatoid factor of right shoulder without organ or systems involvement: Secondary | ICD-10-CM

## 2019-04-27 DIAGNOSIS — K219 Gastro-esophageal reflux disease without esophagitis: Secondary | ICD-10-CM

## 2019-04-27 DIAGNOSIS — C672 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: Secondary | ICD-10-CM | POA: Diagnosis not present

## 2019-04-27 MED ORDER — FLUOCINONIDE 0.05 % EX CREA: TOPICAL_CREAM | Freq: Two times a day (BID) | CUTANEOUS | 0 refills | Status: DC

## 2019-04-27 NOTE — Progress Notes (Signed)
Date:  04/27/2019   Name:  Nathan Anthony   DOB:  [birth year]   MRN:  [id number]   Chief Complaint: Annual Exam Nathan Anthony is a 63 y.o. male who presents today for his Complete Annual Exam. He feels fairly well. He reports exercising occasionally. He reports he is sleeping fairly well.   Colonoscopy 2011 PPV-23 2018  Hyperlipidemia This is a chronic problem. The problem is controlled. Pertinent negatives include no chest pain, myalgias or shortness of breath. Current antihyperlipidemic treatment includes statins.  Gastroesophageal Reflux He reports no abdominal pain, no chest pain, no choking, no heartburn or no wheezing. The problem occurs rarely. Pertinent negatives include no fatigue. He has tried a PPI for the symptoms. The treatment provided significant relief.  Asthma He complains of chest tightness. There is no shortness of breath or wheezing. This is a recurrent problem. The problem has been gradually improving. Pertinent negatives include no appetite change, chest pain, headaches, heartburn, myalgias or trouble swallowing. His symptoms are alleviated by beta-agonist. He reports moderate improvement on treatment. His past medical history is significant for asthma.  RA - on meloxicam and Humira injections; followed by Rheumatology.  He does not think that Mobic does much at this point. Bladder cancer - he has been treated with surgery and intravesical chemotherapy.  He feels well, denies bladder pain or hematuria.  He has a follow up cyst in several months.  Lab Results  Component Value Date   CREATININE 0.80 06/16/2018   BUN 15 04/22/2018   NA 142 04/22/2018   K 4.7 04/22/2018   CL 102 04/22/2018   CO2 24 04/22/2018   Lab Results  Component Value Date   CHOL 188 04/22/2018   HDL 31 (L) 04/22/2018   LDLCALC 79 04/22/2018   TRIG 392 (H) 04/22/2018   CHOLHDL 6.1 (H) 04/22/2018     Review of Systems  Constitutional: Negative for appetite change, chills,  diaphoresis, fatigue and unexpected weight change.  HENT: Negative for hearing loss, tinnitus, trouble swallowing and voice change.   Eyes: Negative for visual disturbance.  Respiratory: Negative for choking, shortness of breath and wheezing.   Cardiovascular: Negative for chest pain, palpitations and leg swelling.  Gastrointestinal: Positive for anal bleeding (hemorrhoids - uses creams as needed). Negative for abdominal pain, blood in stool, constipation, diarrhea and heartburn.  Genitourinary: Negative for difficulty urinating, dysuria and frequency.  Musculoskeletal: Negative for arthralgias, back pain and myalgias.  Skin: Negative for color change and rash.  Neurological: Negative for dizziness, syncope and headaches.  Hematological: Negative for adenopathy.  Psychiatric/Behavioral: Negative for dysphoric mood and sleep disturbance.    Patient Active Problem List   Diagnosis Date Noted  . Enlarged prostate on rectal examination 04/22/2018  . DISH (diffuse idiopathic skeletal hyperostosis) 11/14/2016  . 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 Unilateral inguinal hernia without obstruction or gangrene 05/22/2015  . GERD (gastroesophageal reflux disease) 11/15/2014  . Hyperlipidemia 11/15/2014  . Mild persistent asthma without complication 123456  . Osteoporosis without current pathological fracture 11/15/2014  . 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 Pravastatin     Other reaction(s): Nausea Only  . Simvastatin     Other reaction(s): Nausea Only    Past Surgical History:  Procedure Laterality Date  . BASAL CELL CARCINOMA EXCISION    . COLONOSCOPY  06/06/2010  . HERNIA REPAIR    . TRANSURETHRAL RESECTION OF  BLADDER TUMOR WITH MITOMYCIN-C N/A 06/21/2018   Procedure: TRANSURETHRAL RESECTION OF BLADDER TUMOR WITH Gemcitabine;  Surgeon: Hollice Espy, MD;  Location: [facility]  ORS;  Service: Urology;  Laterality: N/A;  . VARICOCELECTOMY    . VARICOCELECTOMY      Social History   Tobacco Use  . Smoking status: Former Smoker    Years: 5.00    Types: Pipe  . Smokeless tobacco: Never Used  Substance Use Topics  . Alcohol use: No  . Drug use: No     Medication list has been reviewed and updated.  Current Meds  Medication Sig  . Adalimumab (HUMIRA PEN) 40 MG/0.8ML PNKT Inject 40 mg into the skin every 14 (fourteen) days.  Marland Kitchen albuterol (PROVENTIL HFA;VENTOLIN HFA) 108 (90 Base) MCG/ACT inhaler Inhale 1 puff into the lungs every 6 (six) hours as needed for wheezing or shortness of breath.  Marland Kitchen alendronate (FOSAMAX) 70 MG tablet Take 70 mg by mouth once a week.  . Azelastine-Fluticasone (DYMISTA) 137-50 MCG/ACT SUSP Place 1 spray into the nose 2 (two) times daily.  . calcium-vitamin D 250-100 MG-UNIT tablet Take 1 tablet by mouth 2 (two) times daily.  . fluocinonide cream (LIDEX) 0.05 % Apply topically 2 (two) times daily.  Marland Kitchen loratadine (CLARITIN) 10 MG tablet Take 10 mg by mouth daily.  . meloxicam (MOBIC) 15 MG tablet Take 15 mg by mouth daily.  . montelukast (SINGULAIR) 10 MG tablet TAKE 1 TABLET DAILY  . Multiple Vitamin (MULTIVITAMIN) capsule Take 1 capsule by mouth daily.  Marland Kitchen omeprazole (PRILOSEC) 20 MG capsule TAKE 1 CAPSULE DAILY  . rosuvastatin (CRESTOR) 10 MG tablet TAKE 1 TABLET DAILY  . tamsulosin (FLOMAX) 0.4 MG CAPS capsule Take 1 capsule (0.4 mg total) by mouth daily.    PHQ 2/9 Scores 04/27/2019 04/22/2018 04/22/2017  PHQ - 2 Score 0 0 0    BP Readings from Last 3 Encounters:  04/27/19 128/74  02/08/19 (!) 152/76  09/14/18 (!) 143/82    Physical Exam Vitals signs and nursing note reviewed.  Constitutional:      Appearance: Normal appearance. He is well-developed.  HENT:     Head: Normocephalic.     Right Ear: Tympanic membrane, ear canal and external ear normal.     Left Ear: Tympanic membrane, ear canal and external ear normal.      Nose: Nose normal.     Mouth/Throat:     Pharynx: Uvula midline.  Eyes:     Conjunctiva/sclera: Conjunctivae normal.     Pupils: Pupils are equal, round, and reactive to light.  Neck:     Musculoskeletal: Normal range of motion and neck supple.     Thyroid: No thyromegaly.     Vascular: No carotid bruit.  Cardiovascular:     Rate and Rhythm: Normal rate and regular rhythm.     Heart sounds: Normal heart sounds.  Pulmonary:     Effort: Pulmonary effort is normal.     Breath sounds: Normal breath sounds. No wheezing.  Chest:     Breasts:        Right: No mass.        Left: No mass.  Abdominal:     General: Bowel sounds are normal.     Palpations: Abdomen is soft.     Tenderness: There is no abdominal tenderness.  Musculoskeletal:        General: No deformity.     Right lower leg: No edema.     Left lower leg: No edema.  Lymphadenopathy:     Cervical: No cervical adenopathy.  Skin:    General: Skin is warm and dry.     Capillary Refill: Capillary refill takes less than 2 seconds.     Findings: No lesion or rash.  Neurological:     General: No focal deficit present.     Mental Status: He is alert and oriented to person, place, and time.     Deep Tendon Reflexes: Reflexes are normal and symmetric.  Psychiatric:        Speech: Speech normal.        Behavior: Behavior normal.        Thought Content: Thought content normal.        Judgment: Judgment normal.     Wt Readings from Last 3 Encounters:  04/27/19 198 lb (89.8 kg)  02/08/19 196 lb (88.9 kg)  09/14/18 192 lb (87.1 kg)    BP 128/74   Pulse 65   Ht 5\' 10"  (1.778 m)   Wt 198 lb (89.8 kg)   SpO2 97%   BMI 28.41 kg/m   Assessment and Plan: 1. Annual physical exam Normal exam He plans to start exercise and weight loss program  2. Mixed hyperlipidemia Tolerating statin medication without side effects at this time LDL is at goal of < 10 on current dose Continue same therapy without change at this time. -  Lipid panel  3. Gastroesophageal reflux disease, esophagitis presence not specified Symptoms well controlled on daily PPI No red flag signs such as weight loss, n/v, melena Will continue daily medications. - CBC with Differential/Platelet  4. Mild persistent asthma without complication Followed by Pulmonary Exam normal today Pt is only using singulair and albuterol - he had more sx on Advair Return in the near future for flu shot  5. 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 Prostate cancer screening DRE deferred - PSA   Partially dictated using Dragon software. Any errors are unintentional.  Halina Maidens, MD [facility]  04/27/2019

## 2019-04-28 LAB — CBC WITH DIFFERENTIAL/PLATELET
Basophils Absolute: 0.1 10*3/uL (ref 0.0–0.2)
Basos: 1 %
EOS (ABSOLUTE): 0.6 10*3/uL — ABNORMAL HIGH (ref 0.0–0.4)
Eos: 6 %
Hematocrit: 42 % (ref 37.5–51.0)
Hemoglobin: 13.4 g/dL (ref 13.0–17.7)
Immature Grans (Abs): 0 10*3/uL (ref 0.0–0.1)
Immature Granulocytes: 0 %
Lymphocytes Absolute: 2.6 10*3/uL (ref 0.7–3.1)
Lymphs: 29 %
MCH: 27.6 pg (ref 26.6–33.0)
MCHC: 31.9 g/dL (ref 31.5–35.7)
MCV: 87 fL (ref 79–97)
Monocytes Absolute: 0.7 10*3/uL (ref 0.1–0.9)
Monocytes: 8 %
Neutrophils Absolute: 5.1 10*3/uL (ref 1.4–7.0)
Neutrophils: 56 %
Platelets: 356 10*3/uL (ref 150–450)
RBC: 4.85 x10E6/uL (ref 4.14–5.80)
RDW: 13.7 % (ref 11.6–15.4)
WBC: 9 10*3/uL (ref 3.4–10.8)

## 2019-04-28 LAB — COMPREHENSIVE METABOLIC PANEL
ALT: 16 IU/L (ref 0–44)
AST: 20 IU/L (ref 0–40)
Albumin/Globulin Ratio: 1.3 (ref 1.2–2.2)
Albumin: 4 g/dL (ref 3.8–4.8)
Alkaline Phosphatase: 70 IU/L (ref 39–117)
BUN/Creatinine Ratio: 20 (ref 10–24)
BUN: 17 mg/dL (ref 8–27)
Bilirubin Total: 0.4 mg/dL (ref 0.0–1.2)
CO2: 22 mmol/L (ref 20–29)
Calcium: 9 mg/dL (ref 8.6–10.2)
Chloride: 102 mmol/L (ref 96–106)
Creatinine, Ser: 0.86 mg/dL (ref 0.76–1.27)
GFR calc Af Amer: 107 mL/min/{1.73_m2} (ref >59)
GFR calc non Af Amer: 93 mL/min/{1.73_m2} (ref >59)
Globulin, Total: 3 g/dL (ref 1.5–4.5)
Glucose: 88 mg/dL (ref 65–99)
Potassium: 4.9 mmol/L (ref 3.5–5.2)
Sodium: 140 mmol/L (ref 134–144)
Total Protein: 7 g/dL (ref 6.0–8.5)

## 2019-04-28 LAB — LIPID PANEL
Chol/HDL Ratio: 4.5 ratio (ref 0.0–5.0)
Cholesterol, Total: 157 mg/dL (ref 100–199)
HDL: 35 mg/dL — ABNORMAL LOW (ref >39)
LDL Calculated: 87 mg/dL (ref 0–99)
Triglycerides: 174 mg/dL — ABNORMAL HIGH (ref 0–149)
VLDL Cholesterol Cal: 35 mg/dL (ref 5–40)

## 2019-04-28 LAB — PSA: Prostate Specific Ag, Serum: 0.6 ng/mL (ref 0.0–4.0)

## 2019-05-11 ENCOUNTER — Other Ambulatory Visit: Payer: Self-pay

## 2019-05-11 ENCOUNTER — Encounter: Payer: Self-pay | Admitting: Urology

## 2019-05-11 ENCOUNTER — Ambulatory Visit (INDEPENDENT_AMBULATORY_CARE_PROVIDER_SITE_OTHER): Admitting: Urology

## 2019-05-11 VITALS — Ht 70.0 in

## 2019-05-11 DIAGNOSIS — C672 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: Secondary | ICD-10-CM

## 2019-05-11 NOTE — Progress Notes (Signed)
   05/11/2019   CC:  Chief Complaint  Patient presents with  . Cysto    HPI: 63 year old male with history of bladder cancer and a personal history of obstructive urinary symptoms who returns today for a 3 month surveillance cytoscopy.  He was initially seen and evaluated for an abnormal rectal exam but also noted episodes of painless gross hematuria as well as microscopic hematuria.  He underwent CT urogram which was suspicious for bladder tumor. 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  Surgical pathology consistent with pTa disease, primarily low-grade with 5% high-grade involvement.  Muscle was present and not involved.  He is on Flomax (started preoperatively), doing well from a urinary standpoint.   Blood pressure (!) 143/82, pulse 65, height 5\' 10"  (1.778 m), weight 192 lb (87.1 kg). NED. A&Ox3.   No respiratory distress   Abd soft, NT, ND Normal phallus with bilateral descended testicles  Cystoscopy Procedure Note  Patient identification was confirmed, informed consent was obtained, and patient was prepped using Betadine solution.  Lidocaine jelly was administered per urethral meatus.     Pre-Procedure: - Inspection reveals a normal caliber ureteral meatus.  Procedure: The flexible cystoscope was introduced without difficulty - No urethral strictures/lesions are present. - Stellate scar on posterior left bladder wall with TINY adjacent tumor between the resection bed in the left UO measuring approximately 2 mm, round and papillary nature - Enlarged prostate - Normal bladder neck - Bilateral ureteral orifices identified - Bladder mucosa  reveals no ulcers, tumors, or lesions - No bladder stones - No trabeculation  Retroflexion shows well circumscribed median lobe, small and spherical. Mild trabeculation. No other lesions.    Post-Procedure: -  Patient tolerated the procedure well  Assessment/ Plan:  1. Malignant neoplasm of lateral wall of urinary bladder (HCC) Tiny recurrence today which is likely a small satellite lesion from his primary much larger tumor Recurrence measures proximally 2 mm has a very low-grade appearance Given that his previous tumors primarily low-grade and noninvasive, he was offered return to the operating room for formal resection with intravesical gemcitabine versus an office fulguration Risk and benefits of each were discussed He is most interested in an office fulguration, discussed the procedure and all questions answered   Return for office bladder tumor fulgeration at next availible (have him come 30 min prior to procedure).   Hollice Espy, MD

## 2019-05-12 LAB — URINALYSIS, COMPLETE
Bilirubin, UA: NEGATIVE
Glucose, UA: NEGATIVE
Ketones, UA: NEGATIVE
Nitrite, UA: NEGATIVE
Protein,UA: NEGATIVE
RBC, UA: NEGATIVE
Specific Gravity, UA: 1.01 (ref 1.005–1.030)
Urobilinogen, Ur: 0.2 mg/dL (ref 0.2–1.0)
pH, UA: 7 (ref 5.0–7.5)

## 2019-05-12 LAB — MICROSCOPIC EXAMINATION
Bacteria, UA: NONE SEEN
RBC: NONE SEEN /hpf (ref 0–2)

## 2019-05-17 ENCOUNTER — Other Ambulatory Visit: Payer: Self-pay | Admitting: Internal Medicine

## 2019-05-19 ENCOUNTER — Other Ambulatory Visit: Payer: Self-pay | Admitting: Internal Medicine

## 2019-05-31 ENCOUNTER — Other Ambulatory Visit: Payer: Self-pay

## 2019-05-31 ENCOUNTER — Ambulatory Visit (INDEPENDENT_AMBULATORY_CARE_PROVIDER_SITE_OTHER): Admitting: Urology

## 2019-05-31 ENCOUNTER — Encounter: Payer: Self-pay | Admitting: Urology

## 2019-05-31 VITALS — BP 143/87 | HR 67 | Ht 70.0 in | Wt 190.0 lb

## 2019-05-31 DIAGNOSIS — C672 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: Secondary | ICD-10-CM

## 2019-05-31 LAB — URINALYSIS, COMPLETE
Bilirubin, UA: NEGATIVE
Glucose, UA: NEGATIVE
Ketones, UA: NEGATIVE
Leukocytes,UA: NEGATIVE
Nitrite, UA: NEGATIVE
Protein,UA: NEGATIVE
RBC, UA: NEGATIVE
Specific Gravity, UA: 1.01 (ref 1.005–1.030)
Urobilinogen, Ur: 0.2 mg/dL (ref 0.2–1.0)
pH, UA: 7 (ref 5.0–7.5)

## 2019-05-31 LAB — MICROSCOPIC EXAMINATION
Bacteria, UA: NONE SEEN
RBC: NONE SEEN /hpf (ref 0–2)

## 2019-05-31 MED ORDER — LIDOCAINE HCL 2 % IJ SOLN
60.0000 mL | Freq: Once | INTRAMUSCULAR | Status: AC
  Administered 2019-05-31: 1200 mg

## 2019-05-31 NOTE — Progress Notes (Signed)
.   Patient was cleaned and prepped in a sterile fashion with betadine and lidocaine 2% jelly was instilled into the urethra.  A 14FR catheter was inserted, urine return was noted 155ml, urine was yellow in color. 4ml of 2% Lidocaine was instilled into the bladder. The catheter was then removed. Patient tolerated well, no complications were noted.  Performed by: Verlene Mayer, [place]

## 2019-05-31 NOTE — Progress Notes (Signed)
   05/31/2019   CC:  Chief Complaint  Patient presents with  . Cysto    HPI: 63 year old male with history of bladder cancer and a personal history of obstructive urinary symptoms who returns the office today for fulguration for a small satellite lesion/recurrence.  He was initially seen and evaluated for an abnormal rectal exam but also noted episodes of painless gross hematuria as well as microscopic hematuria.  He underwent CT urogram which was suspicious for bladder tumor. 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  Surgical pathology consistent with pTa disease, primarily low-grade with 5% high-grade involvement.  Muscle was present and not involved.  Cystoscopy earlier this month shows an approximately 2 mm lesion adjacent to the stellate scar on the left lateral bladder wall concerning for low-grade very small recurrence.  He ultimately elected to undergo office fulguration which she presents for today.  He is on Flomax (started preoperatively), doing well from a urinary standpoint.   Blood pressure (!) 143/82, pulse 65, height 5\' 10"  (1.778 m), weight 192 lb (87.1 kg). NED. A&Ox3.   No respiratory distress   Abd soft, NT, ND Normal phallus with bilateral descended testicles  Cystoscopy Procedure Note  Patient identification was confirmed, informed consent was obtained, and patient was prepped using Betadine solution.  Lidocaine jelly was administered per urethral meatus.    Prior to this, his bladder was instilled with lidocaine solution allowed to dwell for 30 minutes.  Please see CMA note for detail.   Pre-Procedure: - Inspection reveals a normal caliber ureteral meatus.  Procedure: The flexible cystoscope was introduced without difficulty - No urethral strictures/lesions are present. - Stellate scar on posterior left bladder wall with TINY adjacent  tumor between the resection bed in the left UO measuring approximately 2 mm, round and papillary nature   - Enlarged prostate - Normal bladder neck - Bilateral ureteral orifices identified - Bladder mucosa  reveals no ulcers, tumors, or lesions - No bladder stones - No trabeculation  At this point in time, Bugbee electrocautery was used using the settings of 35 to fulgurate the lesion in question.  This is a good distance from the UO which was not compromised.  E flux was seen from the UO which was clear following the fulguration.  This was well-tolerated.  No viable residual tumor was appreciated.   Post-Procedure: - Patient tolerated the procedure well  Assessment/ Plan:  1. Malignant neoplasm of lateral wall of urinary bladder (HCC) Status post office fulguration which is well-tolerated Warning symptoms reviewed Follow-up in 3 months for repeat cystoscopy He is agreeable this plan   Return in about 3 months (around 08/30/2019) for cysto.   Hollice Espy, MD

## 2019-06-10 ENCOUNTER — Other Ambulatory Visit: Payer: Self-pay

## 2019-06-10 ENCOUNTER — Other Ambulatory Visit: Payer: Self-pay | Admitting: *Deleted

## 2019-06-10 DIAGNOSIS — J4530 Mild persistent asthma, uncomplicated: Secondary | ICD-10-CM

## 2019-06-10 MED ORDER — TAMSULOSIN HCL 0.4 MG PO CAPS: 0.4000 mg | ORAL_CAPSULE | Freq: Every day | ORAL | 11 refills | Status: DC

## 2019-06-10 MED ORDER — MONTELUKAST SODIUM 10 MG PO TABS: 10.0000 mg | ORAL_TABLET | Freq: Every day | ORAL | 1 refills | Status: DC

## 2019-06-10 NOTE — Addendum Note (Signed)
Addended by: Verlene Mayer A on: 06/10/2019 09:10 AM   Modules accepted: Orders

## 2019-06-21 ENCOUNTER — Other Ambulatory Visit: Payer: Self-pay

## 2019-06-21 ENCOUNTER — Ambulatory Visit (INDEPENDENT_AMBULATORY_CARE_PROVIDER_SITE_OTHER)

## 2019-06-21 DIAGNOSIS — Z23 Encounter for immunization: Secondary | ICD-10-CM | POA: Diagnosis not present

## 2019-08-04 ENCOUNTER — Ambulatory Visit (INDEPENDENT_AMBULATORY_CARE_PROVIDER_SITE_OTHER): Admitting: Internal Medicine

## 2019-08-04 ENCOUNTER — Encounter: Payer: Self-pay | Admitting: Internal Medicine

## 2019-08-04 ENCOUNTER — Other Ambulatory Visit: Payer: Self-pay | Admitting: Internal Medicine

## 2019-08-04 DIAGNOSIS — J4520 Mild intermittent asthma, uncomplicated: Secondary | ICD-10-CM

## 2019-08-04 NOTE — Progress Notes (Signed)
[facility] Consultation     I connected with the patient by telephone enabled telemedicine visit and verified that I am speaking with the correct person using two identifiers.    I discussed the limitations, risks, security and privacy concerns of performing an evaluation and management service by telemedicine and the availability of in-person appointments. I also discussed with the patient that there may be a patient responsible charge related to this service. The patient expressed understanding and agreed to proceed.  PATIENT AGREES AND CONFIRMS -YES   Other persons participating in the visit and their role in the encounter: Patient, nursing  This visit type was conducted due to national recommendations for restrictions regarding the COVID-19 Pandemic (e.g. social distancing).  This format is felt to be most appropriate for this patient at this time.  All issues noted in this document were discussed and addressed.        Date: 08/04/2019,   MRN# [id number] [facility] 04-29-1956    Admission                  Current  Nathan Anthony is a 64 y.o. old male seen in consultation for ASTHMA at the request of Dr. Carolin Coy   Previous OV  PFT's show ratio 69% with Fev1 67% +BD response Scooping of exp limb,air trapping Findings c/w Moderate COPD   Patient has been having excessive daytime sleepiness -sleep study does NOT show evidence of OSA ONO does NOT reveal hypoxia -alpha one antitrypsin levels wnl   SOB and wheezing have improved His cat died 2 months ago-seems that his asthma and allergies are much better controlled Off of advair for 40 days and feels well  Had smoked pipe for 63 years    63 yo Dx in 1997 with ASTHMA, EIB had been on advair 100 bid for 15 years changed to 250 BID 1 year ago Dx with GERD 1996 Retired from [employer] in 2000 Had frequent ASTHMA attacks 10 years ago SLeep study negative of OSA and ONO negative for hypoxia I have  reviewed studies with patient    CHIEF COMPLAINT:   Follow up asthma   HISTORY OF PRESENT ILLNESS   No  exacerbation at this time No evidence of heart failure at this time No evidence or signs of infection at this time No respiratory distress No fevers, chills, nausea, vomiting, diarrhea No evidence of lower extremity edema No evidence hemoptysis  ASTHMA well controlled at this time Dymista for allergic rhinitis but  uses Nasal Chrome from [employer]-works well        Current Medication:  Current Outpatient Medications:  .  Adalimumab (HUMIRA PEN) 40 MG/0.8ML PNKT, Inject 40 mg into the skin every 14 (fourteen) days., Disp: , Rfl:  .  alendronate (FOSAMAX) 70 MG tablet, Take 70 mg by mouth once a week., Disp: , Rfl:  .  Azelastine-Fluticasone (DYMISTA) 137-50 MCG/ACT SUSP, Place 1 spray into the nose 2 (two) times daily., Disp: 1 Bottle, Rfl: 0 .  calcium-vitamin D 250-100 MG-UNIT tablet, Take 1 tablet by mouth 2 (two) times daily., Disp: , Rfl:  .  fluocinonide cream (LIDEX) 0.05 %, Apply topically 2 (two) times daily., Disp: 30 g, Rfl: 0 .  loratadine (CLARITIN) 10 MG tablet, Take 10 mg by mouth daily., Disp: , Rfl:  .  meloxicam (MOBIC) 15 MG tablet, Take 15 mg by mouth daily., Disp: , Rfl:  .  montelukast (SINGULAIR) 10 MG tablet, Take 1 tablet (10 mg total)  by mouth daily., Disp: 90 tablet, Rfl: 1 .  Multiple Vitamin (MULTIVITAMIN) capsule, Take 1 capsule by mouth daily., Disp: , Rfl:  .  omeprazole (PRILOSEC) 20 MG capsule, TAKE 1 CAPSULE DAILY, Disp: 90 capsule, Rfl: 3 .  PROAIR HFA 108 (90 Base) MCG/ACT inhaler, USE 1 INHALATION EVERY 6 HOURS AS NEEDED FOR WHEEZING OR SHORTNESS OF BREATH, Disp: 25.5 g, Rfl: 0 .  rosuvastatin (CRESTOR) 10 MG tablet, TAKE 1 TABLET DAILY, Disp: 90 tablet, Rfl: 3 .  tamsulosin (FLOMAX) 0.4 MG CAPS capsule, Take 1 capsule (0.4 mg total) by mouth daily., Disp: 90 capsule, Rfl: 11    ALLERGIES   Pravastatin and Simvastatin     REVIEW  OF SYSTEMS   Review of Systems  Constitutional: Negative for chills, diaphoresis, fever, malaise/fatigue and weight loss.  HENT: Negative for congestion and hearing loss.   Eyes: Negative for blurred vision and double vision.  Respiratory: Negative for cough, shortness of breath and wheezing.   Cardiovascular: Negative for chest pain, palpitations and orthopnea.  Gastrointestinal: Negative for heartburn.  Skin: Negative for rash.  Neurological: Negative for weakness.  All other systems reviewed and are negative.       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  The importance of social distancing was discussed today. Hand Washing Techniques and avoid touching face was advised.  MEDICATION ADJUSTMENTS/LABS AND TESTS ORDERED: Albuterol as needed  CURRENT MEDICATIONS REVIEWED AT LENGTH WITH PATIENT TODAY   Patient satisfied with Plan of action and management. All questions answered  Follow up in 1 year  Total time Spent 22 mins   Berlie Persky Patricia Pesa, M.D.  [facility]  Medical Director [place] Director [facility] Cardio-Pulmonary Department

## 2019-08-04 NOTE — Patient Instructions (Signed)
MEDICATION ADJUSTMENTS/LABS AND TESTS ORDERED: Albuterol as needed

## 2019-09-06 ENCOUNTER — Other Ambulatory Visit: Payer: Self-pay

## 2019-09-06 ENCOUNTER — Encounter: Payer: Self-pay | Admitting: Urology

## 2019-09-06 ENCOUNTER — Ambulatory Visit (INDEPENDENT_AMBULATORY_CARE_PROVIDER_SITE_OTHER): Admitting: Urology

## 2019-09-06 VITALS — BP 156/95 | HR 73 | Ht 70.0 in | Wt 194.0 lb

## 2019-09-06 DIAGNOSIS — C672 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: Secondary | ICD-10-CM | POA: Diagnosis not present

## 2019-09-06 LAB — URINALYSIS, COMPLETE
Bilirubin, UA: NEGATIVE
Glucose, UA: NEGATIVE
Ketones, UA: NEGATIVE
Nitrite, UA: NEGATIVE
Protein,UA: NEGATIVE
RBC, UA: NEGATIVE
Specific Gravity, UA: 1.02 (ref 1.005–1.030)
Urobilinogen, Ur: 0.2 mg/dL (ref 0.2–1.0)
pH, UA: 6.5 (ref 5.0–7.5)

## 2019-09-06 LAB — MICROSCOPIC EXAMINATION
Bacteria, UA: NONE SEEN
RBC: NONE SEEN /hpf (ref 0–2)

## 2019-09-06 NOTE — Progress Notes (Signed)
   09/06/2019   CC:  Chief Complaint  Patient presents with  . Cysto    HPI: 64 year old male with history of bladder cancer and a personal history of obstructive urinary symptoms who returns the office today for surveillance cystoscopy.  He was initially seen and evaluated for an abnormal rectal exam but also noted episodes of painless gross hematuria as well as microscopic hematuria.  He underwent CT urogram which was suspicious for bladder tumor. 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  Surgical pathology consistent with pTa disease, primarily low-grade with 5% high-grade involvement.  Muscle was present and not involved.  Cystoscopy 05/2019 showed an approximately 2 mm lesion adjacent to the stellate scar on the left lateral bladder wall concerning for low-grade very small recurrence.  He ultimately elected to undergo office fulguration which was well-tolerated.  He is on Flomax (started preoperatively), doing well from a urinary standpoint.   Blood pressure (!) 143/82, pulse 65, height 5\' 10"  (1.778 m), weight 192 lb (87.1 kg). NED. A&Ox3.   No respiratory distress   Abd soft, NT, ND Normal phallus with bilateral descended testicles  Cystoscopy Procedure Note  Patient identification was confirmed, informed consent was obtained, and patient was prepped using Betadine solution.  Lidocaine jelly was administered per urethral meatus.     Pre-Procedure: - Inspection reveals a normal caliber ureteral meatus.  Procedure: The flexible cystoscope was introduced without difficulty - No urethral strictures/lesions are present. - Stellate scar on posterior left bladder wall with  - Enlarged prostate with median lboe - Normal bladder neck - Bilateral ureteral orifices identified - Bladder mucosa  reveals no ulcers, tumors, or lesions - No bladder stones - No  trabeculation -Small area, < 1 mm at the dome of the bladder slightly posterior to the level in the midline which is slightly heaped up but no obvious papillary change.  Post-Procedure: - Patient tolerated the procedure well  Assessment/ Plan:  1. Malignant neoplasm of lateral wall of urinary bladder (HCC) Tiny area near the dome which is not particularly suspicious but will continue to follow Follow-up in 3 months for repeat cystoscopy He is agreeable this plan   Return in about 3 months (around 12/05/2019) for cysto.   Hollice Espy, MD

## 2019-09-11 ENCOUNTER — Encounter: Payer: Self-pay | Admitting: Internal Medicine

## 2019-09-12 ENCOUNTER — Other Ambulatory Visit: Payer: Self-pay

## 2019-09-12 DIAGNOSIS — R0989 Other specified symptoms and signs involving the circulatory and respiratory systems: Secondary | ICD-10-CM

## 2019-09-12 NOTE — Telephone Encounter (Signed)
Should pt be scheduled to discuss abx?

## 2019-09-19 ENCOUNTER — Ambulatory Visit: Attending: Internal Medicine

## 2019-09-19 DIAGNOSIS — Z20822 Contact with and (suspected) exposure to covid-19: Secondary | ICD-10-CM

## 2019-09-20 LAB — NOVEL CORONAVIRUS, NAA: SARS-CoV-2, NAA: NOT DETECTED

## 2019-09-23 ENCOUNTER — Encounter: Payer: Self-pay | Admitting: Internal Medicine

## 2019-09-23 DIAGNOSIS — J342 Deviated nasal septum: Secondary | ICD-10-CM | POA: Insufficient documentation

## 2019-12-05 NOTE — Progress Notes (Signed)
   12/06/19  CC:  Chief Complaint  Patient presents with  . Cysto    HPI: Nathan Anthony is a 64 y.o. with history of bladder cancer and obstructive urinary symptoms returns today for a surveillance cystoscopy.   He was initially seen and evaluated for an abnormal rectal exam but also noted episodes of painless gross hematuria as well as microscopic hematuria. He underwent CT urogram which was suspicious for bladder tumor. 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 Surgical pathology consistent with pTadisease, primarily low-grade with 5% high-grade involvement. Muscle was present and not involved.  Cystoscopy 05/2019 showed an approximately 2 mm lesion adjacent to the stellate scar on the left lateral bladder wall concerning for low-grade very small recurrence.  He ultimately elected to undergo office fulguration which was well-tolerated.  Cysto 09/06/19 showed a small area <1 mm at the dome of the bladder slightly posterior to the level in the midline which is slightly heaped up but no obvious papillary change.   He is on Flomax (started preoperatively), doing well from a urinary standpoint. Reports of incontinence which is more of an inconvenience than bothersome.  He is primarily bothered by weak urinary stream.  Denies gross hematuria.    Vitals:   12/06/19 1003  BP: (!) 115/47  Pulse: 62   NED. A&Ox3.   No respiratory distress   Abd soft, NT, ND Normal phallus with bilateral descended testicles  Cystoscopy Procedure Note  Patient identification was confirmed, informed consent was obtained, and patient was prepped using Betadine solution.  Lidocaine jelly was administered per urethral meatus.     Pre-Procedure: - Inspection reveals a normal caliber ureteral meatus.  Procedure: The flexible cystoscope was introduced without difficulty - No urethral  strictures/lesions are present. - Enlarged prostate with median lobe  - Normal bladder neck - Bilateral ureteral orifices identified - Bladder mucosa  reveals no ulcers, tumors, or lesions - No bladder stones - Mild trabeculation -Previously heaped up area on dome is not appreciated today, no tumors or papillary changes  Retroflexion shows small median lobe cricumscribed   Post-Procedure: - Patient tolerated the procedure well  Assessment/ Plan:  1. History of Malignant neoplasm of lateral wall of urinary bladder Follow-up in 6 months for repeat cystoscopy He is agreeable this plan  2. BPH with bladder outlet obstruction  Continue Flomax  He did indicate today that his urinary symptoms are improved on Flomax but not as good as when he initially started this medication Discussed surgical intervention since he is a good candidate for HoLEP vs TURP given median lobe noted on cysto. (Currently building a house and does not want to do anything till fall or later, will discuss further next visit)  Return in about 6 months (around 06/06/2020) for Cysto.  Jamas Lav, am acting as a scribe for Dr. Hollice Espy,  I have reviewed the above documentation for accuracy and completeness, and I agree with the above.   Hollice Espy, MD

## 2019-12-06 ENCOUNTER — Encounter: Payer: Self-pay | Admitting: Urology

## 2019-12-06 ENCOUNTER — Ambulatory Visit (INDEPENDENT_AMBULATORY_CARE_PROVIDER_SITE_OTHER): Admitting: Urology

## 2019-12-06 ENCOUNTER — Other Ambulatory Visit: Payer: Self-pay

## 2019-12-06 VITALS — BP 115/47 | HR 62 | Ht 70.0 in | Wt 193.0 lb

## 2019-12-06 DIAGNOSIS — C672 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: Secondary | ICD-10-CM

## 2019-12-06 NOTE — Patient Instructions (Signed)

## 2019-12-07 LAB — MICROSCOPIC EXAMINATION

## 2019-12-07 LAB — URINALYSIS, COMPLETE
Bilirubin, UA: NEGATIVE
Glucose, UA: NEGATIVE
Ketones, UA: NEGATIVE
Nitrite, UA: NEGATIVE
Protein,UA: NEGATIVE
RBC, UA: NEGATIVE
Specific Gravity, UA: 1.02 (ref 1.005–1.030)
Urobilinogen, Ur: 0.2 mg/dL (ref 0.2–1.0)
pH, UA: 7 (ref 5.0–7.5)

## 2019-12-26 ENCOUNTER — Encounter: Payer: Self-pay | Admitting: Internal Medicine

## 2020-01-02 ENCOUNTER — Other Ambulatory Visit: Payer: Self-pay | Admitting: Internal Medicine

## 2020-01-02 DIAGNOSIS — J4530 Mild persistent asthma, uncomplicated: Secondary | ICD-10-CM

## 2020-01-02 NOTE — Telephone Encounter (Signed)
Requested Prescriptions  Pending Prescriptions Disp Refills  . montelukast (SINGULAIR) 10 MG tablet [Pharmacy Med Name: MONTELUKAST SODIUM TABS 10MG ] 90 tablet 3    Sig: TAKE 1 TABLET DAILY     Pulmonology:  Leukotriene Inhibitors Passed - 01/02/2020  8:17 AM      Passed - Valid encounter within last 12 months    Recent Outpatient Visits          8 months ago Annual physical exam   [facility] [name], MD   1 year ago Prostate cancer screening   [facility] [name], MD   2 years ago Annual physical exam   [facility] [name], MD   3 years ago DISH (diffuse idiopathic skeletal hyperostosis)   [facility] [name], MD      Future Appointments            In 4 months [name] Jesse Sans, MD [facility], [place]

## 2020-01-28 ENCOUNTER — Emergency Department
Admission: EM | Admit: 2020-01-28 | Discharge: 2020-01-28 | Disposition: A | Discharge status: Home / Self Care | Attending: Emergency Medicine | Admitting: Emergency Medicine

## 2020-01-28 ENCOUNTER — Other Ambulatory Visit: Payer: Self-pay

## 2020-01-28 DIAGNOSIS — Z85828 Personal history of other malignant neoplasm of skin: Secondary | ICD-10-CM | POA: Diagnosis not present

## 2020-01-28 DIAGNOSIS — Y999 Unspecified external cause status: Secondary | ICD-10-CM | POA: Insufficient documentation

## 2020-01-28 DIAGNOSIS — J45909 Unspecified asthma, uncomplicated: Secondary | ICD-10-CM | POA: Insufficient documentation

## 2020-01-28 DIAGNOSIS — S6992XA Unspecified injury of left wrist, hand and finger(s), initial encounter: Secondary | ICD-10-CM | POA: Diagnosis present

## 2020-01-28 DIAGNOSIS — S61208A Unspecified open wound of other finger without damage to nail, initial encounter: Secondary | ICD-10-CM

## 2020-01-28 DIAGNOSIS — Y9389 Activity, other specified: Secondary | ICD-10-CM | POA: Diagnosis not present

## 2020-01-28 DIAGNOSIS — S61203A Unspecified open wound of left middle finger without damage to nail, initial encounter: Secondary | ICD-10-CM | POA: Diagnosis not present

## 2020-01-28 DIAGNOSIS — Z87891 Personal history of nicotine dependence: Secondary | ICD-10-CM | POA: Diagnosis not present

## 2020-01-28 DIAGNOSIS — Z79899 Other long term (current) drug therapy: Secondary | ICD-10-CM | POA: Insufficient documentation

## 2020-01-28 DIAGNOSIS — Y929 Unspecified place or not applicable: Secondary | ICD-10-CM | POA: Diagnosis not present

## 2020-01-28 DIAGNOSIS — S61211A Laceration without foreign body of left index finger without damage to nail, initial encounter: Secondary | ICD-10-CM | POA: Diagnosis not present

## 2020-01-28 DIAGNOSIS — W312XXA Contact with powered woodworking and forming machines, initial encounter: Secondary | ICD-10-CM | POA: Insufficient documentation

## 2020-01-28 MED ORDER — CEPHALEXIN 500 MG PO CAPS: 500.0000 mg | ORAL_CAPSULE | Freq: Three times a day (TID) | ORAL | 0 refills | Status: AC

## 2020-01-28 MED ORDER — CEPHALEXIN 500 MG PO CAPS
500.0000 mg | ORAL_CAPSULE | Freq: Once | ORAL | Status: AC
  Administered 2020-01-28: 500 mg via ORAL
  Filled 2020-01-28: qty 1

## 2020-01-28 MED ORDER — BACITRACIN-NEOMYCIN-POLYMYXIN 400-5-5000 EX OINT
TOPICAL_OINTMENT | Freq: Once | CUTANEOUS | Status: AC
  Administered 2020-01-28: 1 via TOPICAL
  Filled 2020-01-28: qty 1

## 2020-01-28 NOTE — Discharge Instructions (Addendum)
You were seen today for an avulsion of your middle finger, left hand.  This did not need sutures.  We cleansed the wound for you.  I am putting you on antibiotics 3 times daily for the next 5 days to prevent infection.  Monitor for increased pain, redness, swelling or discharge.  Follow-up with your PCP if symptoms persist or worsen.

## 2020-01-28 NOTE — ED Triage Notes (Signed)
Pt comes in POV with finger injuries on left hand from a table saw. Wrapped up in wash cloth bleeding controlled at this time. Middle finger injured the most. Lacs on first 3 fingers and small one on pinky.

## 2020-01-28 NOTE — ED Notes (Signed)
Ointment applied to right middle finger, gauze wrap applied.

## 2020-01-28 NOTE — ED Notes (Signed)
Unable to obtain written discharge consent- verbal consent obtained.

## 2020-01-28 NOTE — ED Provider Notes (Signed)
[facility] Emergency Department Provider Note ____________________________________________  Time seen: 1745  I have reviewed the triage vital signs and the nursing notes.  HISTORY  Chief Complaint  Finger Injury   HPI Nathan Anthony is a 64 y.o. male presents to the ER second third fingers on his left hand.  He reports approximately 2-1/2 hours ago he was using a table saw which ran across the second and third fingers on his left hand.  He was able to control the bleeding at home.  His last tetanus was 12/2014.  Past Medical History:  Diagnosis Date  . Allergy   . Asthma   . 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 Chronic obstructive pulmonary disease ([place]) 04/22/2018  . Complication of anesthesia    Asthmatic episode when awakening from colonoscopy  . DISH (diffuse idiopathic skeletal hyperostosis)   . GERD (gastroesophageal reflux disease)   . Hyperlipidemia   . Hyperlipidemia   . Inguinal hernia    Left Side  . Osteopenia   . Osteoporosis   . RA (rheumatoid arthritis) ([place])   . Rheumatoid arthritis ([place])   . Sciatica   . Sciatica   . Sciatica   . Tinnitus aurium, left     Patient Active Problem List   Diagnosis Date Noted  . Acquired deviated nasal septum 09/23/2019  . BPH with obstruction/lower urinary tract symptoms 04/22/2018  . DISH (diffuse idiopathic skeletal hyperostosis) 11/14/2016  . 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 Unilateral inguinal hernia without obstruction or gangrene 05/22/2015  . GERD (gastroesophageal reflux disease) 11/15/2014  . Hyperlipidemia 11/15/2014  . Mild persistent asthma without complication 123456  . Osteoporosis without current pathological fracture 11/15/2014  . 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 BASAL CELL CARCINOMA EXCISION    . COLONOSCOPY  06/06/2010  . HERNIA  REPAIR    . TRANSURETHRAL RESECTION OF BLADDER TUMOR WITH MITOMYCIN-C N/A 06/21/2018   Procedure: TRANSURETHRAL RESECTION OF BLADDER TUMOR WITH Gemcitabine;  Surgeon: Hollice Espy, MD;  Location: [facility] ORS;  Service: Urology;  Laterality: N/A;  . VARICOCELECTOMY    . VARICOCELECTOMY      Prior to Admission medications   Medication Sig Start Date End Date Taking? Authorizing Provider  Adalimumab (HUMIRA PEN) 40 MG/0.8ML PNKT Inject 40 mg into the skin every 14 (fourteen) days. 03/07/16   [provider]  alendronate (FOSAMAX) 70 MG tablet Take 70 mg by mouth once a week. 10/26/17   [provider]  calcium-vitamin D 250-100 MG-UNIT tablet Take 1 tablet by mouth 2 (two) times daily.    [provider]  cephALEXin (KEFLEX) 500 MG capsule Take 1 capsule (500 mg total) by mouth 3 (three) times daily for 10 days. 01/28/20 02/07/20  Jearld Fenton, NP  fluocinonide cream (LIDEX) 0.05 % Apply topically 2 (two) times daily. 04/27/19   [name], MD  loratadine (CLARITIN) 10 MG tablet Take 10 mg by mouth daily.    [provider]  meloxicam (MOBIC) 15 MG tablet Take 15 mg by mouth daily.    [provider]  montelukast (SINGULAIR) 10 MG tablet TAKE 1 TABLET DAILY 01/02/20   [name], MD  Multiple Vitamin (MULTIVITAMIN) capsule Take 1 capsule by mouth daily.    [provider]  omeprazole (PRILOSEC) 20 MG capsule TAKE 1 CAPSULE DAILY 05/17/19   [name], MD  [facility]  HFA 108 (90 Base) MCG/ACT inhaler USE 1 INHALATION EVERY 6 HOURS AS NEEDED FOR WHEEZING OR SHORTNESS OF BREATH 08/04/19   Flora Lipps, MD  rosuvastatin (CRESTOR) 10 MG tablet TAKE 1 TABLET DAILY 05/19/19   [name], MD  tamsulosin (FLOMAX) 0.4 MG CAPS capsule Take 1 capsule (0.4 mg total) by mouth daily. 06/10/19   Hollice Espy, MD  omeprazole (PRILOSEC) 20 MG capsule TAKE 1 CAPSULE DAILY 05/13/18   [name], MD    Allergies Pravastatin and  Simvastatin  Family History  Problem Relation Age of Onset  . Heart disease Mother   . Asthma Mother   . Heart disease Brother   . Ankylosing spondylitis Brother     Social History Social History   Tobacco Use  . Smoking status: Former Smoker    Years: 5.00    Types: Pipe  . Smokeless tobacco: Never Used  Substance Use Topics  . Alcohol use: No  . Drug use: No    Review of Systems  Constitutional: Negative for fever, chills or body aches. Cardiovascular: Negative for chest pain or chest tightness. Respiratory: Negative for shortness of breath. Musculoskeletal: Negative for decrease in ROM of fingers. Skin: Positive for laceration of 2nd and 3rd fingers, left hand. Neurological: Negative for focal weakness, tingling or numbness. ____________________________________________  PHYSICAL EXAM:  VITAL SIGNS: ED Triage Vitals  Enc Vitals Group     BP 01/28/20 1452 (!) 145/63     Pulse Rate 01/28/20 1452 68     Resp 01/28/20 1452 18     Temp 01/28/20 1452 98.6 F (37 C)     Temp Source 01/28/20 1452 Oral     SpO2 01/28/20 1452 96 %     Weight 01/28/20 1453 190 lb (86.2 kg)     Height 01/28/20 1453 5\' 10"  (1.778 m)     Head Circumference --      Peak Flow --      Pain Score 01/28/20 1453 7     Pain Loc --      Pain Edu? --      Excl. in [place]? --     Constitutional: Alert and oriented. Well appearing and in no distress. Cardiovascular: Normal rate, regular rhythm. Radial pulses 2+ bilaterally. Cap refill < 3 secs. Respiratory: Normal respiratory effort. No wheezes/rales/rhonchi. Musculoskeletal: Normal flexion and extension of the second and third fingers, left hand.  No joint swelling noted.  No pain with palpation of the joints.  Handgrips equal. Neurologic:  Normal gait without ataxia. Normal speech and language.  Sensation intact to LUE. Skin: 1 cm oval area of skin avulsion on the pad of the third finger left  hand. ____________________________________________    INITIAL IMPRESSION / ASSESSMENT AND PLAN / ED COURSE  Skin Avulsion, 3rd Finger, Left Hand:  No need for closure with sutures or surgical glue Wound cleansed, covered with Triple Antibiotic Ointment Keflex 500 mg PO x 1 RX for Keflex 500 mg TID x 5 days ____________________________________________  FINAL CLINICAL IMPRESSION(S) / ED DIAGNOSES  Final diagnoses:  Avulsion of skin of middle finger, initial encounter      Jearld Fenton, NP 01/28/20 1806    Earleen Newport, MD 01/28/20 [id number]

## 2020-04-07 ENCOUNTER — Other Ambulatory Visit: Payer: Self-pay | Admitting: Internal Medicine

## 2020-04-07 DIAGNOSIS — J4520 Mild intermittent asthma, uncomplicated: Secondary | ICD-10-CM

## 2020-04-11 ENCOUNTER — Encounter: Payer: Self-pay | Admitting: Internal Medicine

## 2020-04-11 ENCOUNTER — Other Ambulatory Visit: Payer: Self-pay

## 2020-04-11 ENCOUNTER — Ambulatory Visit (INDEPENDENT_AMBULATORY_CARE_PROVIDER_SITE_OTHER): Admitting: Internal Medicine

## 2020-04-11 VITALS — BP 126/80 | HR 80 | Temp 98.5°F | Ht 70.0 in | Wt 185.0 lb

## 2020-04-11 DIAGNOSIS — M05711 Rheumatoid arthritis with rheumatoid factor of right shoulder without organ or systems involvement: Secondary | ICD-10-CM | POA: Diagnosis not present

## 2020-04-11 DIAGNOSIS — R233 Spontaneous ecchymoses: Secondary | ICD-10-CM | POA: Diagnosis not present

## 2020-04-11 DIAGNOSIS — L03116 Cellulitis of left lower limb: Secondary | ICD-10-CM | POA: Diagnosis not present

## 2020-04-11 DIAGNOSIS — I700 Atherosclerosis of aorta: Secondary | ICD-10-CM | POA: Insufficient documentation

## 2020-04-11 DIAGNOSIS — M05712 Rheumatoid arthritis with rheumatoid factor of left shoulder without organ or systems involvement: Secondary | ICD-10-CM | POA: Diagnosis not present

## 2020-04-11 MED ORDER — SULFAMETHOXAZOLE-TRIMETHOPRIM 800-160 MG PO TABS: 1.0000 | ORAL_TABLET | Freq: Two times a day (BID) | ORAL | 0 refills | Status: AC

## 2020-04-11 NOTE — Progress Notes (Signed)
Date:  04/11/2020   Name:  Nathan Anthony   DOB:  1956/04/16   MRN:  [id number]   Chief Complaint: Edema (Swollen feet and ankles with red spots. Ankles are warm and left anke is sore to touch. Trimmed toenails Friday.  Noticed the red spots Sunday after working in the yard with high rubber boots and long pants. When mowing the yard he wore shorts. )  Rash This is a new problem. The current episode started in the past 7 days. The problem is unchanged. The affected locations include the left lower leg, left upper leg, right upper leg, right lower leg, right ankle and left ankle. The rash is characterized by redness (not painful or itching). He was exposed to nothing. Pertinent negatives include no fatigue, fever or shortness of breath. Past treatments include nothing.   Ankle redness/swelling/pain - he noticed his ankle was swollen, red and warm yesterday.  It is not worse today.  He denies trauma, or unusual activity.  No fever or chills.  No calf pain or swelling.  Lab Results  Component Value Date   CREATININE 0.86 04/27/2019   BUN 17 04/27/2019   NA 140 04/27/2019   K 4.9 04/27/2019   CL 102 04/27/2019   CO2 22 04/27/2019   Lab Results  Component Value Date   CHOL 157 04/27/2019   HDL 35 (L) 04/27/2019   LDLCALC 87 04/27/2019   TRIG 174 (H) 04/27/2019   CHOLHDL 4.5 04/27/2019   No results found for: TSH No results found for: HGBA1C Lab Results  Component Value Date   WBC 9.0 04/27/2019   HGB 13.4 04/27/2019   HCT 42.0 04/27/2019   MCV 87 04/27/2019   PLT 356 04/27/2019   Lab Results  Component Value Date   ALT 16 04/27/2019   AST 20 04/27/2019   ALKPHOS 70 04/27/2019   BILITOT 0.4 04/27/2019     Review of Systems  Constitutional: Negative for chills, fatigue and fever.  Respiratory: Negative for chest tightness and shortness of breath.   Cardiovascular: Positive for leg swelling (left ankle but not calf). Negative for chest pain.  Musculoskeletal:  Positive for arthralgias and joint swelling.  Skin: Positive for color change and rash.  Neurological: Negative for dizziness and headaches.  Psychiatric/Behavioral: Negative for sleep disturbance.    Patient Active Problem List   Diagnosis Date Noted  . Abdominal aortic atherosclerosis ([place]) 04/11/2020  . Acquired deviated nasal septum 09/23/2019  . BPH with obstruction/lower urinary tract symptoms 04/22/2018  . DISH (diffuse idiopathic skeletal hyperostosis) 11/14/2016  . 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 Unilateral inguinal hernia without obstruction or gangrene 05/22/2015  . GERD (gastroesophageal reflux disease) 11/15/2014  . Hyperlipidemia 11/15/2014  . Mild persistent asthma without complication 02/40/9735  . Osteoporosis without current pathological fracture 11/15/2014  . 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 Pravastatin     Other reaction(s): Nausea Only  . Simvastatin     Other reaction(s): Nausea Only    Past Surgical History:  Procedure Laterality Date  . BASAL CELL CARCINOMA EXCISION    . COLONOSCOPY  06/06/2010  . HERNIA REPAIR    . TRANSURETHRAL RESECTION OF BLADDER TUMOR WITH MITOMYCIN-C N/A 06/21/2018   Procedure: TRANSURETHRAL RESECTION OF BLADDER TUMOR WITH Gemcitabine;  Surgeon: Hollice Espy, MD;  Location: [facility] ORS;  Service: Urology;  Laterality: N/A;  . VARICOCELECTOMY    . VARICOCELECTOMY  Social History   Tobacco Use  . Smoking status: Former Smoker    Years: 5.00    Types: Pipe  . Smokeless tobacco: Never Used  Vaping Use  . Vaping Use: Never used  Substance Use Topics  . Alcohol use: No  . Drug use: No     Medication list has been reviewed and updated.  Current Meds  Medication Sig  . Adalimumab (HUMIRA PEN) 40 MG/0.8ML PNKT Inject 40 mg into the skin every 14 (fourteen) days.  Marland Kitchen alendronate (FOSAMAX) 70 MG tablet Take  70 mg by mouth once a week.  . calcium-vitamin D 250-100 MG-UNIT tablet Take 1 tablet by mouth 2 (two) times daily.  . fluocinonide cream (LIDEX) 0.05 % Apply topically 2 (two) times daily.  Marland Kitchen loratadine (CLARITIN) 10 MG tablet Take 10 mg by mouth daily.  . meloxicam (MOBIC) 15 MG tablet Take 15 mg by mouth daily.  . montelukast (SINGULAIR) 10 MG tablet TAKE 1 TABLET DAILY  . Multiple Vitamin (MULTIVITAMIN) capsule Take 1 capsule by mouth daily.  Marland Kitchen omeprazole (PRILOSEC) 20 MG capsule TAKE 1 CAPSULE DAILY  . PROAIR HFA 108 (90 Base) MCG/ACT inhaler USE 1 INHALATION EVERY 6 HOURS AS NEEDED FOR WHEEZING OR SHORTNESS OF BREATH (NEEDS APPOINTMENT FOR FURTHER REFILLS)  . rosuvastatin (CRESTOR) 10 MG tablet TAKE 1 TABLET DAILY  . tamsulosin (FLOMAX) 0.4 MG CAPS capsule Take 1 capsule (0.4 mg total) by mouth daily.    PHQ 2/9 Scores 04/27/2019 04/22/2018 04/22/2017  PHQ - 2 Score 0 0 0    No flowsheet data found.  BP Readings from Last 3 Encounters:  04/11/20 126/80  01/28/20 (!) 144/69  12/06/19 (!) 115/47    Physical Exam Vitals and nursing note reviewed.  Constitutional:      General: He is not in acute distress.    Appearance: He is well-developed.  HENT:     Head: Normocephalic and atraumatic.  Cardiovascular:     Rate and Rhythm: Normal rate and regular rhythm.     Pulses: Normal pulses.  Pulmonary:     Effort: Pulmonary effort is normal. No respiratory distress.     Breath sounds: No wheezing or rhonchi.  Musculoskeletal:     Cervical back: Normal range of motion.     Right lower leg: No edema.     Left lower leg: No edema.     Right ankle: Normal.     Left ankle: Swelling present. Tenderness (medially with warmth and redness) present.     Comments: Calf circumference 14.5 inches bilaterally   Lymphadenopathy:     Cervical: No cervical adenopathy.  Skin:    General: Skin is warm and dry.     Findings: Petechiae (scattered over both lower legs and thighs) present. No rash.   Neurological:     Mental Status: He is alert and oriented to person, place, and time.  Psychiatric:        Behavior: Behavior normal.        Thought Content: Thought content normal.     Wt Readings from Last 3 Encounters:  04/11/20 185 lb (83.9 kg)  01/28/20 190 lb (86.2 kg)  12/06/19 193 lb (87.5 kg)    BP 126/80   Pulse 80   Temp 98.5 F (36.9 C) (Oral)   Ht 5\' 10"  (1.778 m)   Wt 185 lb (83.9 kg)   SpO2 95%   BMI 26.54 kg/m   Assessment and Plan: 1. Cellulitis of left lower extremity Elevate to reduce  swelling Treat with Bactrim for MRSA coverage - sulfamethoxazole-trimethoprim (BACTRIM DS) 800-160 MG tablet; Take 1 tablet by mouth 2 (two) times daily for 10 days.  Dispense: 20 tablet; Refill: 0  2. Petechial rash Uncertain cause - monitor for worsening  3. 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 Any errors are unintentional.  Halina Maidens, MD [facility]  04/11/2020

## 2020-04-12 ENCOUNTER — Encounter: Payer: Self-pay | Admitting: Internal Medicine

## 2020-04-23 ENCOUNTER — Other Ambulatory Visit

## 2020-04-30 ENCOUNTER — Other Ambulatory Visit: Payer: Self-pay

## 2020-04-30 ENCOUNTER — Other Ambulatory Visit

## 2020-04-30 DIAGNOSIS — Z20822 Contact with and (suspected) exposure to covid-19: Secondary | ICD-10-CM

## 2020-05-02 LAB — NOVEL CORONAVIRUS, NAA: SARS-CoV-2, NAA: NOT DETECTED

## 2020-05-03 ENCOUNTER — Ambulatory Visit (INDEPENDENT_AMBULATORY_CARE_PROVIDER_SITE_OTHER): Admitting: Internal Medicine

## 2020-05-03 ENCOUNTER — Encounter: Payer: Self-pay | Admitting: Internal Medicine

## 2020-05-03 ENCOUNTER — Other Ambulatory Visit: Payer: Self-pay

## 2020-05-03 VITALS — BP 138/86 | HR 70 | Temp 97.8°F | Ht 70.0 in | Wt 189.0 lb

## 2020-05-03 DIAGNOSIS — K219 Gastro-esophageal reflux disease without esophagitis: Secondary | ICD-10-CM

## 2020-05-03 DIAGNOSIS — I700 Atherosclerosis of aorta: Secondary | ICD-10-CM | POA: Diagnosis not present

## 2020-05-03 DIAGNOSIS — C672 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: Secondary | ICD-10-CM

## 2020-05-03 DIAGNOSIS — J984 Other disorders of lung: Secondary | ICD-10-CM

## 2020-05-03 DIAGNOSIS — J8410 Pulmonary fibrosis, unspecified: Secondary | ICD-10-CM

## 2020-05-03 DIAGNOSIS — Z23 Encounter for immunization: Secondary | ICD-10-CM | POA: Diagnosis not present

## 2020-05-03 DIAGNOSIS — Z125 Encounter for screening for malignant neoplasm of prostate: Secondary | ICD-10-CM

## 2020-05-03 DIAGNOSIS — Z1211 Encounter for screening for malignant neoplasm of colon: Secondary | ICD-10-CM | POA: Diagnosis not present

## 2020-05-03 DIAGNOSIS — J4530 Mild persistent asthma, uncomplicated: Secondary | ICD-10-CM

## 2020-05-03 DIAGNOSIS — Z0000 Encounter for general adult medical examination without abnormal findings: Secondary | ICD-10-CM

## 2020-05-03 LAB — POCT URINALYSIS DIPSTICK
Bilirubin, UA: NEGATIVE
Blood, UA: NEGATIVE
Glucose, UA: NEGATIVE
Ketones, UA: NEGATIVE
Leukocytes, UA: NEGATIVE
Nitrite, UA: NEGATIVE
Protein, UA: NEGATIVE
Spec Grav, UA: <=1.005 — AB (ref 1.010–1.025)
Urobilinogen, UA: 0.2 E.U./dL
pH, UA: 7 (ref 5.0–8.0)

## 2020-05-03 MED ORDER — OMEPRAZOLE 20 MG PO CPDR: 20.0000 mg | DELAYED_RELEASE_CAPSULE | Freq: Every day | ORAL | 3 refills | Status: DC

## 2020-05-03 MED ORDER — ROSUVASTATIN CALCIUM 10 MG PO TABS: 10.0000 mg | ORAL_TABLET | Freq: Every day | ORAL | 3 refills | Status: DC

## 2020-05-03 NOTE — Progress Notes (Signed)
Date:  05/03/2020   Name:  Nathan Anthony   DOB:  13-May-1956   MRN:  [id number]   Chief Complaint: Annual Exam (flu shot )  Nathan Anthony is a 64 y.o. male who presents today for his Complete Annual Exam. He feels well. He reports exercising x1 day a week mows yard,walking  X3 days a week 1-1.5 miles . He reports he is sleeping well. His cellulitis has resolved with bactrim.  The petechiae are also resolving.  Colonoscopy: 06/2010  Immunization History  Administered Date(s) Administered  . Influenza,inj,Quad PF,6+ Mos 05/22/2015, 08/07/2016, 06/25/2018, 06/21/2019  . Janssen (J&J) SARS-COV-2 Vaccination 12/12/2019  . Pneumococcal Polysaccharide-23 04/22/2017  . Tdap 12/12/2014    Gastroesophageal Reflux He complains of heartburn. He reports no abdominal pain, no chest pain, no choking or no wheezing. This is a recurrent problem. The problem occurs occasionally. Pertinent negatives include no fatigue. He has tried a PPI for the symptoms. The treatment provided significant relief.  Asthma There is no shortness of breath or wheezing. Associated symptoms include heartburn. Pertinent negatives include no appetite change, chest pain, headaches, myalgias or trouble swallowing. His symptoms are alleviated by beta-agonist and leukotriene antagonist. He reports significant improvement on treatment. His past medical history is significant for asthma.  Hyperlipidemia This is a chronic problem. The problem is controlled. Pertinent negatives include no chest pain, myalgias or shortness of breath. Current antihyperlipidemic treatment includes statins. The current treatment provides significant improvement of lipids. Risk factors: aortic atherosclerosis.    Lab Results  Component Value Date   CREATININE 0.86 04/27/2019   BUN 17 04/27/2019   NA 140 04/27/2019   K 4.9 04/27/2019   CL 102 04/27/2019   CO2 22 04/27/2019   Lab Results  Component Value Date   CHOL 157 04/27/2019   HDL 35  (L) 04/27/2019   LDLCALC 87 04/27/2019   TRIG 174 (H) 04/27/2019   CHOLHDL 4.5 04/27/2019   No results found for: TSH No results found for: HGBA1C Lab Results  Component Value Date   WBC 9.0 04/27/2019   HGB 13.4 04/27/2019   HCT 42.0 04/27/2019   MCV 87 04/27/2019   PLT 356 04/27/2019   Lab Results  Component Value Date   ALT 16 04/27/2019   AST 20 04/27/2019   ALKPHOS 70 04/27/2019   BILITOT 0.4 04/27/2019     Review of Systems  Constitutional: Negative for appetite change, chills, diaphoresis, fatigue and unexpected weight change.  HENT: Positive for tinnitus (left ear ). Negative for hearing loss, trouble swallowing and voice change.   Eyes: Negative for visual disturbance.  Respiratory: Negative for choking, shortness of breath and wheezing.   Cardiovascular: Negative for chest pain, palpitations and leg swelling.  Gastrointestinal: Positive for heartburn. Negative for abdominal pain, blood in stool, constipation and diarrhea.  Genitourinary: Positive for difficulty urinating (sees urologist ). Negative for dysuria and frequency.  Musculoskeletal: Positive for back pain (mid back pain X2 weeks). Negative for arthralgias and myalgias.  Skin: Negative for color change and rash.  Neurological: Negative for dizziness, syncope and headaches.  Hematological: Negative for adenopathy.  Psychiatric/Behavioral: Negative for dysphoric mood and sleep disturbance.    Patient Active Problem List   Diagnosis Date Noted  . 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 Abdominal aortic atherosclerosis ([place]) 04/11/2020  . Acquired deviated nasal septum 09/23/2019  . BPH with obstruction/lower urinary tract symptoms 04/22/2018  . DISH (diffuse idiopathic skeletal hyperostosis) 11/14/2016  .  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[place]) 02/06/2016  . Unilateral inguinal hernia without obstruction or gangrene 05/22/2015  . GERD (gastroesophageal reflux disease) 11/15/2014  .  Hyperlipidemia 11/15/2014  . Mild persistent asthma without complication 61/95/0932  . Osteoporosis without current pathological fracture 11/15/2014  . 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 Pravastatin     Other reaction(s): Nausea Only  . Simvastatin     Other reaction(s): Nausea Only    Past Surgical History:  Procedure Laterality Date  . BASAL CELL CARCINOMA EXCISION    . COLONOSCOPY  06/06/2010  . HERNIA REPAIR    . TRANSURETHRAL RESECTION OF BLADDER TUMOR WITH MITOMYCIN-C N/A 06/21/2018   Procedure: TRANSURETHRAL RESECTION OF BLADDER TUMOR WITH Gemcitabine;  Surgeon: Hollice Espy, MD;  Location: [facility] ORS;  Service: Urology;  Laterality: N/A;  . VARICOCELECTOMY    . VARICOCELECTOMY      Social History   Tobacco Use  . Smoking status: Former Smoker    Years: 5.00    Types: Pipe  . Smokeless tobacco: Never Used  Vaping Use  . Vaping Use: Never used  Substance Use Topics  . Alcohol use: No  . Drug use: No     Medication list has been reviewed and updated.  Current Meds  Medication Sig  . Adalimumab (HUMIRA PEN) 40 MG/0.8ML PNKT Inject 40 mg into the skin every 14 (fourteen) days.  Marland Kitchen alendronate (FOSAMAX) 70 MG tablet Take 70 mg by mouth once a week.  . calcium-vitamin D 250-100 MG-UNIT tablet Take 1 tablet by mouth 2 (two) times daily.  . fluocinonide cream (LIDEX) 0.05 % Apply topically 2 (two) times daily.  Marland Kitchen loratadine (CLARITIN) 10 MG tablet Take 10 mg by mouth daily.  . meloxicam (MOBIC) 15 MG tablet Take 15 mg by mouth daily.  . montelukast (SINGULAIR) 10 MG tablet TAKE 1 TABLET DAILY  . Multiple Vitamin (MULTIVITAMIN) capsule Take 1 capsule by mouth daily.  Marland Kitchen omeprazole (PRILOSEC) 20 MG capsule TAKE 1 CAPSULE DAILY  . PROAIR HFA 108 (90 Base) MCG/ACT inhaler USE 1 INHALATION EVERY 6 HOURS AS NEEDED FOR WHEEZING OR SHORTNESS OF BREATH (NEEDS APPOINTMENT FOR  FURTHER REFILLS)  . rosuvastatin (CRESTOR) 10 MG tablet TAKE 1 TABLET DAILY  . tamsulosin (FLOMAX) 0.4 MG CAPS capsule Take 1 capsule (0.4 mg total) by mouth daily.  Marland Kitchen triamcinolone (NASACORT) 55 MCG/ACT AERO nasal inhaler Place 2 sprays into the nose daily.    PHQ 2/9 Scores 05/03/2020 04/27/2019 04/22/2018 04/22/2017  PHQ - 2 Score 0 0 0 0  PHQ- 9 Score 0 - - -    No flowsheet data found.  BP Readings from Last 3 Encounters:  05/03/20 138/86  04/11/20 126/80  01/28/20 (!) 144/69    Physical Exam Vitals and nursing note reviewed.  Constitutional:      Appearance: Normal appearance. He is well-developed.  HENT:     Head: Normocephalic.     Right Ear: Tympanic membrane, ear canal and external ear normal.     Left Ear: Tympanic membrane, ear canal and external ear normal.     Nose:     Right Sinus: Maxillary sinus tenderness and frontal sinus tenderness present.     Left Sinus: Maxillary sinus tenderness and frontal sinus tenderness present.     Comments: Nasal voice and mouth breathing    Mouth/Throat:     Pharynx: Uvula midline.  Eyes:     Conjunctiva/sclera: Conjunctivae normal.  Pupils: Pupils are equal, round, and reactive to light.  Neck:     Thyroid: No thyromegaly.     Vascular: No carotid bruit.  Cardiovascular:     Rate and Rhythm: Normal rate and regular rhythm.     Heart sounds: Normal heart sounds.  Pulmonary:     Effort: Pulmonary effort is normal.     Breath sounds: Normal breath sounds. No wheezing.  Chest:     Breasts:        Right: No mass.        Left: No mass.  Abdominal:     General: Bowel sounds are normal.     Palpations: Abdomen is soft.     Tenderness: There is no abdominal tenderness.  Musculoskeletal:        General: Normal range of motion.     Cervical back: Normal range of motion and neck supple.  Lymphadenopathy:     Cervical: No cervical adenopathy.  Skin:    General: Skin is warm and dry.     Capillary Refill: Capillary refill  takes less than 2 seconds.  Neurological:     Mental Status: He is alert and oriented to person, place, and time.     Deep Tendon Reflexes: Reflexes are normal and symmetric.  Psychiatric:        Attention and Perception: Attention normal.     Wt Readings from Last 3 Encounters:  05/03/20 189 lb (85.7 kg)  04/11/20 185 lb (83.9 kg)  01/28/20 190 lb (86.2 kg)    BP 138/86   Pulse 70   Temp 97.8 F (36.6 C) (Oral)   Ht 5\' 10"  (1.778 m)   Wt 189 lb (85.7 kg)   SpO2 96%   BMI 27.12 kg/m   Assessment and Plan: 1. Annual physical exam Normal exam Continue healthy diet, exercise Immunizations up to date - POCT urinalysis dipstick  2. Colon cancer screening Due for 10 yr repeat - Ambulatory referral to Gastroenterology  3. Prostate cancer screening DRE deferred to Urology For BPH sx can increase Flomax to 2 at HS - PSA  4. Mild persistent asthma without complication Doing well currently without recent exacerbation Followed by Pulmonary On Singulair daily and PRN albuterol  5. Abdominal aortic atherosclerosis (H[facility]) Recommend ASA 81 mg daily and continued statin  - Comprehensive metabolic panel - Lipid panel  6. Gastroesophageal reflux disease, unspecified whether esophagitis present Symptoms well controlled on daily PPI No red flag signs such as weight loss, n/v, melena Will continue omeprazole - CBC with Differential/Platelet  7. 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H[facility]) Followed by Pulmonary  8. 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 Any errors are unintentional.  Halina Maidens, MD [facility]  05/03/2020

## 2020-05-04 LAB — COMPREHENSIVE METABOLIC PANEL
ALT: 15 IU/L (ref 0–44)
AST: 20 IU/L (ref 0–40)
Albumin/Globulin Ratio: 1.3 (ref 1.2–2.2)
Albumin: 4.3 g/dL (ref 3.8–4.8)
Alkaline Phosphatase: 81 IU/L (ref 48–121)
BUN/Creatinine Ratio: 12 (ref 10–24)
BUN: 10 mg/dL (ref 8–27)
Bilirubin Total: 0.3 mg/dL (ref 0.0–1.2)
CO2: 24 mmol/L (ref 20–29)
Calcium: 9.4 mg/dL (ref 8.6–10.2)
Chloride: 102 mmol/L (ref 96–106)
Creatinine, Ser: 0.82 mg/dL (ref 0.76–1.27)
GFR calc Af Amer: 109 mL/min/{1.73_m2} (ref >59)
GFR calc non Af Amer: 94 mL/min/{1.73_m2} (ref >59)
Globulin, Total: 3.4 g/dL (ref 1.5–4.5)
Glucose: 85 mg/dL (ref 65–99)
Potassium: 4.6 mmol/L (ref 3.5–5.2)
Sodium: 141 mmol/L (ref 134–144)
Total Protein: 7.7 g/dL (ref 6.0–8.5)

## 2020-05-04 LAB — CBC WITH DIFFERENTIAL/PLATELET
Basophils Absolute: 0.1 10*3/uL (ref 0.0–0.2)
Basos: 1 %
EOS (ABSOLUTE): 0.6 10*3/uL — ABNORMAL HIGH (ref 0.0–0.4)
Eos: 6 %
Hematocrit: 43 % (ref 37.5–51.0)
Hemoglobin: 14.4 g/dL (ref 13.0–17.7)
Immature Grans (Abs): 0 10*3/uL (ref 0.0–0.1)
Immature Granulocytes: 0 %
Lymphocytes Absolute: 2.5 10*3/uL (ref 0.7–3.1)
Lymphs: 27 %
MCH: 28.1 pg (ref 26.6–33.0)
MCHC: 33.5 g/dL (ref 31.5–35.7)
MCV: 84 fL (ref 79–97)
Monocytes Absolute: 0.7 10*3/uL (ref 0.1–0.9)
Monocytes: 7 %
Neutrophils Absolute: 5.3 10*3/uL (ref 1.4–7.0)
Neutrophils: 59 %
Platelets: 370 10*3/uL (ref 150–450)
RBC: 5.13 x10E6/uL (ref 4.14–5.80)
RDW: 13.3 % (ref 11.6–15.4)
WBC: 9.3 10*3/uL (ref 3.4–10.8)

## 2020-05-04 LAB — PSA: Prostate Specific Ag, Serum: 0.7 ng/mL (ref 0.0–4.0)

## 2020-05-04 LAB — LIPID PANEL
Chol/HDL Ratio: 5.2 ratio — ABNORMAL HIGH (ref 0.0–5.0)
Cholesterol, Total: 186 mg/dL (ref 100–199)
HDL: 36 mg/dL — ABNORMAL LOW (ref >39)
LDL Chol Calc (NIH): 108 mg/dL — ABNORMAL HIGH (ref 0–99)
Triglycerides: 240 mg/dL — ABNORMAL HIGH (ref 0–149)
VLDL Cholesterol Cal: 42 mg/dL — ABNORMAL HIGH (ref 5–40)

## 2020-05-05 ENCOUNTER — Other Ambulatory Visit: Payer: Self-pay | Admitting: Internal Medicine

## 2020-05-05 DIAGNOSIS — I700 Atherosclerosis of aorta: Secondary | ICD-10-CM

## 2020-05-05 MED ORDER — ROSUVASTATIN CALCIUM 20 MG PO TABS: 20.0000 mg | ORAL_TABLET | Freq: Every day | ORAL | 1 refills | Status: DC

## 2020-05-17 ENCOUNTER — Telehealth (INDEPENDENT_AMBULATORY_CARE_PROVIDER_SITE_OTHER): Payer: Self-pay | Admitting: Gastroenterology

## 2020-05-17 ENCOUNTER — Other Ambulatory Visit: Payer: Self-pay

## 2020-05-17 DIAGNOSIS — Z1211 Encounter for screening for malignant neoplasm of colon: Secondary | ICD-10-CM

## 2020-05-17 MED ORDER — NA SULFATE-K SULFATE-MG SULF 17.5-3.13-1.6 GM/177ML PO SOLN: 1.0000 | Freq: Once | ORAL | 0 refills | Status: AC

## 2020-05-17 NOTE — Progress Notes (Signed)
Gastroenterology Pre-Procedure Review  Request Date: Friday 10/08/ Requesting Physician: Dr. Allen Norris  PATIENT REVIEW QUESTIONS: The patient responded to the following health history questions as indicated:    1. Are you having any GI issues? no 2. Do you have a personal history of Polyps? no 3. Do you have a family history of Colon Cancer or Polyps? no 4. Diabetes Mellitus? no 5. Joint replacements in the past 12 months?no 6. Major health problems in the past 3 months?no 7. Any artificial heart valves, MVP, or defibrillator?no    MEDICATIONS & ALLERGIES:    Patient reports the following regarding taking any anticoagulation/antiplatelet therapy:   Plavix, Coumadin, Eliquis, Xarelto, Lovenox, Pradaxa, Brilinta, or Effient? no Aspirin? no  Patient confirms/reports the following medications:  Current Outpatient Medications  Medication Sig Dispense Refill  . Adalimumab (HUMIRA PEN) 40 MG/0.8ML PNKT Inject 40 mg into the skin every 14 (fourteen) days.    Marland Kitchen alendronate (FOSAMAX) 70 MG tablet Take 70 mg by mouth once a week.    . calcium-vitamin D 250-100 MG-UNIT tablet Take 1 tablet by mouth 2 (two) times daily.    . fluocinonide cream (LIDEX) 0.05 % Apply topically 2 (two) times daily. 30 g 0  . loratadine (CLARITIN) 10 MG tablet Take 10 mg by mouth daily.    . meloxicam (MOBIC) 15 MG tablet Take 15 mg by mouth daily.    . montelukast (SINGULAIR) 10 MG tablet TAKE 1 TABLET DAILY 90 tablet 3  . Multiple Vitamin (MULTIVITAMIN) capsule Take 1 capsule by mouth daily.    Marland Kitchen omeprazole (PRILOSEC) 20 MG capsule Take 1 capsule (20 mg total) by mouth daily. 90 capsule 3  . PROAIR HFA 108 (90 Base) MCG/ACT inhaler USE 1 INHALATION EVERY 6 HOURS AS NEEDED FOR WHEEZING OR SHORTNESS OF BREATH (NEEDS APPOINTMENT FOR FURTHER REFILLS) 25.5 g 3  . rosuvastatin (CRESTOR) 20 MG tablet Take 1 tablet (20 mg total) by mouth daily. 90 tablet 1  . tamsulosin (FLOMAX) 0.4 MG CAPS capsule Take 1 capsule (0.4 mg total)  by mouth daily. 90 capsule 11  . triamcinolone (NASACORT) 55 MCG/ACT AERO nasal inhaler Place 2 sprays into the nose daily.    . Na Sulfate-K Sulfate-Mg Sulf 17.5-3.13-1.6 GM/177ML SOLN Take 1 kit by mouth once for 1 dose. 354 mL 0   No current facility-administered medications for this visit.    Patient confirms/reports the following allergies:  Allergies  Allergen Reactions  . Pravastatin     Other reaction(s): Nausea Only  . Simvastatin     Other reaction(s): Nausea Only    Orders Placed This Encounter  Procedures  . Procedural/ Surgical Case Request: COLONOSCOPY WITH PROPOFOL    Standing Status:   Standing    Number of Occurrences:   1    Order Specific Question:   Pre-op diagnosis    Answer:   screening colonoscopy    Order Specific Question:   CPT Code    Answer:   57846    AUTHORIZATION INFORMATION Primary Insurance: 1D#: Group #:  Secondary Insurance: 1D#: Group #:  SCHEDULE INFORMATION: Date: 06/08/20 Time: Location:[facility]

## 2020-05-31 NOTE — Discharge Instructions (Signed)
General Anesthesia, Adult, Care After This sheet gives you information about how to care for yourself after your procedure. Your health care provider may also give you more specific instructions. If you have problems or questions, contact your health care provider. What can I expect after the procedure? After the procedure, the following side effects are common:  Pain or discomfort at the IV site.  Nausea.  Vomiting.  Sore throat.  Trouble concentrating.  Feeling cold or chills.  Weak or tired.  Sleepiness and fatigue.  Soreness and body aches. These side effects can affect parts of the body that were not involved in surgery. Follow these instructions at home:  For at least 24 hours after the procedure:  Have a responsible adult stay with you. It is important to have someone help care for you until you are awake and alert.  Rest as needed.  Do not: ? Participate in activities in which you could fall or become injured. ? Drive. ? Use heavy machinery. ? Drink alcohol. ? Take sleeping pills or medicines that cause drowsiness. ? Make important decisions or sign legal documents. ? Take care of children on your own. Eating and drinking  Follow any instructions from your health care provider about eating or drinking restrictions.  When you feel hungry, start by eating small amounts of foods that are soft and easy to digest (bland), such as toast. Gradually return to your regular diet.  Drink enough fluid to keep your urine pale yellow.  If you vomit, rehydrate by drinking water, juice, or clear broth. General instructions  If you have sleep apnea, surgery and certain medicines can increase your risk for breathing problems. Follow instructions from your health care provider about wearing your sleep device: ? Anytime you are sleeping, including during daytime naps. ? While taking prescription pain medicines, sleeping medicines, or medicines that make you drowsy.  Return to  your normal activities as told by your health care provider. Ask your health care provider what activities are safe for you.  Take over-the-counter and prescription medicines only as told by your health care provider.  If you smoke, do not smoke without supervision.  Keep all follow-up visits as told by your health care provider. This is important. Contact a health care provider if:  You have nausea or vomiting that does not get better with medicine.  You cannot eat or drink without vomiting.  You have pain that does not get better with medicine.  You are unable to pass urine.  You develop a skin rash.  You have a fever.  You have redness around your IV site that gets worse. Get help right away if:  You have difficulty breathing.  You have chest pain.  You have blood in your urine or stool, or you vomit blood. Summary  After the procedure, it is common to have a sore throat or nausea. It is also common to feel tired.  Have a responsible adult stay with you for the first 24 hours after general anesthesia. It is important to have someone help care for you until you are awake and alert.  When you feel hungry, start by eating small amounts of foods that are soft and easy to digest (bland), such as toast. Gradually return to your regular diet.  Drink enough fluid to keep your urine pale yellow.  Return to your normal activities as told by your health care provider. Ask your health care provider what activities are safe for you. This information is not   intended to replace advice given to you by your health care provider. Make sure you discuss any questions you have with your health care provider. Document Revised: 08/21/2017 Document Reviewed: 04/03/2017 Elsevier Patient Education  2020 Elsevier Inc.  

## 2020-06-01 ENCOUNTER — Encounter: Payer: Self-pay | Admitting: Gastroenterology

## 2020-06-01 ENCOUNTER — Other Ambulatory Visit: Payer: Self-pay

## 2020-06-05 NOTE — Progress Notes (Signed)
   06/06/2020  CC:  Chief Complaint  Patient presents with  . Cysto    HPI: Nathan Anthony is a 64 y.o. male who returns for a cystoscopy.   He was initially seen and evaluated for an abnormal rectal exam but also noted episodes of painless gross hematuria as well as microscopic hematuria. He underwent CT urogram which was suspicious for bladder tumor. 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 Surgical pathology consistent with pTadisease, primarily low-grade with 5% high-grade involvement. Muscle was present and not involved.  Cystoscopy9/2020showedan approximately 2 mm lesion adjacent to the stellate scar on the left lateral bladder wall concerning for low-grade very small recurrence. He ultimately elected to undergo office fulguration which was well-tolerated.  Cysto 09/06/19 showed a small area <1 mm at the dome of the bladder slightly posterior to the level in the midline which is slightly heaped up but no obvious papillary change.   Most recent PSA was 0.7 on 05/03/2020.   Denies hematuria. Reports issues with urination.   He has back pain.   He notes since issues and will have surgery on his nose on 07/03/2020 in [place].   His wife recently broke her hip. She is now at a rehab center.   Blood pressure (!) 145/93, pulse 71, height 5\' 11"  (1.803 m), weight 190 lb 6.4 oz (86.4 kg). NED. A&Ox3.   No respiratory distress   Abd soft, NT, ND Normal phallus with bilateral descended testicles  Cystoscopy Procedure Note  Patient identification was confirmed, informed consent was obtained, and patient was prepped using Betadine solution.  Lidocaine jelly was administered per urethral meatus.     Pre-Procedure: - Inspection reveals a normal caliber ureteral meatus.  Procedure: The flexible cystoscope was introduced without difficulty - No urethral  strictures/lesions are present. - Enlarged prostate with median lobe - Normal bladder neck - Bilateral ureteral orifices identified - Bladder mucosa  reveals no ulcers, tumors, or lesions - No bladder stones - Mild trabeculation  Retroflexion shows small median lobe cricumscribed    Post-Procedure: - Patient tolerated the procedure well  Assessment/ Plan:  1. History of malignant neoplasm of lateral wall of urinary bladder Follow up in 6 months for repeat cystoscopy Patient is agreeable  2. BPH with bladder outlet obstruction  Continue Flomax RTC in 09/2020 with IPSS/PVR, he may be interested in outlet procedure in the future but like to wait until January for personal reasons-median lobe present, may be a good candidate for holep versus TURP   I, Selena Batten, am acting as a scribe for Dr. Hollice Espy.  I have reviewed the above documentation for accuracy and completeness, and I agree with the above.   Hollice Espy, MD

## 2020-06-06 ENCOUNTER — Encounter: Payer: Self-pay | Admitting: Urology

## 2020-06-06 ENCOUNTER — Other Ambulatory Visit: Payer: Self-pay

## 2020-06-06 ENCOUNTER — Other Ambulatory Visit
Admission: RE | Admit: 2020-06-06 | Discharge: 2020-06-06 | Disposition: A | Discharge status: Home / Self Care | Source: Ambulatory Visit | Attending: Gastroenterology | Admitting: Gastroenterology

## 2020-06-06 ENCOUNTER — Ambulatory Visit (INDEPENDENT_AMBULATORY_CARE_PROVIDER_SITE_OTHER): Admitting: Urology

## 2020-06-06 VITALS — BP 145/93 | HR 71 | Ht 71.0 in | Wt 190.4 lb

## 2020-06-06 DIAGNOSIS — Z01812 Encounter for preprocedural laboratory examination: Secondary | ICD-10-CM | POA: Diagnosis present

## 2020-06-06 DIAGNOSIS — C672 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: Secondary | ICD-10-CM

## 2020-06-06 DIAGNOSIS — Z20822 Contact with and (suspected) exposure to covid-19: Secondary | ICD-10-CM | POA: Insufficient documentation

## 2020-06-06 LAB — URINALYSIS, COMPLETE
Bilirubin, UA: NEGATIVE
Glucose, UA: NEGATIVE
Ketones, UA: NEGATIVE
Leukocytes,UA: NEGATIVE
Nitrite, UA: NEGATIVE
Protein,UA: NEGATIVE
Specific Gravity, UA: 1.01 (ref 1.005–1.030)
Urobilinogen, Ur: 0.2 mg/dL (ref 0.2–1.0)
pH, UA: 7 (ref 5.0–7.5)

## 2020-06-06 LAB — MICROSCOPIC EXAMINATION
Bacteria, UA: NONE SEEN
Epithelial Cells (non renal): NONE SEEN /hpf (ref 0–10)

## 2020-06-06 LAB — SARS CORONAVIRUS 2 (TAT 6-24 HRS): SARS Coronavirus 2: NEGATIVE

## 2020-06-08 ENCOUNTER — Ambulatory Visit: Admitting: Anesthesiology

## 2020-06-08 ENCOUNTER — Ambulatory Visit
Admission: RE | Admit: 2020-06-08 | Discharge: 2020-06-08 | Disposition: A | Discharge status: Home / Self Care | Attending: Gastroenterology | Admitting: Gastroenterology

## 2020-06-08 ENCOUNTER — Encounter: Payer: Self-pay | Admitting: Gastroenterology

## 2020-06-08 ENCOUNTER — Encounter
Admission: RE | Disposition: A | Discharge status: Home / Self Care | Payer: Self-pay | Source: Home / Self Care | Attending: Gastroenterology

## 2020-06-08 ENCOUNTER — Other Ambulatory Visit: Payer: Self-pay

## 2020-06-08 DIAGNOSIS — Z888 Allergy status to other drugs, medicaments and biological substances status: Secondary | ICD-10-CM | POA: Diagnosis not present

## 2020-06-08 DIAGNOSIS — Z79899 Other long term (current) drug therapy: Secondary | ICD-10-CM | POA: Insufficient documentation

## 2020-06-08 DIAGNOSIS — Z7983 Long term (current) use of bisphosphonates: Secondary | ICD-10-CM | POA: Insufficient documentation

## 2020-06-08 DIAGNOSIS — M069 Rheumatoid arthritis, unspecified: Secondary | ICD-10-CM | POA: Insufficient documentation

## 2020-06-08 DIAGNOSIS — Z791 Long term (current) use of non-steroidal anti-inflammatories (NSAID): Secondary | ICD-10-CM | POA: Insufficient documentation

## 2020-06-08 DIAGNOSIS — K5730 Diverticulosis of large intestine without perforation or abscess without bleeding: Secondary | ICD-10-CM | POA: Insufficient documentation

## 2020-06-08 DIAGNOSIS — K648 Other hemorrhoids: Secondary | ICD-10-CM | POA: Diagnosis not present

## 2020-06-08 DIAGNOSIS — M4810 Ankylosing hyperostosis [Forestier], site unspecified: Secondary | ICD-10-CM | POA: Insufficient documentation

## 2020-06-08 DIAGNOSIS — E785 Hyperlipidemia, unspecified: Secondary | ICD-10-CM | POA: Diagnosis not present

## 2020-06-08 DIAGNOSIS — K219 Gastro-esophageal reflux disease without esophagitis: Secondary | ICD-10-CM | POA: Insufficient documentation

## 2020-06-08 DIAGNOSIS — Z1211 Encounter for screening for malignant neoplasm of colon: Secondary | ICD-10-CM

## 2020-06-08 DIAGNOSIS — J449 Chronic obstructive pulmonary disease, unspecified: Secondary | ICD-10-CM | POA: Diagnosis not present

## 2020-06-08 DIAGNOSIS — Z87891 Personal history of nicotine dependence: Secondary | ICD-10-CM | POA: Diagnosis not present

## 2020-06-08 HISTORY — PX: COLONOSCOPY WITH PROPOFOL: SHX5780

## 2020-06-08 HISTORY — DX: Malignant (primary) neoplasm, unspecified: C80.1

## 2020-06-08 HISTORY — DX: Nasal congestion: R09.81

## 2020-06-08 SURGERY — COLONOSCOPY WITH PROPOFOL
Anesthesia: General | Site: Rectum

## 2020-06-08 MED ORDER — PROPOFOL 10 MG/ML IV BOLUS
INTRAVENOUS | Status: DC | PRN
  Administered 2020-06-08: 100 mg via INTRAVENOUS
  Administered 2020-06-08: 20 mg via INTRAVENOUS

## 2020-06-08 MED ORDER — SODIUM CHLORIDE 0.9 % IV SOLN: INTRAVENOUS | Status: DC

## 2020-06-08 MED ORDER — LIDOCAINE HCL (CARDIAC) PF 100 MG/5ML IV SOSY
PREFILLED_SYRINGE | INTRAVENOUS | Status: DC | PRN
  Administered 2020-06-08: 50 mg via INTRAVENOUS

## 2020-06-08 MED ORDER — LACTATED RINGERS IV SOLN: INTRAVENOUS | Status: DC

## 2020-06-08 MED ORDER — STERILE WATER FOR IRRIGATION IR SOLN
Status: DC | PRN
  Administered 2020-06-08: .05 mL

## 2020-06-08 SURGICAL SUPPLY — 6 items
GOWN CVR UNV OPN BCK APRN NK (MISCELLANEOUS) ×2 IMPLANT
GOWN ISOL THUMB LOOP REG UNIV (MISCELLANEOUS) ×6
KIT PRC NS LF DISP ENDO (KITS) ×1 IMPLANT
KIT PROCEDURE OLYMPUS (KITS) ×3
MANIFOLD NEPTUNE II (INSTRUMENTS) ×3 IMPLANT
WATER STERILE IRR 250ML POUR (IV SOLUTION) ×3 IMPLANT

## 2020-06-08 NOTE — H&P (Signed)
Lucilla Lame, MD [facility] [address] [place], [place] [address] Phone: [phone] Fax : [phone]  Primary Care Physician:  [name], MD Primary Gastroenterologist:  Dr. Allen Norris  Pre-Procedure History & Physical: HPI:  Nathan Anthony is a 64 y.o. male is here for a screening colonoscopy.   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[place])    Cancer (HCC)    bladder   Chronic obstructive pulmonary disease ([place]) 8/67/5449   Complication of anesthesia    Asthmatic episode when awakening from colonoscopy   DISH (diffuse idiopathic skeletal hyperostosis)    neck pain   GERD (gastroesophageal reflux disease)    Hyperlipidemia    Hyperlipidemia    Inguinal hernia    Left Side   Nasal congestion    nasal polyps   Osteopenia    Osteoporosis    RA (rheumatoid arthritis) (HCC)    shoulders and hands   Rheumatoid arthritis ([place])    Sciatica    Sciatica    Sciatica    Tinnitus aurium, left     Past Surgical History:  Procedure Laterality Date   BASAL CELL CARCINOMA EXCISION     COLONOSCOPY  06/06/2010   HERNIA REPAIR     TRANSURETHRAL RESECTION OF BLADDER TUMOR WITH MITOMYCIN-C N/A 06/21/2018   Procedure: TRANSURETHRAL RESECTION OF BLADDER TUMOR WITH Gemcitabine;  Surgeon: Hollice Espy, MD;  Location: [facility] ORS;  Service: Urology;  Laterality: N/A;   VARICOCELECTOMY     VARICOCELECTOMY      Prior to Admission medications   Medication Sig Start Date End Date Taking? Authorizing Provider  Adalimumab (HUMIRA PEN) 40 MG/0.8ML PNKT Inject 40 mg into the skin every 14 (fourteen) days. 03/07/16  Yes [provider]  alendronate (FOSAMAX) 70 MG tablet Take 70 mg by mouth once a week. 10/26/17  Yes [provider]  calcium-vitamin D 250-100 MG-UNIT tablet Take 1 tablet by mouth 2 (two) times daily.   Yes [provider]  loratadine (CLARITIN) 10 MG tablet Take  10 mg by mouth daily.   Yes [provider]  meloxicam (MOBIC) 15 MG tablet Take 15 mg by mouth daily.   Yes [provider]  montelukast (SINGULAIR) 10 MG tablet TAKE 1 TABLET DAILY 01/02/20  Yes [name], MD  Multiple Vitamin (MULTIVITAMIN) capsule Take 1 capsule by mouth daily.   Yes [provider]  omeprazole (PRILOSEC) 20 MG capsule Take 1 capsule (20 mg total) by mouth daily. 05/03/20  Yes [name], MD  PROAIR HFA 108 [phone] Base) MCG/ACT inhaler USE 1 INHALATION EVERY 6 HOURS AS NEEDED FOR WHEEZING OR SHORTNESS OF BREATH (NEEDS APPOINTMENT FOR FURTHER REFILLS) 04/09/20  Yes Flora Lipps, MD  rosuvastatin (CRESTOR) 20 MG tablet Take 1 tablet (20 mg total) by mouth daily. 05/05/20  Yes [name], MD  tamsulosin (FLOMAX) 0.4 MG CAPS capsule Take 1 capsule (0.4 mg total) by mouth daily. 06/10/19  Yes Hollice Espy, MD  triamcinolone (NASACORT) 55 MCG/ACT AERO nasal inhaler Place 2 sprays into the nose daily.   Yes [provider]  fluocinonide cream (LIDEX) 0.05 % Apply topically 2 (two) times daily. 04/27/19   [name], MD  omeprazole (PRILOSEC) 20 MG capsule TAKE 1 CAPSULE DAILY 05/13/18   [name], MD    Allergies as of 05/17/2020 - Review Complete 05/03/2020  Allergen Reaction Noted   Pravastatin  11/15/2014  Simvastatin  11/15/2014    Family History  Problem Relation Age of Onset   Heart disease Mother    Asthma Mother    Heart disease Brother    Ankylosing spondylitis Brother     Social History   Socioeconomic History   Marital status: Divorced    Spouse name: Not on file   Number of children: Not on file   Years of education: Not on file   Highest education level: Not on file  Occupational History   Not on file  Tobacco Use   Smoking status: Former Smoker    Years: 5.00    Types: Pipe   Smokeless tobacco: Never Used  Scientific laboratory technician Use: Never used  Substance and Sexual  Activity   Alcohol use: Yes    Comment: occasionally   Drug use: No   Sexual activity: Yes    Birth control/protection: None  Other Topics Concern   Not on file  Social History Narrative   Not on file   Social Determinants of Health   Financial Resource Strain:    Difficulty of Paying Living Expenses: Not on file  Food Insecurity:    Worried About Charity fundraiser in the Last Year: Not on file   [place] in the Last Year: Not on file  Transportation Needs:    Lack of Transportation (Medical): Not on file   Lack of Transportation (Non-Medical): Not on file  Physical Activity:    Days of Exercise per Week: Not on file   Minutes of Exercise per Session: Not on file  Stress:    Feeling of Stress : Not on file  Social Connections:    Frequency of Communication with Friends and Family: Not on file   Frequency of Social Gatherings with Friends and Family: Not on file   Attends Religious Services: Not on file   Active Member of Clubs or Organizations: Not on file   Attends Archivist Meetings: Not on file   Marital Status: Not on file  Intimate Partner Violence:    Fear of Current or Ex-Partner: Not on file   Emotionally Abused: Not on file   Physically Abused: Not on file   Sexually Abused: Not on file    Review of Systems: See HPI, otherwise negative ROS  Physical Exam: BP (!) 136/91    Pulse 70    Temp 97.9 F (36.6 C) (Temporal)    Ht 5\' 11"  (1.803 m)    Wt 82.6 kg    SpO2 98%    BMI 25.38 kg/m  General:   Alert,  pleasant and cooperative in NAD Head:  Normocephalic and atraumatic. Neck:  Supple; no masses or thyromegaly. Lungs:  Clear throughout to auscultation.    Heart:  Regular rate and rhythm. Abdomen:  Soft, nontender and nondistended. Normal bowel sounds, without guarding, and without rebound.   Neurologic:  Alert and  oriented x4;  grossly normal neurologically.  Impression/Plan: Nathan Anthony is now here to  undergo a screening colonoscopy.  Risks, benefits, and alternatives regarding colonoscopy have been reviewed with the patient.  Questions have been answered.  All parties agreeable.

## 2020-06-08 NOTE — Anesthesia Postprocedure Evaluation (Signed)
Anesthesia Post Note  Patient: Nathan Anthony  Procedure(s) Performed: COLONOSCOPY WITH PROPOFOL (N/A Rectum)     Patient location during evaluation: PACU Anesthesia Type: General Level of consciousness: awake and alert Pain management: pain level controlled Vital Signs Assessment: post-procedure vital signs reviewed and stable Respiratory status: spontaneous breathing, nonlabored ventilation, respiratory function stable and patient connected to nasal cannula oxygen Cardiovascular status: blood pressure returned to baseline and stable Postop Assessment: no apparent nausea or vomiting Anesthetic complications: no   No complications documented.  Adele Barthel Valda Christenson

## 2020-06-08 NOTE — Anesthesia Preprocedure Evaluation (Signed)
Anesthesia Evaluation  Patient identified by MRN, date of birth, ID band Patient awake    History of Anesthesia Complications Negative for: history of anesthetic complications  Airway Mallampati: I  TM Distance: >3 FB Neck ROM: Full    Dental no notable dental hx.    Pulmonary asthma , former smoker,    Pulmonary exam normal        Cardiovascular Exercise Tolerance: Good negative cardio ROS Normal cardiovascular exam     Neuro/Psych negative neurological ROS     GI/Hepatic Neg liver ROS, GERD  Medicated,  Endo/Other  negative endocrine ROS  Renal/GU negative Renal ROS     Musculoskeletal  (+) Arthritis  (rheumatoid arthritis, on Humira. ), DISH (diffuse idiopathic skeletal hyperostosis)   Abdominal   Peds  Hematology negative hematology ROS (+)   Anesthesia Other Findings   Reproductive/Obstetrics                             Anesthesia Physical Anesthesia Plan  ASA: III  Anesthesia Plan: General   Post-op Pain Management:    Induction: Intravenous  PONV Risk Score and Plan: 2 and Propofol infusion, TIVA and Treatment may vary due to age or medical condition  Airway Management Planned: Nasal Cannula and Natural Airway  Additional Equipment: None  Intra-op Plan:   Post-operative Plan:   Informed Consent: I have reviewed the patients History and Physical, chart, labs and discussed the procedure including the risks, benefits and alternatives for the proposed anesthesia with the patient or authorized representative who has indicated his/her understanding and acceptance.       Plan Discussed with: CRNA  Anesthesia Plan Comments:         Anesthesia Quick Evaluation

## 2020-06-08 NOTE — Anesthesia Procedure Notes (Signed)
Procedure Name: General with mask airway Date/Time: 06/08/2020 10:06 AM Performed by: Jeannene Patella, CRNA Pre-anesthesia Checklist: Timeout performed, Patient being monitored, Suction available, Emergency Drugs available and Patient identified Patient Re-evaluated:Patient Re-evaluated prior to induction Oxygen Delivery Method: Nasal cannula

## 2020-06-08 NOTE — Transfer of Care (Signed)
Immediate Anesthesia Transfer of Care Note  Patient: Nathan Anthony  Procedure(s) Performed: COLONOSCOPY WITH PROPOFOL (N/A Rectum)  Patient Location: PACU  Anesthesia Type: General  Level of Consciousness: awake, alert  and patient cooperative  Airway and Oxygen Therapy: Patient Spontanous Breathing and Patient connected to supplemental oxygen  Post-op Assessment: Post-op Vital signs reviewed, Patient's Cardiovascular Status Stable, Respiratory Function Stable, Patent Airway and No signs of Nausea or vomiting  Post-op Vital Signs: Reviewed and stable  Complications: No complications documented.

## 2020-06-08 NOTE — Op Note (Addendum)
[facility] Gastroenterology Patient Name: Nathan Anthony Procedure Date: 06/08/2020 9:56 AM MRN: [id number] Account #: [id number] Date of Birth: August 08, 1956 Admit Type: Outpatient Age: 64 Room: [facility] OR ROOM 01 Gender: Male Note Status: Supervisor Override Procedure:             Colonoscopy Indications:           Screening for colorectal malignant neoplasm Providers:             Lucilla Lame MD, MD Referring MD:          Halina Maidens, MD (Referring MD) Medicines:             Propofol per Anesthesia Complications:         No immediate complications. Procedure:             Pre-Anesthesia Assessment:                        - Prior to the procedure, a History and Physical was                         performed, and patient medications and allergies were                         reviewed. The patient's tolerance of previous                         anesthesia was also reviewed. The risks and benefits                         of the procedure and the sedation options and risks                         were discussed with the patient. All questions were                         answered, and informed consent was obtained. Prior                         Anticoagulants: The patient has taken no previous                         anticoagulant or antiplatelet agents. ASA Grade                         Assessment: II - A patient with mild systemic disease.                         After reviewing the risks and benefits, the patient                         was deemed in satisfactory condition to undergo the                         procedure.                        After obtaining informed consent, the colonoscope was  passed under direct vision. Throughout the procedure,                         the patient's blood pressure, pulse, and oxygen                         saturations were monitored continuously. The was                         introduced through the anus  and advanced to the the                         cecum, identified by appendiceal orifice and ileocecal                         valve. The colonoscopy was performed without                         difficulty. The patient tolerated the procedure well.                         The quality of the bowel preparation was excellent. Findings:      The perianal and digital rectal examinations were normal.      Many small-mouthed diverticula were found in the entire colon.      Non-bleeding internal hemorrhoids were found during retroflexion. The       hemorrhoids were Grade I (internal hemorrhoids that do not prolapse). Impression:            - Diverticulosis in the entire examined colon.                        - Non-bleeding internal hemorrhoids.                        - No specimens collected. Recommendation:        - Discharge patient to home.                        - Resume previous diet.                        - Continue present medications.                        - Repeat colonoscopy in 10 years for screening                         purposes.                        - unless any change in family history or lower GI                         problems. Procedure Code(s):     --- Professional ---                        [phone], Colonoscopy, flexible; diagnostic, including                         collection of  specimen(s) by brushing or washing, when                         performed (separate procedure) Diagnosis Code(s):     --- Professional ---                        Z12.11, Encounter for screening for malignant neoplasm                         of colon CPT copyright 2019 [employer]. All rights reserved. The codes documented in this report are preliminary and upon coder review may  be revised to meet current compliance requirements. Lucilla Lame MD, MD 06/08/2020 10:18:02 AM This report has been signed electronically. Number of Addenda: 0 Note Initiated On: 06/08/2020 9:56  AM Scope Withdrawal Time: 0 hours 6 minutes 42 seconds  Total Procedure Duration: 0 hours 10 minutes 10 seconds  Estimated Blood Loss:  Estimated blood loss: none.      [facility]

## 2020-06-11 ENCOUNTER — Encounter: Payer: Self-pay | Admitting: Gastroenterology

## 2020-06-27 ENCOUNTER — Other Ambulatory Visit: Payer: Self-pay

## 2020-06-27 ENCOUNTER — Encounter: Payer: Self-pay | Admitting: Otolaryngology

## 2020-06-29 ENCOUNTER — Other Ambulatory Visit: Payer: Self-pay

## 2020-06-29 ENCOUNTER — Other Ambulatory Visit
Admission: RE | Admit: 2020-06-29 | Discharge: 2020-06-29 | Disposition: A | Discharge status: Home / Self Care | Source: Ambulatory Visit | Attending: Otolaryngology | Admitting: Otolaryngology

## 2020-06-29 DIAGNOSIS — Z01812 Encounter for preprocedural laboratory examination: Secondary | ICD-10-CM | POA: Diagnosis present

## 2020-06-29 DIAGNOSIS — Z20822 Contact with and (suspected) exposure to covid-19: Secondary | ICD-10-CM | POA: Diagnosis not present

## 2020-06-29 NOTE — Discharge Instructions (Signed)
[facility] ENDOSCOPIC SINUS SURGERY [facility]  What is Functional Endoscopic Sinus Surgery?  The Surgery involves making the natural openings of the sinuses larger by removing the bony partitions that separate the sinuses from the nasal cavity.  The natural sinus lining is preserved as much as possible to allow the sinuses to resume normal function after the surgery.  In some patients nasal polyps (excessively swollen lining of the sinuses) may be removed to relieve obstruction of the sinus openings.  The surgery is performed through the nose using lighted scopes, which eliminates the need for incisions on the face.  A septoplasty is a different procedure which is sometimes performed with sinus surgery.  It involves straightening the boy partition that separates the two sides of your nose.  A crooked or deviated septum may need repair if is obstructing the sinuses or nasal airflow.  Turbinate reduction is also often performed during sinus surgery.  The turbinates are bony proturberances from the side walls of the nose which swell and can obstruct the nose in patients with sinus and allergy problems.  Their size can be surgically reduced to help relieve nasal obstruction.  What Can Sinus Surgery Do For Me?  Sinus surgery can reduce the frequency of sinus infections requiring antibiotic treatment.  This can provide improvement in nasal congestion, post-nasal drainage, facial pressure and nasal obstruction.  Surgery will NOT prevent you from ever having an infection again, so it usually only for patients who get infections 4 or more times yearly requiring antibiotics, or for infections that do not clear with antibiotics.  It will not cure nasal allergies, so patients with allergies may still require medication to treat their allergies after surgery. Surgery may improve headaches related to sinusitis, however, some people will continue to  require medication to control sinus headaches related to allergies.  Surgery will do nothing for other forms of headache (migraine, tension or cluster).  What Are the Risks of Endoscopic Sinus Surgery?  Current techniques allow surgery to be performed safely with little risk, however, there are rare complications that patients should be aware of.  Because the sinuses are located around the eyes, there is risk of eye injury, including blindness, though again, this would be quite rare. This is usually a result of bleeding behind the eye during surgery, which puts the vision oat risk, though there are treatments to protect the vision and prevent permanent disrupted by surgery causing a leak of the spinal fluid that surrounds the brain.  More serious complications would include bleeding inside the brain cavity or damage to the brain.  Again, all of these complications are uncommon, and spinal fluid leaks can be safely managed surgically if they occur.  The most common complication of sinus surgery is bleeding from the nose, which may require packing or cauterization of the nose.  Continued sinus have polyps may experience recurrence of the polyps requiring revision surgery.  Alterations of sense of smell or injury to the tear ducts are also rare complications.   What is the Surgery Like, and what is the Recovery?  The Surgery usually takes a couple of hours to perform, and is usually performed under a general anesthetic (completely asleep).  Patients are usually discharged home after a couple of hours.  Sometimes during surgery it is necessary to pack the nose to control bleeding, and the packing is left in place for 24 - 48 hours, and removed by your surgeon.    If a septoplasty was performed during the procedure, there is often a splint placed which must be removed after 5-7 days.   Discomfort: Pain is usually mild to moderate, and can be controlled by prescription pain medication or acetaminophen (Tylenol).   Aspirin, Ibuprofen (Advil, Motrin), or Naprosyn (Aleve) should be avoided, as they can cause increased bleeding.  Most patients feel sinus pressure like they have a bad head cold for several days.  Sleeping with your head elevated can help reduce swelling and facial pressure, as can ice packs over the face.  A humidifier may be helpful to keep the mucous and blood from drying in the nose.   Diet: There are no specific diet restrictions, however, you should generally start with clear liquids and a light diet of bland foods because the anesthetic can cause some nausea.  Advance your diet depending on how your stomach feels.  Taking your pain medication with food will often help reduce stomach upset which pain medications can cause.  Nasal Saline Irrigation: It is important to remove blood clots and dried mucous from the nose as it is healing.  This is done by having you irrigate the nose at least 3 - 4 times daily with a salt water solution.  We recommend using NeilMed Sinus Rinse (available at the drug store).  Fill the squeeze bottle with the solution, bend over a sink, and insert the tip of the squeeze bottle into the nose  of an inch.  Point the tip of the squeeze bottle towards the inside corner of the eye on the same side your irrigating.  Squeeze the bottle and gently irrigate the nose.  If you bend forward as you do this, most of the fluid will flow back out of the nose, instead of down your throat.   The solution should be warm, near body temperature, when you irrigate.   Each time you irrigate, you should use a full squeeze bottle.   Note that if you are instructed to use Nasal Steroid Sprays at any time after your surgery, irrigate with saline BEFORE using the steroid spray, so you do not wash it all out of the nose. Another product, Nasal Saline Gel (such as AYR Nasal Saline Gel) can be applied in each nostril 3 - 4 times daily to moisture the nose and reduce scabbing or crusting.  Bleeding:   Bloody drainage from the nose can be expected for several days, and patients are instructed to irrigate their nose frequently with salt water to help remove mucous and blood clots.  The drainage may be dark red or brown, though some fresh blood may be seen intermittently, especially after irrigation.  Do not blow you nose, as bleeding may occur. If you must sneeze, keep your mouth open to allow air to escape through your mouth.  If heavy bleeding occurs: Irrigate the nose with saline to rinse out clots, then spray the nose 3 - 4 times with Afrin Nasal Decongestant Spray.  The spray will constrict the blood vessels to slow bleeding.  Pinch the lower half of your nose shut to apply pressure, and lay down with your head elevated.  Ice packs over the nose may help as well. If bleeding persists despite these measures, you should notify your doctor.  Do not use the Afrin routinely to control nasal congestion after surgery, as it can result in worsening congestion and may affect healing.     Activity: Return to work varies among patients. Most patients will be   out of work at least 5 - 7 days to recover.  Patient may return to work after they are off of narcotic pain medication, and feeling well enough to perform the functions of their job.  Patients must avoid heavy lifting (over 10 pounds) or strenuous physical for 2 weeks after surgery, so your employer may need to assign you to light duty, or keep you out of work longer if light duty is not possible.  NOTE: you should not drive, operate dangerous machinery, do any mentally demanding tasks or make any important legal or financial decisions while on narcotic pain medication and recovering from the general anesthetic.    Call Your Doctor Immediately if You Have Any of the Following: 1. Bleeding that you cannot control with the above measures 2. Loss of vision, double vision, bulging of the eye or black eyes. 3. Fever over 101 degrees 4. Neck stiffness with  severe headache, fever, nausea and change in mental state. You are always encourage to call anytime with concerns, however, please call with requests for pain medication refills during office hours.  Office Endoscopy: During follow-up visits your doctor will remove any packing or splints that may have been placed and evaluate and clean your sinuses endoscopically.  Topical anesthetic will be used to make this as comfortable as possible, though you may want to take your pain medication prior to the visit.  How often this will need to be done varies from patient to patient.  After complete recovery from the surgery, you may need follow-up endoscopy from time to time, particularly if there is concern of recurrent infection or nasal polyps.  General Anesthesia, Adult, Care After This sheet gives you information about how to care for yourself after your procedure. Your health care provider may also give you more specific instructions. If you have problems or questions, contact your health care provider. What can I expect after the procedure? After the procedure, the following side effects are common:  Pain or discomfort at the IV site.  Nausea.  Vomiting.  Sore throat.  Trouble concentrating.  Feeling cold or chills.  Weak or tired.  Sleepiness and fatigue.  Soreness and body aches. These side effects can affect parts of the body that were not involved in surgery. Follow these instructions at home:  For at least 24 hours after the procedure:  Have a responsible adult stay with you. It is important to have someone help care for you until you are awake and alert.  Rest as needed.  Do not: ? Participate in activities in which you could fall or become injured. ? Drive. ? Use heavy machinery. ? Drink alcohol. ? Take sleeping pills or medicines that cause drowsiness. ? Make important decisions or sign legal documents. ? Take care of children on your own. Eating and drinking  Follow  any instructions from your health care provider about eating or drinking restrictions.  When you feel hungry, start by eating small amounts of foods that are soft and easy to digest (bland), such as toast. Gradually return to your regular diet.  Drink enough fluid to keep your urine pale yellow.  If you vomit, rehydrate by drinking water, juice, or clear broth. General instructions  If you have sleep apnea, surgery and certain medicines can increase your risk for breathing problems. Follow instructions from your health care provider about wearing your sleep device: ? Anytime you are sleeping, including during daytime naps. ? While taking prescription pain medicines, sleeping medicines, or medicines that   make you drowsy.  Return to your normal activities as told by your health care provider. Ask your health care provider what activities are safe for you.  Take over-the-counter and prescription medicines only as told by your health care provider.  If you smoke, do not smoke without supervision.  Keep all follow-up visits as told by your health care provider. This is important. Contact a health care provider if:  You have nausea or vomiting that does not get better with medicine.  You cannot eat or drink without vomiting.  You have pain that does not get better with medicine.  You are unable to pass urine.  You develop a skin rash.  You have a fever.  You have redness around your IV site that gets worse. Get help right away if:  You have difficulty breathing.  You have chest pain.  You have blood in your urine or stool, or you vomit blood. Summary  After the procedure, it is common to have a sore throat or nausea. It is also common to feel tired.  Have a responsible adult stay with you for the first 24 hours after general anesthesia. It is important to have someone help care for you until you are awake and alert.  When you feel hungry, start by eating small amounts of  foods that are soft and easy to digest (bland), such as toast. Gradually return to your regular diet.  Drink enough fluid to keep your urine pale yellow.  Return to your normal activities as told by your health care provider. Ask your health care provider what activities are safe for you. This information is not intended to replace advice given to you by your health care provider. Make sure you discuss any questions you have with your health care provider. Document Revised: 08/21/2017 Document Reviewed: 04/03/2017 Elsevier Patient Education  2020 [employer]  

## 2020-06-30 LAB — SARS CORONAVIRUS 2 (TAT 6-24 HRS): SARS Coronavirus 2: NEGATIVE

## 2020-07-03 ENCOUNTER — Ambulatory Visit
Admission: RE | Admit: 2020-07-03 | Discharge: 2020-07-03 | Disposition: A | Discharge status: Home / Self Care | Attending: Otolaryngology | Admitting: Otolaryngology

## 2020-07-03 ENCOUNTER — Other Ambulatory Visit: Payer: Self-pay

## 2020-07-03 ENCOUNTER — Ambulatory Visit: Admitting: Anesthesiology

## 2020-07-03 ENCOUNTER — Encounter: Payer: Self-pay | Admitting: Otolaryngology

## 2020-07-03 ENCOUNTER — Encounter
Admission: RE | Disposition: A | Discharge status: Home / Self Care | Payer: Self-pay | Source: Home / Self Care | Attending: Otolaryngology

## 2020-07-03 DIAGNOSIS — J342 Deviated nasal septum: Secondary | ICD-10-CM

## 2020-07-03 DIAGNOSIS — J449 Chronic obstructive pulmonary disease, unspecified: Secondary | ICD-10-CM | POA: Diagnosis not present

## 2020-07-03 DIAGNOSIS — Z7951 Long term (current) use of inhaled steroids: Secondary | ICD-10-CM | POA: Diagnosis not present

## 2020-07-03 DIAGNOSIS — J343 Hypertrophy of nasal turbinates: Secondary | ICD-10-CM | POA: Diagnosis not present

## 2020-07-03 DIAGNOSIS — Z83511 Family history of glaucoma: Secondary | ICD-10-CM | POA: Insufficient documentation

## 2020-07-03 DIAGNOSIS — J339 Nasal polyp, unspecified: Secondary | ICD-10-CM | POA: Diagnosis not present

## 2020-07-03 DIAGNOSIS — Z79899 Other long term (current) drug therapy: Secondary | ICD-10-CM | POA: Diagnosis not present

## 2020-07-03 DIAGNOSIS — J338 Other polyp of sinus: Secondary | ICD-10-CM | POA: Diagnosis not present

## 2020-07-03 DIAGNOSIS — Z888 Allergy status to other drugs, medicaments and biological substances status: Secondary | ICD-10-CM | POA: Diagnosis not present

## 2020-07-03 DIAGNOSIS — Z87891 Personal history of nicotine dependence: Secondary | ICD-10-CM | POA: Diagnosis not present

## 2020-07-03 DIAGNOSIS — J329 Chronic sinusitis, unspecified: Secondary | ICD-10-CM | POA: Insufficient documentation

## 2020-07-03 DIAGNOSIS — Z825 Family history of asthma and other chronic lower respiratory diseases: Secondary | ICD-10-CM | POA: Insufficient documentation

## 2020-07-03 DIAGNOSIS — K219 Gastro-esophageal reflux disease without esophagitis: Secondary | ICD-10-CM | POA: Insufficient documentation

## 2020-07-03 DIAGNOSIS — Z8249 Family history of ischemic heart disease and other diseases of the circulatory system: Secondary | ICD-10-CM | POA: Insufficient documentation

## 2020-07-03 HISTORY — PX: SPHENOIDECTOMY: SHX2421

## 2020-07-03 HISTORY — PX: ETHMOIDECTOMY: SHX5197

## 2020-07-03 HISTORY — PX: MAXILLARY ANTROSTOMY: SHX2003

## 2020-07-03 HISTORY — PX: IMAGE GUIDED SINUS SURGERY: SHX6570

## 2020-07-03 HISTORY — PX: NASAL SEPTOPLASTY W/ TURBINOPLASTY: SHX2070

## 2020-07-03 HISTORY — PX: FRONTAL SINUS EXPLORATION: SHX6591

## 2020-07-03 SURGERY — SINUS SURGERY, WITH IMAGING GUIDANCE
Anesthesia: General | Site: Nose

## 2020-07-03 MED ORDER — HYDROCODONE-ACETAMINOPHEN 5-325 MG PO TABS
1.0000 | ORAL_TABLET | Freq: Four times a day (QID) | ORAL | 0 refills | Status: DC | PRN
Start: 2020-07-03 — End: 2020-09-04

## 2020-07-03 MED ORDER — ONDANSETRON HCL 4 MG/2ML IJ SOLN: 4.0000 mg | Freq: Once | INTRAMUSCULAR | Status: DC | PRN

## 2020-07-03 MED ORDER — CEFDINIR 300 MG PO CAPS: 300.0000 mg | ORAL_CAPSULE | Freq: Two times a day (BID) | ORAL | 0 refills | Status: DC

## 2020-07-03 MED ORDER — PROPOFOL 10 MG/ML IV BOLUS
INTRAVENOUS | Status: DC | PRN
  Administered 2020-07-03: 50 mg via INTRAVENOUS
  Administered 2020-07-03: 150 mg via INTRAVENOUS

## 2020-07-03 MED ORDER — OXYCODONE HCL 5 MG PO TABS: 5.0000 mg | ORAL_TABLET | Freq: Once | ORAL | Status: AC | PRN

## 2020-07-03 MED ORDER — EPHEDRINE SULFATE 50 MG/ML IJ SOLN
INTRAMUSCULAR | Status: DC | PRN
  Administered 2020-07-03 (×3): 10 mg via INTRAVENOUS

## 2020-07-03 MED ORDER — SUCCINYLCHOLINE CHLORIDE 20 MG/ML IJ SOLN
INTRAMUSCULAR | Status: DC | PRN
  Administered 2020-07-03: 100 mg via INTRAVENOUS

## 2020-07-03 MED ORDER — DEXMEDETOMIDINE HCL 200 MCG/2ML IV SOLN
INTRAVENOUS | Status: DC | PRN
  Administered 2020-07-03 (×3): 5 ug via INTRAVENOUS

## 2020-07-03 MED ORDER — LACTATED RINGERS IV SOLN: INTRAVENOUS | Status: DC

## 2020-07-03 MED ORDER — ONDANSETRON HCL 4 MG/2ML IJ SOLN
INTRAMUSCULAR | Status: DC | PRN
  Administered 2020-07-03: 4 mg via INTRAVENOUS

## 2020-07-03 MED ORDER — DEXAMETHASONE SODIUM PHOSPHATE 4 MG/ML IJ SOLN
INTRAMUSCULAR | Status: DC | PRN
  Administered 2020-07-03: 4 mg via INTRAVENOUS

## 2020-07-03 MED ORDER — LIDOCAINE-EPINEPHRINE 1 %-1:100000 IJ SOLN
INTRAMUSCULAR | Status: DC | PRN
  Administered 2020-07-03: 7 mL
  Administered 2020-07-03: 10 mL

## 2020-07-03 MED ORDER — MIDAZOLAM HCL 5 MG/5ML IJ SOLN
INTRAMUSCULAR | Status: DC | PRN
  Administered 2020-07-03: 2 mg via INTRAVENOUS

## 2020-07-03 MED ORDER — ACETAMINOPHEN 10 MG/ML IV SOLN
1000.0000 mg | Freq: Once | INTRAVENOUS | Status: AC
  Administered 2020-07-03: 1000 mg via INTRAVENOUS

## 2020-07-03 MED ORDER — GLYCOPYRROLATE 0.2 MG/ML IJ SOLN
INTRAMUSCULAR | Status: DC | PRN
  Administered 2020-07-03: .1 mg via INTRAVENOUS

## 2020-07-03 MED ORDER — FENTANYL CITRATE (PF) 100 MCG/2ML IJ SOLN
INTRAMUSCULAR | Status: DC | PRN
  Administered 2020-07-03 (×2): 50 ug via INTRAVENOUS

## 2020-07-03 MED ORDER — OXYMETAZOLINE HCL 0.05 % NA SOLN
NASAL | Status: DC | PRN
  Administered 2020-07-03: 1 via TOPICAL

## 2020-07-03 MED ORDER — OXYCODONE HCL 5 MG/5ML PO SOLN
5.0000 mg | Freq: Once | ORAL | Status: AC | PRN
  Administered 2020-07-03: 5 mg via ORAL

## 2020-07-03 MED ORDER — LIDOCAINE HCL (CARDIAC) PF 100 MG/5ML IV SOSY
PREFILLED_SYRINGE | INTRAVENOUS | Status: DC | PRN
  Administered 2020-07-03: 80 mg via INTRAVENOUS

## 2020-07-03 MED ORDER — PREDNISONE 10 MG (21) PO TBPK: ORAL_TABLET | ORAL | 0 refills | Status: DC

## 2020-07-03 MED ORDER — FENTANYL CITRATE (PF) 100 MCG/2ML IJ SOLN: 25.0000 ug | INTRAMUSCULAR | Status: DC | PRN

## 2020-07-03 SURGICAL SUPPLY — 28 items
COAG SUCT 10F 3.5MM HAND CTRL (MISCELLANEOUS) ×4 IMPLANT
DRESSING NASL FOAM PST OP SINU (MISCELLANEOUS) ×2 IMPLANT
DRSG NASAL FOAM POST OP SINU (MISCELLANEOUS) ×4
ELECT REM PT RETURN 9FT ADLT (ELECTROSURGICAL) ×4
ELECTRODE REM PT RTRN 9FT ADLT (ELECTROSURGICAL) ×2 IMPLANT
GLOVE BIO SURGEON STRL SZ7.5 (GLOVE) ×12 IMPLANT
GOWN STRL REUS W/ TWL LRG LVL3 (GOWN DISPOSABLE) ×2 IMPLANT
GOWN STRL REUS W/TWL LRG LVL3 (GOWN DISPOSABLE) ×4
IV NS 500ML (IV SOLUTION) ×4
IV NS 500ML BAXH (IV SOLUTION) ×2 IMPLANT
KIT TURNOVER KIT A (KITS) ×4 IMPLANT
NS IRRIG 500ML POUR BTL (IV SOLUTION) ×4 IMPLANT
PACK ENT CUSTOM (PACKS) ×4 IMPLANT
PACKING NASAL EPIS 4X2.4 XEROG (MISCELLANEOUS) ×8 IMPLANT
PATTIES SURGICAL .5 X3 (DISPOSABLE) ×4 IMPLANT
SHAVER DIEGO BLD STD TYPE A (BLADE) ×4 IMPLANT
SOL ANTI-FOG 6CC FOG-OUT (MISCELLANEOUS) ×2 IMPLANT
SOL FOG-OUT ANTI-FOG 6CC (MISCELLANEOUS) ×2
SPLINT NASAL SEPTAL PRE-CUT (MISCELLANEOUS) ×4 IMPLANT
SUT CHROMIC 4 0 RB 1X27 (SUTURE) ×4 IMPLANT
SUT ETHILON 3-0 KS 30 BLK (SUTURE) IMPLANT
SUT ETHILON 4-0 (SUTURE) ×4
SUT ETHILON 4-0 FS2 18XMFL BLK (SUTURE) ×2
SUT PLAIN GUT 4-0 (SUTURE) ×4 IMPLANT
SUTURE ETHLN 4-0 FS2 18XMF BLK (SUTURE) ×2 IMPLANT
TOWEL OR 17X26 4PK STRL BLUE (TOWEL DISPOSABLE) ×4 IMPLANT
TUBING DECLOG MULTIDEBRIDER (TUBING) ×4 IMPLANT
WATER STERILE IRR 250ML POUR (IV SOLUTION) ×4 IMPLANT

## 2020-07-03 NOTE — Transfer of Care (Signed)
Immediate Anesthesia Transfer of Care Note  Patient: Nathan Anthony  Procedure(s) Performed: IMAGE GUIDED SINUS SURGERY (N/A Nose) NASAL SEPTOPLASTY (Bilateral Nose) FRONTAL SINUS EXPLORATION (Bilateral Nose) ETHMOIDECTOMY (Bilateral Nose) SPHENOIDECTOMY (Bilateral Nose) MAXILLARY ANTROSTOMY with tissue removal (Bilateral Nose)  Patient Location: PACU  Anesthesia Type: General ETT  Level of Consciousness: awake, alert  and patient cooperative  Airway and Oxygen Therapy: Patient Spontanous Breathing and Patient connected to supplemental oxygen  Post-op Assessment: Post-op Vital signs reviewed, Patient's Cardiovascular Status Stable, Respiratory Function Stable, Patent Airway and No signs of Nausea or vomiting  Post-op Vital Signs: Reviewed and stable  Complications: No complications documented.

## 2020-07-03 NOTE — Anesthesia Procedure Notes (Signed)
Procedure Name: Intubation Date/Time: 07/03/2020 8:40 AM Performed by: Jeannene Patella, CRNA Pre-anesthesia Checklist: Patient identified, Emergency Drugs available, Patient being monitored, Suction available and Timeout performed Patient Re-evaluated:Patient Re-evaluated prior to induction Oxygen Delivery Method: Circle system utilized Preoxygenation: Pre-oxygenation with 100% oxygen Induction Type: IV induction Ventilation: Mask ventilation without difficulty Laryngoscope Size: Miller, 2, Mac and 4 Grade View: Grade III Tube type: Oral Tube size: 7.5 mm Number of attempts: 2 Airway Equipment and Method: Bougie stylet Placement Confirmation: ETT inserted through vocal cords under direct vision,  positive ETCO2 and breath sounds checked- equal and bilateral Secured at: 24 cm Tube secured with: Tape Dental Injury: Teeth and Oropharynx as per pre-operative assessment  Comments: DL x 1with Miller 2. Grade IV view. MAC 4 with Grade III view. Easy mask ventilation, positive etCO2, VSS.

## 2020-07-03 NOTE — Anesthesia Postprocedure Evaluation (Signed)
Anesthesia Post Note  Patient: Nathan Anthony  Procedure(s) Performed: IMAGE GUIDED SINUS SURGERY (N/A Nose) NASAL SEPTOPLASTY (Bilateral Nose) FRONTAL SINUS EXPLORATION (Bilateral Nose) ETHMOIDECTOMY (Bilateral Nose) SPHENOIDECTOMY (Bilateral Nose) MAXILLARY ANTROSTOMY with tissue removal (Bilateral Nose)     Patient location during evaluation: PACU Anesthesia Type: General Level of consciousness: awake and alert and oriented Pain management: satisfactory to patient Vital Signs Assessment: post-procedure vital signs reviewed and stable Respiratory status: spontaneous breathing, nonlabored ventilation and respiratory function stable Cardiovascular status: blood pressure returned to baseline and stable Postop Assessment: Adequate PO intake and No signs of nausea or vomiting Anesthetic complications: no   No complications documented.  Raliegh Ip

## 2020-07-03 NOTE — Anesthesia Preprocedure Evaluation (Signed)
Anesthesia Evaluation  Patient identified by MRN, date of birth, ID band Patient awake    Reviewed: Allergy & Precautions, H&P , NPO status , Patient's Chart, lab work & pertinent test results  Airway Mallampati: II  TM Distance: >3 FB Neck ROM: full    Dental no notable dental hx.    Pulmonary asthma , COPD, former smoker,    Pulmonary exam normal breath sounds clear to auscultation       Cardiovascular Normal cardiovascular exam Rhythm:regular Rate:Normal     Neuro/Psych    GI/Hepatic GERD  ,  Endo/Other    Renal/GU      Musculoskeletal   Abdominal   Peds  Hematology   Anesthesia Other Findings   Reproductive/Obstetrics                             Anesthesia Physical Anesthesia Plan  ASA: III  Anesthesia Plan: General ETT   Post-op Pain Management:    Induction:   PONV Risk Score and Plan: 2 and Treatment may vary due to age or medical condition, Ondansetron and Dexamethasone  Airway Management Planned:   Additional Equipment:   Intra-op Plan:   Post-operative Plan:   Informed Consent: I have reviewed the patients History and Physical, chart, labs and discussed the procedure including the risks, benefits and alternatives for the proposed anesthesia with the patient or authorized representative who has indicated his/her understanding and acceptance.     Dental Advisory Given  Plan Discussed with: CRNA  Anesthesia Plan Comments:         Anesthesia Quick Evaluation

## 2020-07-03 NOTE — H&P (Signed)
History and physical reviewed and will be scanned in later. No change in medical status reported by the patient or family, appears stable for surgery. All questions regarding the procedure answered, and patient (or family if a child) expressed understanding of the procedure. ? ?Nathan Anthony ?@TODAY@ ?

## 2020-07-03 NOTE — Op Note (Signed)
07/03/2020  11:40 AM    Nathan Anthony  [id number]   Pre-Op Diagnosis:  deviated septum, chronic sinusitis, nasal polyposis  Post-op Diagnosis: deviated septum, chronic sinusitis, nasal polyposis  Procedure:  1)  Image Guided Sinus Surgery,   2)  Bilateral Endoscopic Maxillary Antrostomy with Tissue Removal   3)  Bilateral Frontal Sinusotomy with tissue removal   4)  Bilateral Total Ethmoidectomy   5)  Bilateral Sphenoidotomy with tissue removal   6)  Nasal Septoplasty    Surgeon:  Riley Nearing  Anesthesia:  General endotracheal  EBL:  740 cc  Complications:  None  Findings: right septal deviation with left septal spur, bilateral nasal polyps occluding the maxillary, ethmoid and frontal sinuses, bilateral concha bullosae  Procedure: After the patient was identified in holding and the benefits of the procedure were reviewed as well as the consent and risks, the patient was taken to the operating room and with the patient in a comfortable supine position,  general orotracheal anesthesia was induced without difficulty.  A proper time-out was performed.  The Stryker image guidance system was set up and calibrated in the normal fashion and felt to be acceptable.  Next 1% Xylocaine with 1:100,000 epinephrine was infiltrated into the septum, and anterior middle turbinates and uncinate region  bilaterally.  Several minutes were allowed for this to take effect.  Cottonoid pledgets soaked in Afrin were placed into both nasal cavities and left while the patient was prepped and draped in the standard fashion. The image guided suction was calibrated and used to inspect known points in the nasal cavity to assess accuracy of the image guided system. Accuracy was felt to be excellent.   Beginning on the left hand side a hemitransfixion incision was then created on the leading edge of the septum on the left.  A subperichondrial plane was elevated posteriorly on the left and taken back to the  perpendicular plate of the ethmoid where subperiosteal plane was elevated posteriorly on the left. A large septal spur was identified on the left hand side.  An inferior rim of redundant septal cartilage was removed from where it deviated over the maxillary crest. The perpendicular plate of the ethmoid was separated from the quadrangular cartilage, and a subperiosteal plane elevated on the right of the bony septum. The large septal spur was removed, dividing the septal bone superiorly with Knight scissors, and inferiorly from the maxillary crest with a chisel. The spetal cartilage was scored on the left to  relax the curvature into the midline position.  The septum was then replaced in the midline. Reinspection through each nostril showed excellent reduction of the septal deformity. A left posterior inferior fenestration was then created to allow hematoma drainage.  The septal incision was closed with 4-0 chromic gut suture. A 4-0 plain gut suture was used to reapproximate the septal flaps to the underlying cartilage, utilizing a running, quilting type stitch.   The left middle turbinate was medialized and polyps removed from the middle meatus with Blakesly forceps. Next the uncinate process was resected with through-cutting forceps as well as the microdebrider. In this fashion the uncinate was completely removed along with polypoid soft tissue and bone of the medial wall of the maxillary sinus to create a large patent maxillary antrostomy. The left maxillary sinus was suctioned to clear secretions.   Next the left anterior ethmoid sinuses were dissected beginning inferomedially, entering the ethmoid bulla. Thru cut forceps were used to open the anterior ethmoids. The microdebrider  was used as needed to trim loose mucosal edges and remove protruding polyps. A concha bullosa was noted. This was opened with a sickle knife and endoscopic scissors, and polyps removed from the concha bullosa with the microdebrider.    Next the basal lamella was entered and, working back in a sequential fashion through the ethmoid air cells, the ethmoid sinuses were dissected to the posterior ethmoid sinuses, utilizing the image guided suction and the whole time to reassess the anatomy frequently. The microdebrider was also tracked. Thru cutting forceps were used for this dissection with some careful use of the debrider to remove polyps. Care was taken to avoid injury to the lamina papyracea laterally and the skull base superiorly.  Dissection proceeded anteriorly and superiorly into the left frontal recess which was dissected utilizing a 30 scope and frontal curved instruments. The curved image guided suction was used during this dissection to frequently reassess the anatomy on the CT scan. The frontal recess was dissected until a suction could be passed up into the region of the frontal sinus. Small polyps were debrided from this region.  Next the scope was passed medial to the middle turbinate and the left sphenoid recess inspected. Polyps were resected from the sphenoid recess with the microdebrider. With the assistance of the image guided system, the sphenoid sinus was carefully entered through some thin bone at the anterior face of the spenoid, medial to the superior turbinate, and widely opened with the microdebrider. Mucus was suctioned from the sphenoid sinus. A small polyp just inside the os was removed as well. The suction cautery was used to control some bleeding in the sphenoid recess successfully.   Attention was then turned to the right side where the same procedure was performed, opening the maxillary sinus, ethmoid cavities, sphenoid sinus and the frontal recess in the same fashion as described above. A concha bullosa on this side was also opened.   With no active bleeding noted, nasal septal splints were placed within each nostril and affixed to the septum using a 4-0 nylon suture.   The nose was suctioned and  inspected. The maxillary sinuses were irrigated with saline. Stammberger absorbable sinus packing was then placed in the ethmoid cavities bilaterally. Xerogel absorbable packing was also used to help splint the middle meatus open on each side.   The patient was then returned to the anesthesiologist for awakening and taken to recovery room in good condition postoperatively.  Disposition:   PACU and d/c home  Plan: Ice, elevation, narcotic analgesia, steroids, and prophylactic antibiotics. Begin sinus irrigations with saline tomrrow, irrigating 3-4 times daily. Return to the office in 7 days.  Return to work in 7-10 days, no strenuous activities for two weeks.   Riley Nearing 07/03/2020 11:40 AM

## 2020-07-04 ENCOUNTER — Encounter: Payer: Self-pay | Admitting: Otolaryngology

## 2020-07-04 LAB — SURGICAL PATHOLOGY

## 2020-07-07 ENCOUNTER — Encounter: Payer: Self-pay | Admitting: Urology

## 2020-08-20 ENCOUNTER — Other Ambulatory Visit: Payer: Self-pay | Admitting: Urology

## 2020-09-04 ENCOUNTER — Other Ambulatory Visit: Payer: Self-pay

## 2020-09-04 ENCOUNTER — Ambulatory Visit (INDEPENDENT_AMBULATORY_CARE_PROVIDER_SITE_OTHER): Admitting: Urology

## 2020-09-04 VITALS — BP 155/86 | HR 80

## 2020-09-04 DIAGNOSIS — N138 Other obstructive and reflux uropathy: Secondary | ICD-10-CM

## 2020-09-04 DIAGNOSIS — N401 Enlarged prostate with lower urinary tract symptoms: Secondary | ICD-10-CM

## 2020-09-04 DIAGNOSIS — Z8551 Personal history of malignant neoplasm of bladder: Secondary | ICD-10-CM | POA: Diagnosis not present

## 2020-09-04 LAB — BLADDER SCAN AMB NON-IMAGING: Scan Result: 170

## 2020-09-04 NOTE — Progress Notes (Signed)
09/04/2020 9:23 AM   [name] 01/20/1956 [id number]  Referring provider: [name], MD [address] [place],  [place] [address]  No chief complaint on file.   HPI: 65 year old male who returns today for reevaluation.  He was initially seen and evaluated for an abnormal rectal exam but also noted episodes of painless gross hematuria as well as microscopic hematuria. He underwent CT urogram which was suspicious for bladder tumor. 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 Surgical pathology consistent with pTadisease, primarily low-grade with 5% high-grade involvement. Muscle was present and not involved.  Cystoscopy9/2020showedan approximately 2 mm lesion adjacent to the stellate scar on the left lateral bladder wall concerning for low-grade very small recurrence. He ultimately elected to undergo office fulguration which was well-tolerated.  Cysto 09/06/19 showed a small area <1 mm at the dome of the bladder slightly posterior to the level in the midline which is slightly heaped up but no obvious papillary change.  Most recent PSA was 0.7 on 05/03/2020.  At last visit, we discussed consideration of outlet procedure based on obstructive urinary symptoms.  He is currently on Flomax.  IPSS today as below.  PVR is elevated,  170 cc.  Of note, cystoscopy with welcome circumscribed small median lobe.  Today he reports about his sinus surgery in November.  He developed a severe postoperative sinus infection and has been struggling for the last several months to recover from all of this.  He also has a lot of life stressors including a building project for his daughter whose do with her first child.  He also would like to travel out of the state to see his aging mother.  He denies any gross hematuria or dysuria today.    IPSS    Row Name  09/04/20 0900         International Prostate Symptom Score   How often have you had the sensation of not emptying your bladder? Less than half the time     How often have you had to urinate less than every two hours? Less than 1 in 5 times     How often have you found you stopped and started again several times when you urinated? About half the time     How often have you found it difficult to postpone urination? Not at All     How often have you had a weak urinary stream? Less than half the time     How often have you had to strain to start urination? Less than 1 in 5 times     How many times did you typically get up at night to urinate? 1 Time     Total IPSS Score 10           Quality of Life due to urinary symptoms   If you were to spend the rest of your life with your urinary condition just the way it is now how would you feel about that? Mostly Satisfied            Score:  1-7 Mild 8-19 Moderate 20-35 Severe   PMH: Past Medical History:  Diagnosis Date  . Allergy   . Asthma   . 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 Cancer ([place])    bladder  . Chronic obstructive pulmonary disease ([place]) 04/22/2018  . Complication of anesthesia  Asthmatic episode when awakening from colonoscopy  . DISH (diffuse idiopathic skeletal hyperostosis)    neck pain  . GERD (gastroesophageal reflux disease)   . Hyperlipidemia   . Hyperlipidemia   . Inguinal hernia    Left Side  . Nasal congestion    nasal polyps  . Osteopenia   . Osteoporosis   . RA (rheumatoid arthritis) (HCC)    shoulders and hands  . Rheumatoid arthritis (HCC)   . Sciatica   . Sciatica   . Sciatica   . Tinnitus aurium, left     Surgical History: Past Surgical History:  Procedure Laterality Date  . BASAL CELL CARCINOMA EXCISION    . COLONOSCOPY  06/06/2010  . COLONOSCOPY WITH PROPOFOL N/A 06/08/2020   Procedure: COLONOSCOPY WITH PROPOFOL;  Surgeon: Midge Minium, MD;  Location: [facility];  Service:  Endoscopy;  Laterality: N/A;  priority 4  . ETHMOIDECTOMY Bilateral 07/03/2020   Procedure: ETHMOIDECTOMY;  Surgeon: Geanie Logan, MD;  Location: [facility];  Service: ENT;  Laterality: Bilateral;  . FRONTAL SINUS EXPLORATION Bilateral 07/03/2020   Procedure: FRONTAL SINUS EXPLORATION;  Surgeon: Geanie Logan, MD;  Location: [facility];  Service: ENT;  Laterality: Bilateral;  . HERNIA REPAIR    . IMAGE GUIDED SINUS SURGERY N/A 07/03/2020   Procedure: IMAGE GUIDED SINUS SURGERY;  Surgeon: Geanie Logan, MD;  Location: [facility];  Service: ENT;  Laterality: N/A;  need stryker disk PLACE DISK ON OR CHARGE NURSE DESK 10-01 KP Another disk on OR charge nurse desk 10.14.21 DS  . MAXILLARY ANTROSTOMY Bilateral 07/03/2020   Procedure: MAXILLARY ANTROSTOMY with tissue removal;  Surgeon: Geanie Logan, MD;  Location: [facility];  Service: ENT;  Laterality: Bilateral;  . NASAL SEPTOPLASTY W/ TURBINOPLASTY Bilateral 07/03/2020   Procedure: NASAL SEPTOPLASTY;  Surgeon: Geanie Logan, MD;  Location: [facility];  Service: ENT;  Laterality: Bilateral;  . SPHENOIDECTOMY Bilateral 07/03/2020   Procedure: SPHENOIDECTOMY;  Surgeon: Geanie Logan, MD;  Location: [facility];  Service: ENT;  Laterality: Bilateral;  . TRANSURETHRAL RESECTION OF BLADDER TUMOR WITH MITOMYCIN-C N/A 06/21/2018   Procedure: TRANSURETHRAL RESECTION OF BLADDER TUMOR WITH Gemcitabine;  Surgeon: Vanna Scotland, MD;  Location: [facility];  Service: Urology;  Laterality: N/A;  . VARICOCELECTOMY    . VARICOCELECTOMY      Home Medications:  Allergies as of 09/04/2020      Reactions   Pravastatin    Other reaction(s): Nausea Only   Simvastatin    Other reaction(s): Nausea Only      Medication List       Accurate as of September 04, 2020  9:23 AM. If you have any questions, ask your nurse or doctor.        STOP taking these medications   cefdinir 300 MG capsule Commonly known as:  OMNICEF Stopped by: Vanna Scotland, MD   HYDROcodone-acetaminophen 5-325 MG tablet Commonly known as: NORCO/VICODIN Stopped by: Vanna Scotland, MD   predniSONE 10 MG (21) Tbpk tablet Commonly known as: STERAPRED UNI-PAK 21 TAB Stopped by: Vanna Scotland, MD     TAKE these medications   Adalimumab 40 MG/0.8ML Pnkt Inject 40 mg into the skin every 14 (fourteen) days.   alendronate 70 MG tablet Commonly known as: FOSAMAX Take 70 mg by mouth once a week.   calcium-vitamin D 250-100 MG-UNIT tablet Take 1 tablet by mouth 2 (two) times daily.   fluocinonide cream 0.05 % Commonly known as: LIDEX Apply topically 2 (two) times daily.  loratadine 10 MG tablet Commonly known as: CLARITIN Take 10 mg by mouth daily.   meloxicam 15 MG tablet Commonly known as: MOBIC Take 15 mg by mouth daily.   montelukast 10 MG tablet Commonly known as: SINGULAIR TAKE 1 TABLET DAILY   multivitamin capsule Take 1 capsule by mouth daily.   omeprazole 20 MG capsule Commonly known as: PRILOSEC Take 1 capsule (20 mg total) by mouth daily.   ProAir HFA 108 (90 Base) MCG/ACT inhaler Generic drug: albuterol USE 1 INHALATION EVERY 6 HOURS AS NEEDED FOR WHEEZING OR SHORTNESS OF BREATH (NEEDS APPOINTMENT FOR FURTHER REFILLS)   rosuvastatin 20 MG tablet Commonly known as: CRESTOR Take 1 tablet (20 mg total) by mouth daily.   tamsulosin 0.4 MG Caps capsule Commonly known as: FLOMAX TAKE 1 CAPSULE DAILY   triamcinolone 55 MCG/ACT Aero nasal inhaler Commonly known as: NASACORT Place 2 sprays into the nose daily.       Allergies:  Allergies  Allergen Reactions  . Pravastatin     Other reaction(s): Nausea Only  . Simvastatin     Other reaction(s): Nausea Only    Family History: Family History  Problem Relation Age of Onset  . Heart disease Mother   . Asthma Mother   . Heart disease Brother   . Ankylosing spondylitis Brother     Social History:  reports that he has quit smoking.  His smoking use included pipe. He quit after 5.00 years of use. He has never used smokeless tobacco. He reports current alcohol use. He reports that he does not use drugs.   Physical Exam: BP (!) 155/86   Pulse 80   Constitutional:  Alert and oriented, No acute distress. HEENT: [place] AT, moist mucus membranes.  Trachea midline, no masses. Skin: No rashes, bruises or suspicious lesions. Neurologic: Grossly intact, no focal deficits, moving all 4 extremities. Psychiatric: Normal mood and affect.  Results for orders placed or performed in visit on 09/04/20  BLADDER SCAN AMB NON-IMAGING  Result Value Ref Range   Scan Result 170 ml      Assessment & Plan:    1. 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  He may want to do this in early mid summer after things have settled down.  We did have a lengthy discussion today about benefits of consideration of outlet procedure including overall bladder health, sequela of chronic outlet obstruction as well as possible side effects of chronic pharmacotherapy.  All questions answered.  Continue Flomax   - BLADDER SCAN AMB NON-IMAGING  2. History of bladder cancer Due for surveillance cystoscopy in April 2022, advised to keep this follow-up    Hollice Espy, MD  [place] [address], [place], [place] [address] [phone]

## 2020-09-19 ENCOUNTER — Other Ambulatory Visit: Payer: Self-pay

## 2020-09-19 ENCOUNTER — Emergency Department
Admission: EM | Admit: 2020-09-19 | Discharge: 2020-09-19 | Disposition: A | Discharge status: Home / Self Care | Attending: Emergency Medicine | Admitting: Emergency Medicine

## 2020-09-19 ENCOUNTER — Encounter: Payer: Self-pay | Admitting: Emergency Medicine

## 2020-09-19 ENCOUNTER — Emergency Department

## 2020-09-19 DIAGNOSIS — Z8551 Personal history of malignant neoplasm of bladder: Secondary | ICD-10-CM | POA: Insufficient documentation

## 2020-09-19 DIAGNOSIS — S4991XA Unspecified injury of right shoulder and upper arm, initial encounter: Secondary | ICD-10-CM | POA: Diagnosis present

## 2020-09-19 DIAGNOSIS — S40011A Contusion of right shoulder, initial encounter: Secondary | ICD-10-CM | POA: Insufficient documentation

## 2020-09-19 DIAGNOSIS — J45909 Unspecified asthma, uncomplicated: Secondary | ICD-10-CM | POA: Diagnosis not present

## 2020-09-19 DIAGNOSIS — W228XXA Striking against or struck by other objects, initial encounter: Secondary | ICD-10-CM | POA: Diagnosis not present

## 2020-09-19 DIAGNOSIS — Z87891 Personal history of nicotine dependence: Secondary | ICD-10-CM | POA: Diagnosis not present

## 2020-09-19 MED ORDER — ACETAMINOPHEN 500 MG PO TABS: 1000.0000 mg | ORAL_TABLET | Freq: Three times a day (TID) | ORAL | 0 refills | Status: DC | PRN

## 2020-09-19 MED ORDER — ACETAMINOPHEN 500 MG PO TABS
1000.0000 mg | ORAL_TABLET | Freq: Once | ORAL | Status: DC
  Filled 2020-09-19: qty 2

## 2020-09-19 MED ORDER — TRAMADOL HCL 50 MG PO TABS
50.0000 mg | ORAL_TABLET | Freq: Four times a day (QID) | ORAL | 0 refills | Status: DC | PRN
Start: 2020-09-19 — End: 2020-12-11

## 2020-09-19 NOTE — Discharge Instructions (Signed)
Pain control: Take tylenol 1000mg every 8 hours. Take 50mg of tramadol every 6 hours for breakthrough pain. If you need the tramadol make sure to take one senokot as well to prevent constipation. ° °Do not drink alcohol, drive or participate in any other potentially dangerous activities while taking this medication as it may make you sleepy. Do not take this medication with any other sedating medications, either prescription or over-the-counter. ° °

## 2020-09-19 NOTE — ED Triage Notes (Signed)
Patient ambulatory to triage with steady gait, without difficulty or distress noted; pt reports falling over step stool yesterday hitting rt shoulder on doorframe; c/o persistent shoulder pain; denies any injuries

## 2020-09-19 NOTE — ED Notes (Signed)
E-signature not working at this time. Pt verbalized understanding of D/C instructions, prescriptions and follow up care with no further questions at this time. Pt in NAD and ambulatory at time of D/C.  

## 2020-09-19 NOTE — ED Provider Notes (Signed)
[facility] Emergency Department Provider Note  ____________________________________________  Time seen: Approximately 5:45 AM  I have reviewed the triage vital signs and the nursing notes.   HISTORY  Chief Complaint Shoulder Injury   HPI Nathan Anthony is a 65 y.o. male who presents for evaluation of right shoulder pain.  Patient reports that he fell over a stepping stool yesterday and hit the right shoulder onto the door frame.  He has been having constant pain which he describes as severe, sharp, located in the right shoulder radiating down the right arm, worse with movement.  He noticed swelling of the shoulder.  He denies head trauma, chest trauma, or any other injuries.  He tried a sling at home which made the pain worse.   Past Medical History:  Diagnosis Date  . Allergy   . Asthma   . 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 Cancer ([place])    bladder  . Chronic obstructive pulmonary disease ([place]) 04/22/2018  . Complication of anesthesia    Asthmatic episode when awakening from colonoscopy  . DISH (diffuse idiopathic skeletal hyperostosis)    neck pain  . GERD (gastroesophageal reflux disease)   . Hyperlipidemia   . Hyperlipidemia   . Inguinal hernia    Left Side  . Nasal congestion    nasal polyps  . Osteopenia   . Osteoporosis   . RA (rheumatoid arthritis) (HCC)    shoulders and hands  . Rheumatoid arthritis ([place])   . Sciatica   . Sciatica   . Sciatica   . Tinnitus aurium, left     Patient Active Problem List   Diagnosis Date Noted  . Encounter for screening colonoscopy   . 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[place]) 05/03/2020  . Abdominal aortic atherosclerosis ([place]) 04/11/2020  . Acquired deviated nasal septum 09/23/2019  . BPH with obstruction/lower urinary tract symptoms 04/22/2018  . DISH (diffuse idiopathic skeletal hyperostosis) 11/14/2016  . 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 Unilateral inguinal  hernia without obstruction or gangrene 05/22/2015  . GERD (gastroesophageal reflux disease) 11/15/2014  . Hyperlipidemia 11/15/2014  . Mild persistent asthma without complication [id number]  . Osteoporosis without current pathological fracture 11/15/2014  . 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 BASAL CELL CARCINOMA EXCISION    . COLONOSCOPY  06/06/2010  . COLONOSCOPY WITH PROPOFOL N/A 06/08/2020   Procedure: COLONOSCOPY WITH PROPOFOL;  Surgeon: Lucilla Lame, MD;  Location: [facility];  Service: Endoscopy;  Laterality: N/A;  priority 4  . ETHMOIDECTOMY Bilateral 07/03/2020   Procedure: ETHMOIDECTOMY;  Surgeon: Clyde Canterbury, MD;  Location: [place];  Service: ENT;  Laterality: Bilateral;  . FRONTAL SINUS EXPLORATION Bilateral 07/03/2020   Procedure: FRONTAL SINUS EXPLORATION;  Surgeon: Clyde Canterbury, MD;  Location: [place];  Service: ENT;  Laterality: Bilateral;  . HERNIA REPAIR    . IMAGE GUIDED SINUS SURGERY N/A 07/03/2020   Procedure: IMAGE GUIDED SINUS SURGERY;  Surgeon: Clyde Canterbury, MD;  Location: [facility];  Service: ENT;  Laterality: N/A;  need stryker disk PLACE DISK ON OR CHARGE NURSE DESK 10-01 KP Another disk on OR charge nurse desk 10.14.21 DS  . MAXILLARY ANTROSTOMY Bilateral 07/03/2020   Procedure: MAXILLARY ANTROSTOMY with tissue removal;  Surgeon: Clyde Canterbury, MD;  Location: [place];  Service: ENT;  Laterality: Bilateral;  . NASAL SEPTOPLASTY W/ TURBINOPLASTY Bilateral  07/03/2020   Procedure: NASAL SEPTOPLASTY;  Surgeon: Clyde Canterbury, MD;  Location: [place];  Service: ENT;  Laterality: Bilateral;  . SPHENOIDECTOMY Bilateral 07/03/2020   Procedure: SPHENOIDECTOMY;  Surgeon: Clyde Canterbury, MD;  Location: [place];  Service: ENT;  Laterality: Bilateral;  . TRANSURETHRAL RESECTION OF BLADDER  TUMOR WITH MITOMYCIN-C N/A 06/21/2018   Procedure: TRANSURETHRAL RESECTION OF BLADDER TUMOR WITH Gemcitabine;  Surgeon: Hollice Espy, MD;  Location: [facility];  Service: Urology;  Laterality: N/A;  . VARICOCELECTOMY    . VARICOCELECTOMY      Prior to Admission medications   Medication Sig Start Date End Date Taking? Authorizing Provider  acetaminophen (TYLENOL) 500 MG tablet Take 2 tablets (1,000 mg total) by mouth every 8 (eight) hours as needed for mild pain, moderate pain, fever or headache. 09/19/20 09/19/21 Yes Alfred Levins, [place], MD  traMADol (ULTRAM) 50 MG tablet Take 1 tablet (50 mg total) by mouth every 6 (six) hours as needed. 09/19/20 09/19/21 Yes Daphene Chisholm, [place], MD  Adalimumab 40 MG/0.8ML PNKT Inject 40 mg into the skin every 14 (fourteen) days. 03/07/16   [provider]  alendronate (FOSAMAX) 70 MG tablet Take 70 mg by mouth once a week. 10/26/17   [provider]  calcium-vitamin D 250-100 MG-UNIT tablet Take 1 tablet by mouth 2 (two) times daily.    [provider]  fluocinonide cream (LIDEX) 0.05 % Apply topically 2 (two) times daily. 04/27/19   [name], MD  loratadine (CLARITIN) 10 MG tablet Take 10 mg by mouth daily.    [provider]  meloxicam (MOBIC) 15 MG tablet Take 15 mg by mouth daily. 08/20/20   [provider]  montelukast (SINGULAIR) 10 MG tablet TAKE 1 TABLET DAILY 01/02/20   [name], MD  Multiple Vitamin (MULTIVITAMIN) capsule Take 1 capsule by mouth daily.    [provider]  omeprazole (PRILOSEC) 20 MG capsule Take 1 capsule (20 mg total) by mouth daily. 05/03/20   [name], MD  PROAIR HFA 108 [phone] Base) MCG/ACT inhaler USE 1 INHALATION EVERY 6 HOURS AS NEEDED FOR WHEEZING OR SHORTNESS OF BREATH (NEEDS APPOINTMENT FOR FURTHER REFILLS) 04/09/20   Flora Lipps, MD  rosuvastatin (CRESTOR) 20 MG tablet Take 1 tablet (20 mg total) by mouth daily. 05/05/20   [name], MD  tamsulosin  (FLOMAX) 0.4 MG CAPS capsule TAKE 1 CAPSULE DAILY 08/20/20   Hollice Espy, MD  triamcinolone (NASACORT) 55 MCG/ACT AERO nasal inhaler Place 2 sprays into the nose daily.    [provider]    Allergies Pravastatin and Simvastatin  Family History  Problem Relation Age of Onset  . Heart disease Mother   . Asthma Mother   . Heart disease Brother   . Ankylosing spondylitis Brother     Social History Social History   Tobacco Use  . Smoking status: Former Smoker    Years: 5.00    Types: Pipe  . Smokeless tobacco: Never Used  Vaping Use  . Vaping Use: Never used  Substance Use Topics  . Alcohol use: Yes    Comment: occasionally  . Drug use: No    Review of Systems  Constitutional: Negative for fever. Eyes: Negative for visual changes. ENT: Negative for facial injury or neck injury Cardiovascular: Negative for chest injury. Respiratory: Negative for shortness of breath. Negative for chest wall injury. Gastrointestinal: Negative for abdominal pain or injury. Genitourinary: Negative for dysuria. Musculoskeletal: Negative for back injury, + R shoulder pain Skin:  Negative for laceration/abrasions. Neurological: Negative for head injury.   ____________________________________________   PHYSICAL EXAM:  VITAL SIGNS: ED Triage Vitals  Enc Vitals Group     BP 09/19/20 0453 139/61     Pulse Rate 09/19/20 0453 88     Resp 09/19/20 0453 (!) 22     Temp 09/19/20 0453 97.9 F (36.6 C)     Temp Source 09/19/20 0453 Oral     SpO2 09/19/20 0453 95 %     Weight 09/19/20 0452 185 lb (83.9 kg)     Height 09/19/20 0452 5\' 10"  (1.778 m)     Head Circumference --      Peak Flow --      Pain Score 09/19/20 0452 10     Pain Loc --      Pain Edu? --      Excl. in [place]? --     Full spinal precautions maintained throughout the trauma exam. Constitutional: Alert and oriented. No acute distress. Does not appear intoxicated. HEENT Head: Normocephalic and atraumatic. Face:  No facial bony tenderness. Stable midface Ears: No hemotympanum bilaterally. No Battle sign Eyes: No eye injury. PERRL. No raccoon eyes Nose: Nontender. No epistaxis. No rhinorrhea Mouth/Throat: Mucous membranes are moist.  Neck: no C-collar. No midline c-spine tenderness.  Cardiovascular: Normal rate, regular rhythm. Normal and symmetric distal pulses are present in all extremities. Pulmonary/Chest: Chest wall is stable and nontender to palpation/compression. Normal respiratory effort. Breath sounds are normal. No crepitus.  Abdominal: Soft, nontender, non distended. Musculoskeletal: Right shoulder is swollen with a small anterior bruise and diffusely tender to palpation.  Nontender with normal full range of motion in all joints. No deformities. No thoracic or lumbar midline spinal tenderness. Pelvis is stable. Skin: Skin is warm, dry and intact. No abrasions or contutions. Psychiatric: Speech and behavior are appropriate. Neurological: Normal speech and language. Moves all extremities to command. No gross focal neurologic deficits are appreciated.  Glascow Coma Score: 4 - Opens eyes on own 6 - Follows simple motor commands 5 - Alert and oriented GCS: 15   ____________________________________________   LABS (all labs ordered are listed, but only abnormal results are displayed)  Labs Reviewed - No data to display ____________________________________________  EKG  none  ____________________________________________  RADIOLOGY  I have personally reviewed the images performed during this visit and I agree with the Radiologist's read.   Interpretation by Radiologist:  DG Shoulder Right  Result Date: 09/19/2020 CLINICAL DATA:  Pain EXAM: RIGHT SHOULDER - 2+ VIEW COMPARISON:  None. FINDINGS: There is no evidence of fracture or dislocation. There is no evidence of arthropathy or other focal bone abnormality. Soft tissues are unremarkable. Is a possible nondisplaced fracture  involving the lateral fifth rib on the right. IMPRESSION: 1. No acute abnormality involving the patient's right shoulder. 2. Questionable nondisplaced fracture of the lateral fifth rib. Correlation with point tenderness is recommended. No definite evidence for a right-sided pneumothorax. Electronically Signed   By: Constance Holster M.D.   On: 09/19/2020 05:07     ____________________________________________   PROCEDURES  Procedure(s) performed: None Procedures Critical Care performed:  None ____________________________________________   INITIAL IMPRESSION / ASSESSMENT AND PLAN / ED COURSE  65 y.o. male who presents for evaluation of traumatic right shoulder pain.  Patient with swelling and mild bruising of the right shoulder after hitting it on a door frame yesterday.  No other injuries per history and physical exam.  X-ray of the shoulder visualized by me with no signs  of dislocation or fracture, confirmed by radiology.  Questionable fracture of the lateral fifth rib per radiologist but patient has no chest wall trauma and no tenderness to palpation in that location.  He has bilateral lung sounds with no chest pain or shortness of breath.  We will treat with Tylenol 1000 mg 3 times a day and a short course of tramadol as needed every 6 hours.  Patient has a sling at home.  Recommended icing and follow-up with orthopedics if symptoms are not improving within a week.  Discussed my standard return precautions.  Old medical records reviewed.       ____________________________________________  Please note:  Patient was evaluated in Emergency Department today for the symptoms described in the history of present illness. Patient was evaluated in the context of the global COVID-19 pandemic, which necessitated consideration that the patient might be at risk for infection with the SARS-CoV-2 virus that causes COVID-19. Institutional protocols and algorithms that pertain to the evaluation of patients  at risk for COVID-19 are in a state of rapid change based on information released by regulatory bodies including the CDC and federal and state organizations. These policies and algorithms were followed during the patient's care in the ED.  Some ED evaluations and interventions may be delayed as a result of limited staffing during the pandemic.   ____________________________________________   FINAL CLINICAL IMPRESSION(S) / ED DIAGNOSES   Final diagnoses:  Contusion of right shoulder, initial encounter      NEW MEDICATIONS STARTED DURING THIS VISIT:  ED Discharge Orders         Ordered    acetaminophen (TYLENOL) 500 MG tablet  Every 8 hours PRN        09/19/20 0544    traMADol (ULTRAM) 50 MG tablet  Every 6 hours PRN        09/19/20 0544           Note:  This document was prepared using Dragon voice recognition software and may include unintentional dictation errors.    Rudene Re, MD 09/19/20 [phone]

## 2020-09-19 NOTE — ED Notes (Signed)
Pt c/o right sided shoulder pain. Pt able to move affected extremity, but c/o pain with movement.

## 2020-09-24 ENCOUNTER — Encounter: Payer: Self-pay | Admitting: Internal Medicine

## 2020-10-10 ENCOUNTER — Encounter: Payer: Self-pay | Admitting: Primary Care

## 2020-10-10 ENCOUNTER — Ambulatory Visit (INDEPENDENT_AMBULATORY_CARE_PROVIDER_SITE_OTHER): Admitting: Primary Care

## 2020-10-10 ENCOUNTER — Other Ambulatory Visit: Payer: Self-pay

## 2020-10-10 DIAGNOSIS — Z87891 Personal history of nicotine dependence: Secondary | ICD-10-CM | POA: Diagnosis not present

## 2020-10-10 DIAGNOSIS — J449 Chronic obstructive pulmonary disease, unspecified: Secondary | ICD-10-CM | POA: Diagnosis not present

## 2020-10-10 NOTE — Assessment & Plan Note (Signed)
-   40+ pack year hx - Refer to lung cancer screening program

## 2020-10-10 NOTE — Patient Instructions (Addendum)
Recommendations: Continue Proair rescue inhaler - 2 puffs every 6 hours as needed for shortness of breath or wheezing Continue Singulair 10mg  at bedtime  Take over the counter mucinex 600mg  twice a day as needed for cough If you develop worsening shortness of breath, chest tightness, wheezing or purulent mucus please notify our office   Referral Lung cancer screening program   Follow-up 1 year with Dr. Mortimer Fries   Chronic Obstructive Pulmonary Disease  Chronic obstructive pulmonary disease (COPD) is a long-term (chronic) lung problem. When you have COPD, it is hard for air to get in and out of your lungs. Usually the condition gets worse over time, and your lungs will never return to normal. There are things you can do to keep yourself as healthy as possible. What are the causes?  Smoking. This is the most common cause.  Certain genes passed from parent to child (inherited). What increases the risk?  Being exposed to secondhand smoke from cigarettes, pipes, or cigars.  Being exposed to chemicals and other irritants, such as fumes and dust in the work environment.  Having chronic lung conditions or infections. What are the signs or symptoms?  Shortness of breath, especially during physical activity.  A long-term cough with a large amount of thick mucus. Sometimes, the cough may not have any mucus (dry cough).  Wheezing.  Breathing quickly.  Skin that looks gray or blue, especially in the fingers, toes, or lips.  Feeling tired (fatigue).  Weight loss.  Chest tightness.  Having infections often.  Episodes when breathing symptoms become much worse (exacerbations). At the later stages of this disease, you may have swelling in the ankles, feet, or legs. How is this treated?  Taking medicines.  Quitting smoking, if you smoke.  Rehabilitation. This includes steps to make your body work better. It may involve a team of specialists.  Doing exercises.  Making changes to  your diet.  Using oxygen.  Lung surgery.  Lung transplant.  Comfort measures (palliative care). Follow these instructions at home: Medicines  Take over-the-counter and prescription medicines only as told by your doctor.  Talk to your doctor before taking any cough or allergy medicines. You may need to avoid medicines that cause your lungs to be dry. Lifestyle  If you smoke, stop smoking. Smoking makes the problem worse.  Do not smoke or use any products that contain nicotine or tobacco. If you need help quitting, ask your doctor.  Avoid being around things that make your breathing worse. This may include smoke, chemicals, and fumes.  Stay active, but remember to rest as well.  Learn and use tips on how to manage stress and control your breathing.  Make sure you get enough sleep. Most adults need at least 7 hours of sleep every night.  Eat healthy foods. Eat smaller meals more often. Rest before meals. Controlled breathing Learn and use tips on how to control your breathing as told by your doctor. Try:  Breathing in (inhaling) through your nose for 1 second. Then, pucker your lips and breath out (exhale) through your lips for 2 seconds.  Putting one hand on your belly (abdomen). Breathe in slowly through your nose for 1 second. Your hand on your belly should move out. Pucker your lips and breathe out slowly through your lips. Your hand on your belly should move in as you breathe out.   Controlled coughing Learn and use controlled coughing to clear mucus from your lungs. Follow these steps: 1. Lean your head a  little forward. 2. Breathe in deeply. 3. Try to hold your breath for 3 seconds. 4. Keep your mouth slightly open while coughing 2 times. 5. Spit any mucus out into a tissue. 6. Rest and do the steps again 1 or 2 times as needed. General instructions  Make sure you get all the shots (vaccines) that your doctor recommends. Ask your doctor about a flu shot and a  pneumonia shot.  Use oxygen therapy and pulmonary rehabilitation if told by your doctor. If you need home oxygen therapy, ask your doctor if you should buy a tool to measure your oxygen level (oximeter).  Make a COPD action plan with your doctor. This helps you to know what to do if you feel worse than usual.  Manage any other conditions you have as told by your doctor.  Avoid going outside when it is very hot, cold, or humid.  Avoid people who have a sickness you can catch (contagious).  Keep all follow-up visits. Contact a doctor if:  You cough up more mucus than usual.  There is a change in the color or thickness of the mucus.  It is harder to breathe than usual.  Your breathing is faster than usual.  You have trouble sleeping.  You need to use your medicines more often than usual.  You have trouble doing your normal activities such as getting dressed or walking around the house. Get help right away if:  You have shortness of breath while resting.  You have shortness of breath that stops you from: ? Being able to talk. ? Doing normal activities.  Your chest hurts for longer than 5 minutes.  Your skin color is more blue than usual.  Your pulse oximeter shows that you have low oxygen for longer than 5 minutes.  You have a fever.  You feel too tired to breathe normally. These symptoms may represent a serious problem that is an emergency. Do not wait to see if the symptoms will go away. Get medical help right away. Call your local emergency services (911 in the [place] Do not drive yourself to the hospital. Summary  Chronic obstructive pulmonary disease (COPD) is a long-term lung problem.  The way your lungs work will never return to normal. Usually the condition gets worse over time. There are things you can do to keep yourself as healthy as possible.  Take over-the-counter and prescription medicines only as told by your doctor.  If you smoke, stop. Smoking makes  the problem worse. This information is not intended to replace advice given to you by your health care provider. Make sure you discuss any questions you have with your health care provider. Document Revised: 06/26/2020 Document Reviewed: 06/26/2020 Elsevier Patient Education  2021 [employer].

## 2020-10-10 NOTE — Progress Notes (Signed)
@Patient  ID: Nathan Anthony, male    DOB: 1955/11/13, 65 y.o.   MRN: [id number]  Chief Complaint  Patient presents with  . Follow-up    Sob and prod cough with grayish to yellow sputum. Feels sx are related to weather change    Referring provider: [name], MD  HPI: 65 year old male, Former smoker.  Past medical history significant for mild persistent asthma, nasal septum deviation, aortic arthrosclerosis, GERD, rheumatoid arthritis, bladder cancer, calcified granuloma of lung, hyperlipidemia.  Patient of Dr. Mortimer Fries, last seen in office on 08/04/2019.  Consistent with moderate COPD.  Sleep study negative for OSA.  10/10/2020 Presents today for annual follow-up.  He has been off Advair for the last 1 to 2 years. Feels his breathing has been remarkably better since sinus surgery. Asthma still causes some problems especially with weather changes. He is still using Albuterol rescue inhaler on average 6 times a month. Sometimes he will not use it for a whole month. Other times he will need to use it daily. His breathing is no worse than it has been the last 5 years. He feels better since being off maintenance ICS/LABA inhaler.  He takes Singulair, notices a difference when he does not take it. CAT score 9  Allergies  Allergen Reactions  . Pravastatin     Other reaction(s): Nausea Only  . Simvastatin     Other reaction(s): Nausea Only    Immunization History  Administered Date(s) Administered  . Influenza,inj,Quad PF,6+ Mos 05/22/2015, 08/07/2016, 06/25/2018, 06/21/2019, 05/03/2020  . Influenza,inj,Quad PF,6-35 Mos 05/22/2015, 08/07/2016  . Janssen (J&J) SARS-COV-2 Vaccination 12/12/2019  . Moderna Sars-Covid-2 Vaccination 07/30/2020  . Pneumococcal Polysaccharide-23 04/22/2017  . Tdap 12/12/2014    Past Medical History:  Diagnosis Date  . Allergy   . Asthma   . 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[place])   . Cancer ([place])    bladder  . Chronic obstructive pulmonary disease ([place])  04/22/2018  . Complication of anesthesia    Asthmatic episode when awakening from colonoscopy  . DISH (diffuse idiopathic skeletal hyperostosis)    neck pain  . GERD (gastroesophageal reflux disease)   . Hyperlipidemia   . Hyperlipidemia   . Inguinal hernia    Left Side  . Nasal congestion    nasal polyps  . Osteopenia   . Osteoporosis   . RA (rheumatoid arthritis) ([facility])    shoulders and hands  . Rheumatoid arthritis ([place])   . Sciatica   . Sciatica   . Sciatica   . Tinnitus aurium, left     Tobacco History: Social History   Tobacco Use  Smoking Status Former Smoker  . Years: 5.00  . Types: Pipe  Smokeless Tobacco Never Used   Counseling given: Not Answered   Outpatient Medications Prior to Visit  Medication Sig Dispense Refill  . acetaminophen (TYLENOL) 500 MG tablet Take 2 tablets (1,000 mg total) by mouth every 8 (eight) hours as needed for mild pain, moderate pain, fever or headache. 50 tablet 0  . Adalimumab 40 MG/0.8ML PNKT Inject 40 mg into the skin every 14 (fourteen) days.    Marland Kitchen alendronate (FOSAMAX) 70 MG tablet Take 70 mg by mouth once a week.    . calcium-vitamin D 250-100 MG-UNIT tablet Take 1 tablet by mouth 2 (two) times daily.    . fluocinonide cream (LIDEX) 0.05 % Apply topically 2 (two) times daily. 30 g 0  . loratadine (CLARITIN) 10 MG tablet Take 10 mg by mouth daily.    Marland Kitchen  meloxicam (MOBIC) 15 MG tablet Take 15 mg by mouth daily.    . montelukast (SINGULAIR) 10 MG tablet TAKE 1 TABLET DAILY 90 tablet 3  . Multiple Vitamin (MULTIVITAMIN) capsule Take 1 capsule by mouth daily.    Marland Kitchen omeprazole (PRILOSEC) 20 MG capsule Take 1 capsule (20 mg total) by mouth daily. 90 capsule 3  . PROAIR HFA 108 (90 Base) MCG/ACT inhaler USE 1 INHALATION EVERY 6 HOURS AS NEEDED FOR WHEEZING OR SHORTNESS OF BREATH (NEEDS APPOINTMENT FOR FURTHER REFILLS) 25.5 g 3  . rosuvastatin (CRESTOR) 20 MG tablet Take 1 tablet (20 mg total) by mouth daily. 90 tablet 1  . tamsulosin  (FLOMAX) 0.4 MG CAPS capsule TAKE 1 CAPSULE DAILY 90 capsule 3  . triamcinolone (NASACORT) 55 MCG/ACT AERO nasal inhaler Place 2 sprays into the nose daily.    . traMADol (ULTRAM) 50 MG tablet Take 1 tablet (50 mg total) by mouth every 6 (six) hours as needed. 20 tablet 0   No facility-administered medications prior to visit.    Review of Systems  Review of Systems  Constitutional: Negative.   Respiratory: Positive for shortness of breath. Negative for cough, chest tightness and wheezing.    Physical Exam  BP (!) 144/82 (BP Location: Left Arm, Cuff Size: Normal)   Pulse 95   Temp 98 F (36.7 C) (Temporal)   Ht 5\' 10"  (1.778 m)   Wt 195 lb (88.5 kg)   SpO2 98%   BMI 27.98 kg/m  Physical Exam Constitutional:      Appearance: Normal appearance.  HENT:     Head: Normocephalic and atraumatic.     Mouth/Throat:     Comments: Deferred d/t masking Cardiovascular:     Rate and Rhythm: Normal rate and regular rhythm.  Pulmonary:     Effort: Pulmonary effort is normal.     Breath sounds: Normal breath sounds. No wheezing or rhonchi.     Comments: CTA Neurological:     General: No focal deficit present.     Mental Status: He is alert and oriented to person, place, and time. Mental status is at baseline.  Psychiatric:        Mood and Affect: Mood normal.        Behavior: Behavior normal.        Thought Content: Thought content normal.        Judgment: Judgment normal.      Lab Results:  CBC    Component Value Date/Time   WBC 9.3 05/03/2020 0937   WBC 8.2 01/05/2017 0902   RBC 5.13 05/03/2020 0937   RBC 4.93 01/05/2017 0902   HGB 14.4 05/03/2020 0937   HCT 43.0 05/03/2020 0937   PLT 370 05/03/2020 0937   MCV 84 05/03/2020 0937   MCH 28.1 05/03/2020 0937   MCH 30.7 01/05/2017 0902   MCHC 33.5 05/03/2020 0937   MCHC 34.6 01/05/2017 0902   RDW 13.3 05/03/2020 0937   LYMPHSABS 2.5 05/03/2020 0937   MONOABS 0.6 01/05/2017 0902   EOSABS 0.6 (H) 05/03/2020 0937    BASOSABS 0.1 05/03/2020 0937    BMET    Component Value Date/Time   NA 141 05/03/2020 0937   K 4.6 05/03/2020 0937   CL 102 05/03/2020 0937   CO2 24 05/03/2020 0937   GLUCOSE 85 05/03/2020 0937   BUN 10 05/03/2020 0937   CREATININE 0.82 05/03/2020 0937   CALCIUM 9.4 05/03/2020 0937   GFRNONAA 94 05/03/2020 0937   GFRAA 109 05/03/2020 1517  BNP No results found for: BNP  ProBNP No results found for: PROBNP  Imaging: DG Anthony Right  Result Date: 09/19/2020 CLINICAL DATA:  Pain EXAM: RIGHT Anthony - 2+ VIEW COMPARISON:  None. FINDINGS: There is no evidence of fracture or dislocation. There is no evidence of arthropathy or other focal bone abnormality. Soft tissues are unremarkable. Is a possible nondisplaced fracture involving the lateral fifth rib on the right. IMPRESSION: 1. No acute abnormality involving the patient's right Anthony. 2. Questionable nondisplaced fracture of the lateral fifth rib. Correlation with point tenderness is recommended. No definite evidence for a right-sided pneumothorax. Electronically Signed   By: Constance Holster M.D.   On: 09/19/2020 05:07     Assessment & Plan:   COPD mixed type ([place]) - Stable interval; Well controlled on prn SABA. He feels better since being off ICS/LABA. Breathing is baseline.  Weather changes affect his breathing. Uses SABA 6 times a month on average.  - Continue Singulair 10mg  at bedtime and Proair hfa 2 puffs every 6 hours as needed  - Recommend mucinex 600mg  twice a day as needed for cough - Advised patient that if he develops worsening shortness of breath, chest tightness, wheezing or purulent mucus to notify our office  - FU in 1 year with Dr. Mortimer Fries   Former smoker - 40+ pack year hx - Refer to lung cancer screening program    Martyn Ehrich, NP 10/10/2020

## 2020-10-10 NOTE — Assessment & Plan Note (Signed)
-   Stable interval; Well controlled on prn SABA. He feels better since being off ICS/LABA. Breathing is baseline.  Weather changes affect his breathing. Uses SABA 6 times a month on average.  - Continue Singulair 10mg  at bedtime and Proair hfa 2 puffs every 6 hours as needed  - Recommend mucinex 600mg  twice a day as needed for cough - Advised patient that if he develops worsening shortness of breath, chest tightness, wheezing or purulent mucus to notify our office  - FU in 1 year with Dr. Mortimer Fries

## 2020-10-23 ENCOUNTER — Telehealth: Payer: Self-pay | Admitting: *Deleted

## 2020-10-23 NOTE — Telephone Encounter (Signed)
Only cigarettes. Cigars only count if they are also smoking cigarettes, It is the guidelines. We don't agree with them, but it is what it is. Thanks for trying though.

## 2020-10-23 NOTE — Telephone Encounter (Signed)
Does cigar/pipe smoking not qualify patient for lung cancer screening? Could you possibly look to see if this patient would qualify for lung cancer screening?

## 2020-10-23 NOTE — Telephone Encounter (Signed)
Contacted patient to discuss lung screening referral. Patient reports only smoking cigars or pipes in past 15 years. He is made aware that he does not meet current lung cancer screening eligibility requirements due to no cigarette smoking in past 15 years.

## 2020-12-10 NOTE — Progress Notes (Signed)
   12/11/20   CC:  Chief Complaint  Patient presents with  . Cysto    HPI: Nathan Anthony is a 65 y.o. male who returns for a cystoscopy.   He was initially seen and evaluated for an abnormal rectal exam but also noted episodes of painless gross hematuria as well as microscopic hematuria. He underwent CT urogram which was suspicious for bladder tumor. 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 Surgical pathology consistent with pTadisease, primarily low-grade with 5% high-grade involvement. Muscle was present and not involved.  Cystoscopy9/2020showedan approximately 2 mm lesion adjacent to the stellate scar on the left lateral bladder wall concerning for low-grade very small recurrence. He ultimately elected to undergo office fulguration which was well-tolerated.  Cysto 09/06/19 showed a small area <1 mm at the dome of the bladder slightly posterior to the level in the midline which is slightly heaped up but no obvious papillary change.   Most recent PSA was 0.7 on 05/03/2020.   Denies hematuria. Reports issues with urination previously consider outlet surgery but wants to hold off in light of recent shoulder injury.     Blood pressure (!) 177/82, pulse 77, height 5\' 10"  (1.778 m), weight 190 lb (86.2 kg). NED. A&Ox3.   No respiratory distress   Abd soft, NT, ND Normal phallus with bilateral descended testicles  Cystoscopy Procedure Note  Patient identification was confirmed, informed consent was obtained, and patient was prepped using Betadine solution.  Lidocaine jelly was administered per urethral meatus.     Pre-Procedure: - Inspection reveals a normal caliber ureteral meatus.  Procedure: The flexible cystoscope was introduced without difficulty - No urethral strictures/lesions are present. - Enlarged prostate with median lobe - Normal bladder  neck - Bilateral ureteral orifices identified - Bladder mucosa  reveals no ulcers, tumors, or lesions - No bladder stones - Mild trabeculation  Retroflexion shows small median lobe cricumscribed    Post-Procedure: - Patient tolerated the procedure well  Assessment/ Plan:  1. History of malignant neoplasm of lateral wall of urinary bladder Follow up in 6 months for repeat cystoscopy Patient is agreeable  2. BPH with bladder outlet obstruction  Continue Flomax May consider HoLEP vs. TURP but defering for now, will let us know  Cysto in 6 months   Hollice Espy, MD

## 2020-12-11 ENCOUNTER — Other Ambulatory Visit: Payer: Self-pay

## 2020-12-11 ENCOUNTER — Ambulatory Visit (INDEPENDENT_AMBULATORY_CARE_PROVIDER_SITE_OTHER): Admitting: Urology

## 2020-12-11 VITALS — BP 177/82 | HR 77 | Ht 70.0 in | Wt 190.0 lb

## 2020-12-11 DIAGNOSIS — Z8551 Personal history of malignant neoplasm of bladder: Secondary | ICD-10-CM | POA: Diagnosis not present

## 2020-12-11 DIAGNOSIS — N401 Enlarged prostate with lower urinary tract symptoms: Secondary | ICD-10-CM

## 2020-12-11 DIAGNOSIS — N138 Other obstructive and reflux uropathy: Secondary | ICD-10-CM

## 2020-12-11 LAB — URINALYSIS, COMPLETE
Bilirubin, UA: NEGATIVE
Glucose, UA: NEGATIVE
Ketones, UA: NEGATIVE
Nitrite, UA: NEGATIVE
Protein,UA: NEGATIVE
Specific Gravity, UA: 1.01 (ref 1.005–1.030)
Urobilinogen, Ur: 0.2 mg/dL (ref 0.2–1.0)
pH, UA: 6.5 (ref 5.0–7.5)

## 2020-12-11 LAB — MICROSCOPIC EXAMINATION
Bacteria, UA: NONE SEEN
Epithelial Cells (non renal): NONE SEEN /hpf (ref 0–10)

## 2020-12-20 ENCOUNTER — Other Ambulatory Visit: Payer: Self-pay | Admitting: Internal Medicine

## 2020-12-20 DIAGNOSIS — I700 Atherosclerosis of aorta: Secondary | ICD-10-CM

## 2021-01-15 ENCOUNTER — Other Ambulatory Visit: Payer: Self-pay | Admitting: Internal Medicine

## 2021-01-15 DIAGNOSIS — J4530 Mild persistent asthma, uncomplicated: Secondary | ICD-10-CM

## 2021-04-09 ENCOUNTER — Other Ambulatory Visit: Payer: Self-pay | Admitting: Family Medicine

## 2021-04-09 DIAGNOSIS — I700 Atherosclerosis of aorta: Secondary | ICD-10-CM

## 2021-04-09 NOTE — Telephone Encounter (Signed)
Requested medication (s) are due for refill today: yes   Requested medication (s) are on the active medication list: yes   Last refill:  12/20/2020  Future visit scheduled: yes  Notes to clinic: Patient has upcoming appt on 05/10/2021   Failed protocol:   LDL in normal range and within 360 days   HDL in normal range and within 360 days   Triglycerides in normal range and within 360 days     Requested Prescriptions  Pending Prescriptions Disp Refills   rosuvastatin (CRESTOR) 20 MG tablet [Pharmacy Med Name: ROSUVASTATIN TABS '20MG'$ ] 90 tablet 3    Sig: TAKE 1 TABLET DAILY ( CHANGE FROM PREVIOUS PRESCRIPTION SENT ON 9/2 FOR 10 MG )      Cardiovascular:  Antilipid - Statins Failed - 04/09/2021  8:24 AM      Failed - LDL in normal range and within 360 days    LDL Chol Calc (NIH)  Date Value Ref Range Status  05/03/2020 108 (H) 0 - 99 mg/dL Final          Failed - HDL in normal range and within 360 days    HDL  Date Value Ref Range Status  05/03/2020 36 (L) >39 mg/dL Final          Failed - Triglycerides in normal range and within 360 days    Triglycerides  Date Value Ref Range Status  05/03/2020 240 (H) 0 - 149 mg/dL Final          Passed - Total Cholesterol in normal range and within 360 days    Cholesterol, Total  Date Value Ref Range Status  05/03/2020 186 100 - 199 mg/dL Final          Passed - Patient is not pregnant      Passed - Valid encounter within last 12 months    Recent Outpatient Visits           11 months ago Annual physical exam   [facility] [name], MD   12 months ago Cellulitis of left lower extremity   [facility] [name], MD   1 year ago Annual physical exam   [facility] [name], MD   2 years ago Prostate cancer screening   [facility] [name], MD   3 years ago Annual physical exam   [facility] [name], MD       Future Appointments              In 1 month [name], Jesse Sans, MD [facility], [facility]

## 2021-05-03 ENCOUNTER — Encounter: Payer: Self-pay | Admitting: Internal Medicine

## 2021-05-07 ENCOUNTER — Other Ambulatory Visit: Payer: Self-pay | Admitting: Internal Medicine

## 2021-05-07 DIAGNOSIS — K219 Gastro-esophageal reflux disease without esophagitis: Secondary | ICD-10-CM

## 2021-05-07 NOTE — Telephone Encounter (Signed)
Requested Prescriptions  Pending Prescriptions Disp Refills  . omeprazole (PRILOSEC) 20 MG capsule [Pharmacy Med Name: OMEPRAZOLE DR CAPS '20MG'$ ] 90 capsule 0    Sig: TAKE 1 CAPSULE DAILY     Gastroenterology: Proton Pump Inhibitors Failed - 05/07/2021  8:02 AM      Failed - Valid encounter within last 12 months    Recent Outpatient Visits          1 year ago Annual physical exam   [facility] [name], MD   1 year ago Cellulitis of left lower extremity   [facility] [name], MD   2 years ago Annual physical exam   [facility] [name], MD   3 years ago Prostate cancer screening   [facility] [name], MD   4 years ago Annual physical exam   [facility] [name], MD      Future Appointments            In 3 days [name], MD [facility], [facility]

## 2021-05-09 NOTE — Progress Notes (Signed)
Date:  05/10/2021   Name:  Nathan Anthony   DOB:  [birth year]   MRN:  [id number]   Chief Complaint: Annual Exam (Flu shot today. ) Nathan Anthony is a 65 y.o. male who presents today for his Complete Annual Exam. He feels well. He reports exercising - walking some. He reports he is sleeping fairly well. Flu shot today.   Colonoscopy: 06/2020  Immunization History  Administered Date(s) Administered   Influenza,inj,Quad PF,6+ Mos 05/22/2015, 08/07/2016, 06/25/2018, 06/21/2019, 05/03/2020   Influenza,inj,Quad PF,6-35 Mos 05/22/2015, 08/07/2016   Janssen (J&J) SARS-COV-2 Vaccination 12/12/2019   Moderna Sars-Covid-2 Vaccination 07/30/2020   OPV 10/20/1995   Pneumococcal Polysaccharide-23 04/22/2017   Tdap 12/12/2014   Yellow Fever 06/28/1992     Gastroesophageal Reflux He complains of heartburn. He reports no abdominal pain, no chest pain, no choking or no wheezing. This is a recurrent problem. The problem occurs occasionally. Pertinent negatives include no fatigue. He has tried a PPI for the symptoms.  Hyperlipidemia This is a chronic problem. The problem is controlled. Associated symptoms include shortness of breath. Pertinent negatives include no chest pain or myalgias. Current antihyperlipidemic treatment includes statins. The current treatment provides significant improvement of lipids.  COPD - recently seen by Pulmonary.  Dx consistent with COPD/asthma.  Currently using albuterol and singulair.  D/c's Advair several years ago due to improved sx and sense that Advair made some sx worse.  Using albuterol MDI about twice a week.  Lab Results  Component Value Date   CREATININE 0.82 05/03/2020   BUN 10 05/03/2020   NA 141 05/03/2020   K 4.6 05/03/2020   CL 102 05/03/2020   CO2 24 05/03/2020   Lab Results  Component Value Date   CHOL 186 05/03/2020   HDL 36 (L) 05/03/2020   LDLCALC 108 (H) 05/03/2020   TRIG 240 (H) 05/03/2020   CHOLHDL 5.2 (H) 05/03/2020   No  results found for: TSH No results found for: HGBA1C Lab Results  Component Value Date   WBC 9.3 05/03/2020   HGB 14.4 05/03/2020   HCT 43.0 05/03/2020   MCV 84 05/03/2020   PLT 370 05/03/2020   Lab Results  Component Value Date   ALT 15 05/03/2020   AST 20 05/03/2020   ALKPHOS 81 05/03/2020   BILITOT 0.3 05/03/2020     Review of Systems  Constitutional:  Negative for appetite change, chills, diaphoresis, fatigue and unexpected weight change.  HENT:  Negative for hearing loss, tinnitus, trouble swallowing and voice change.   Eyes:  Negative for visual disturbance.  Respiratory:  Positive for shortness of breath. Negative for choking and wheezing.   Cardiovascular:  Negative for chest pain, palpitations and leg swelling.  Gastrointestinal:  Positive for heartburn. Negative for abdominal pain, blood in stool, constipation and diarrhea.  Genitourinary:  Negative for difficulty urinating, dysuria, frequency and hematuria.  Musculoskeletal:  Positive for arthralgias and back pain. Negative for myalgias.  Skin:  Negative for color change and rash.  Neurological:  Negative for dizziness, syncope and headaches.  Hematological:  Negative for adenopathy.  Psychiatric/Behavioral:  Negative for dysphoric mood and sleep disturbance. The patient is not nervous/anxious.    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[place]) 05/03/2020   Abdominal aortic atherosclerosis ([place]) 04/11/2020   Acquired deviated nasal septum 09/23/2019   BPH with obstruction/lower urinary tract  symptoms 04/22/2018   DISH (diffuse idiopathic skeletal hyperostosis) 11/14/2016   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[place]) 02/06/2016   Unilateral inguinal hernia without obstruction or gangrene 05/22/2015   GERD (gastroesophageal reflux disease) 11/15/2014   Hyperlipidemia 11/15/2014   COPD mixed type ([place])  11/15/2014   Osteoporosis without current pathological fracture 11/15/2014   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[place]) 11/15/2014    Allergies  Allergen Reactions   Pravastatin     Other reaction(s): Nausea Only   Simvastatin     Other reaction(s): Nausea Only    Past Surgical History:  Procedure Laterality Date   BASAL CELL CARCINOMA EXCISION     COLONOSCOPY  06/06/2010   COLONOSCOPY WITH PROPOFOL N/A 06/08/2020   Procedure: COLONOSCOPY WITH PROPOFOL;  Surgeon: Lucilla Lame, MD;  Location: [place];  Service: Endoscopy;  Laterality: N/A;  priority 4   ETHMOIDECTOMY Bilateral 07/03/2020   Procedure: ETHMOIDECTOMY;  Surgeon: Clyde Canterbury, MD;  Location: [place];  Service: ENT;  Laterality: Bilateral;   FRONTAL SINUS EXPLORATION Bilateral 07/03/2020   Procedure: FRONTAL SINUS EXPLORATION;  Surgeon: Clyde Canterbury, MD;  Location: [place];  Service: ENT;  Laterality: Bilateral;   HERNIA REPAIR     IMAGE GUIDED SINUS SURGERY N/A 07/03/2020   Procedure: IMAGE GUIDED SINUS SURGERY;  Surgeon: Clyde Canterbury, MD;  Location: [facility] [place];  Service: ENT;  Laterality: N/A;  need [employer] disk PLACE DISK ON OR CHARGE NURSE DESK 10-01 KP Another disk on OR charge nurse desk 10.14.21 DS   MAXILLARY ANTROSTOMY Bilateral 07/03/2020   Procedure: MAXILLARY ANTROSTOMY with tissue removal;  Surgeon: Clyde Canterbury, MD;  Location: [place];  Service: ENT;  Laterality: Bilateral;   NASAL SEPTOPLASTY W/ TURBINOPLASTY Bilateral 07/03/2020   Procedure: NASAL SEPTOPLASTY;  Surgeon: Clyde Canterbury, MD;  Location: [place];  Service: ENT;  Laterality: Bilateral;   SPHENOIDECTOMY Bilateral 07/03/2020   Procedure: Coralee Pesa;  Surgeon: Clyde Canterbury, MD;  Location: [place];  Service: ENT;  Laterality: Bilateral;   TRANSURETHRAL RESECTION OF BLADDER TUMOR WITH MITOMYCIN-C N/A 06/21/2018   Procedure:  TRANSURETHRAL RESECTION OF BLADDER TUMOR WITH Gemcitabine;  Surgeon: Hollice Espy, MD;  Location: [facility];  Service: Urology;  Laterality: N/A;   VARICOCELECTOMY     VARICOCELECTOMY      Social History   Tobacco Use   Smoking status: Former    Types: Pipe    Quit date: 2019    Years since quitting: 3.6   Smokeless tobacco: Never  Vaping Use   Vaping Use: Never used  Substance Use Topics   Alcohol use: Yes    Comment: occasionally   Drug use: No     Medication list has been reviewed and updated.  Current Meds  Medication Sig   Adalimumab 40 MG/0.8ML PNKT Inject 40 mg into the skin every 14 (fourteen) days.   alendronate (FOSAMAX) 70 MG tablet Take 70 mg by mouth once a week.   calcium-vitamin D 250-100 MG-UNIT tablet Take 1 tablet by mouth 2 (two) times daily.   fluocinonide cream (LIDEX) 0.05 % Apply topically 2 (two) times daily.   loratadine (CLARITIN) 10 MG tablet Take 10 mg by mouth daily.   meloxicam (MOBIC) 15 MG tablet Take 15 mg by mouth daily.   montelukast (SINGULAIR) 10 MG tablet TAKE 1 TABLET DAILY   Multiple Vitamin (MULTIVITAMIN) capsule Take 1 capsule by mouth daily.   omeprazole (PRILOSEC) 20 MG capsule TAKE 1 CAPSULE DAILY  PROAIR HFA 108 (90 Base) MCG/ACT inhaler USE 1 INHALATION EVERY 6 HOURS AS NEEDED FOR WHEEZING OR SHORTNESS OF BREATH (NEEDS APPOINTMENT FOR FURTHER REFILLS)   rosuvastatin (CRESTOR) 20 MG tablet TAKE 1 TABLET DAILY ( CHANGE FROM PREVIOUS PRESCRIPTION SENT ON 9/2 FOR 10 MG )   tamsulosin (FLOMAX) 0.4 MG CAPS capsule TAKE 1 CAPSULE DAILY   triamcinolone (NASACORT) 55 MCG/ACT AERO nasal inhaler Place 2 sprays into the nose daily.    PHQ 2/9 Scores 05/10/2021 05/03/2020 04/27/2019 04/22/2018  PHQ - 2 Score 0 0 0 0  PHQ- 9 Score 0 0 - -    GAD 7 : Generalized Anxiety Score 05/10/2021  Nervous, Anxious, on Edge 0  Control/stop worrying 0  Worry too much - different things 0  Trouble relaxing 0  Restless 0  Easily annoyed or irritable 0   Afraid - awful might happen 0  Total GAD 7 Score 0  Anxiety Difficulty Not difficult at all    BP Readings from Last 3 Encounters:  05/10/21 122/78  12/11/20 (!) 177/82  10/10/20 (!) 144/82    Physical Exam Vitals and nursing note reviewed.  Constitutional:      Appearance: Normal appearance. He is well-developed.  HENT:     Head: Normocephalic.     Right Ear: Tympanic membrane, ear canal and external ear normal.     Left Ear: Tympanic membrane, ear canal and external ear normal.     Nose: Nose normal.  Eyes:     Conjunctiva/sclera: Conjunctivae normal.     Pupils: Pupils are equal, round, and reactive to light.  Neck:     Thyroid: No thyromegaly.     Vascular: No carotid bruit.  Cardiovascular:     Rate and Rhythm: Normal rate and regular rhythm.     Heart sounds: Normal heart sounds.  Pulmonary:     Effort: Pulmonary effort is normal.     Breath sounds: Normal breath sounds. No wheezing.  Chest:  Breasts:    Right: No mass.     Left: No mass.  Abdominal:     General: Bowel sounds are normal.     Palpations: Abdomen is soft.     Tenderness: There is no abdominal tenderness.  Musculoskeletal:     Cervical back: Normal range of motion and neck supple.     Lumbar back: Decreased range of motion.  Lymphadenopathy:     Cervical: No cervical adenopathy.  Skin:    General: Skin is warm and dry.  Neurological:     Mental Status: He is alert and oriented to person, place, and time.     Deep Tendon Reflexes: Reflexes are normal and symmetric.  Psychiatric:        Attention and Perception: Attention normal.        Mood and Affect: Mood normal.        Thought Content: Thought content normal.    Wt Readings from Last 3 Encounters:  05/10/21 186 lb (84.4 kg)  12/11/20 190 lb (86.2 kg)  10/10/20 195 lb (88.5 kg)    BP 122/78 (BP Location: Right Arm, Patient Position: Sitting, Cuff Size: Normal)   Pulse 96   Ht '5\' 10"'$  (1.778 m)   Wt 186 lb (84.4 kg)   SpO2 95%    BMI 26.69 kg/m   Assessment and Plan: 1. Annual physical exam Continue healthy diet, exercise  He declines Shingrix. Prevnar-20 due in 6 months - TSH  2. Gastroesophageal reflux disease, unspecified whether esophagitis present Symptoms  well controlled on daily PPI No red flag signs such as weight loss, n/v, melena Will continue omeprazole. - CBC with Differential/Platelet  3. Mixed hyperlipidemia Tolerating statin medication without side effects at this time LDL is at goal of < 70 on current dose Continue same therapy without change at this time. - Lipid panel - Comprehensive metabolic panel - rosuvastatin (CRESTOR) 20 MG tablet; Take 1 tablet (20 mg total) by mouth daily.  Dispense: 90 tablet; Refill: 1  4. COPD mixed type [facility]) Doing well without recent infection, worsening cough or SOB Continue Albuterol PRN  5. BPH with obstruction/lower urinary tract symptoms Followed by Urology for this and bladder cancer DRE deferred - PSA  6. Abdominal aortic atherosclerosis ([facility]) Continue statin. - rosuvastatin (CRESTOR) 20 MG tablet; Take 1 tablet (20 mg total) by mouth daily.  Dispense: 90 tablet; Refill: 1   Partially dictated using Editor, commissioning. Any errors are unintentional.  Halina Maidens, MD [facility]  05/10/2021

## 2021-05-10 ENCOUNTER — Ambulatory Visit (INDEPENDENT_AMBULATORY_CARE_PROVIDER_SITE_OTHER): Admitting: Internal Medicine

## 2021-05-10 ENCOUNTER — Other Ambulatory Visit: Payer: Self-pay

## 2021-05-10 ENCOUNTER — Encounter: Payer: Self-pay | Admitting: Internal Medicine

## 2021-05-10 VITALS — BP 122/78 | HR 96 | Ht 70.0 in | Wt 186.0 lb

## 2021-05-10 DIAGNOSIS — N401 Enlarged prostate with lower urinary tract symptoms: Secondary | ICD-10-CM

## 2021-05-10 DIAGNOSIS — E782 Mixed hyperlipidemia: Secondary | ICD-10-CM | POA: Diagnosis not present

## 2021-05-10 DIAGNOSIS — J449 Chronic obstructive pulmonary disease, unspecified: Secondary | ICD-10-CM

## 2021-05-10 DIAGNOSIS — Z0000 Encounter for general adult medical examination without abnormal findings: Secondary | ICD-10-CM

## 2021-05-10 DIAGNOSIS — I700 Atherosclerosis of aorta: Secondary | ICD-10-CM | POA: Diagnosis not present

## 2021-05-10 DIAGNOSIS — D485 Neoplasm of uncertain behavior of skin: Secondary | ICD-10-CM | POA: Diagnosis not present

## 2021-05-10 DIAGNOSIS — Z23 Encounter for immunization: Secondary | ICD-10-CM | POA: Diagnosis not present

## 2021-05-10 DIAGNOSIS — N138 Other obstructive and reflux uropathy: Secondary | ICD-10-CM

## 2021-05-10 DIAGNOSIS — K219 Gastro-esophageal reflux disease without esophagitis: Secondary | ICD-10-CM

## 2021-05-10 MED ORDER — ROSUVASTATIN CALCIUM 20 MG PO TABS: 20.0000 mg | ORAL_TABLET | Freq: Every day | ORAL | 1 refills | Status: DC

## 2021-05-11 LAB — COMPREHENSIVE METABOLIC PANEL
ALT: 19 IU/L (ref 0–44)
AST: 21 IU/L (ref 0–40)
Albumin/Globulin Ratio: 1.3 (ref 1.2–2.2)
Albumin: 4 g/dL (ref 3.8–4.8)
Alkaline Phosphatase: 96 IU/L (ref 44–121)
BUN/Creatinine Ratio: 18 (ref 10–24)
BUN: 16 mg/dL (ref 8–27)
Bilirubin Total: 0.3 mg/dL (ref 0.0–1.2)
CO2: 24 mmol/L (ref 20–29)
Calcium: 9.2 mg/dL (ref 8.6–10.2)
Chloride: 103 mmol/L (ref 96–106)
Creatinine, Ser: 0.9 mg/dL (ref 0.76–1.27)
Globulin, Total: 3.2 g/dL (ref 1.5–4.5)
Glucose: 98 mg/dL (ref 65–99)
Potassium: 4.9 mmol/L (ref 3.5–5.2)
Sodium: 140 mmol/L (ref 134–144)
Total Protein: 7.2 g/dL (ref 6.0–8.5)
eGFR: 95 mL/min/{1.73_m2} (ref >59)

## 2021-05-11 LAB — CBC WITH DIFFERENTIAL/PLATELET
Basophils Absolute: 0.1 10*3/uL (ref 0.0–0.2)
Basos: 1 %
EOS (ABSOLUTE): 0.5 10*3/uL — ABNORMAL HIGH (ref 0.0–0.4)
Eos: 5 %
Hematocrit: 40.6 % (ref 37.5–51.0)
Hemoglobin: 13 g/dL (ref 13.0–17.7)
Immature Grans (Abs): 0 10*3/uL (ref 0.0–0.1)
Immature Granulocytes: 0 %
Lymphocytes Absolute: 2.6 10*3/uL (ref 0.7–3.1)
Lymphs: 25 %
MCH: 26.3 pg — ABNORMAL LOW (ref 26.6–33.0)
MCHC: 32 g/dL (ref 31.5–35.7)
MCV: 82 fL (ref 79–97)
Monocytes Absolute: 0.8 10*3/uL (ref 0.1–0.9)
Monocytes: 8 %
Neutrophils Absolute: 6.2 10*3/uL (ref 1.4–7.0)
Neutrophils: 61 %
Platelets: 405 10*3/uL (ref 150–450)
RBC: 4.95 x10E6/uL (ref 4.14–5.80)
RDW: 14.5 % (ref 11.6–15.4)
WBC: 10.2 10*3/uL (ref 3.4–10.8)

## 2021-05-11 LAB — LIPID PANEL
Chol/HDL Ratio: 5 ratio (ref 0.0–5.0)
Cholesterol, Total: 141 mg/dL (ref 100–199)
HDL: 28 mg/dL — ABNORMAL LOW (ref >39)
LDL Chol Calc (NIH): 71 mg/dL (ref 0–99)
Triglycerides: 259 mg/dL — ABNORMAL HIGH (ref 0–149)
VLDL Cholesterol Cal: 42 mg/dL — ABNORMAL HIGH (ref 5–40)

## 2021-05-11 LAB — TSH: TSH: 2.63 u[IU]/mL (ref 0.450–4.500)

## 2021-05-11 LAB — PSA: Prostate Specific Ag, Serum: 0.7 ng/mL (ref 0.0–4.0)

## 2021-06-17 NOTE — Progress Notes (Signed)
06/18/2021  CC:  Chief Complaint  Patient presents with   Cysto    HPI: Nathan Anthony is a 65 y.o. male with a personal history of malignant neoplasm of lateral wall of urinary bladder and BPH with bladder outlet obstruction, who presents today for routine cystoscopy.   In 2019 he underwent a CT urogram which was suspicious of bladder tumor. 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   05/2019 cystoscopy revealed approx 2 mm lesion concerning for low-grade very small recurrence. He elected to undergo in office fulguration.    Cysto 09/06/19 showed a small area <1 mm at the dome of the bladder slightly posterior to the level in the midline which is slightly heaped up but no obvious papillary change.    His most recent PSA on 05/10/2021 was 0.7.   He is doing well today he reports hat he has a new relationship and has become more sexually active and he has seen blood in his ejaculate fluid. It is purple in color.   Continues to have obstructive urinary symptoms.  PSA trend:  Component Prostate Specific Ag, Serum  Latest Ref Rng & Units 0.0 - 4.0 ng/mL  04/22/2017 0.6  04/22/2018 0.7  04/27/2019 0.6  05/03/2020 0.7  05/10/2021 0.7    Vitals:   06/18/21 1054  BP: (!) 150/96  Pulse: 73  NED. A&Ox3.   No respiratory distress   Abd soft, NT, ND Normal phallus with bilateral descended testicles  Cystoscopy Procedure Note  Patient identification was confirmed, informed consent was obtained, and patient was prepped using Betadine solution.  Lidocaine jelly was administered per urethral meatus.     Pre-Procedure: - Inspection reveals a normal caliber ureteral meatus.  Procedure: The flexible cystoscope was introduced without difficulty - No urethral  strictures/lesions are present. - Enlarged prostate with median lobe - Normal bladder neck - Bilateral ureteral orifices identified - Bladder mucosa  reveals no ulcers, tumors, or lesions - No bladder stones - mild trabeculation  Retroflexion shows small median lobe    Post-Procedure: - Patient tolerated the procedure well  Assessment/ Plan:  History of malignant neoplasm of lateral wall of urinary bladder  - NED  - Follow-up in 6 months for repeat cystoscopy   BPH with bladder outlet obstruction  - Continue Flomax  - He is interested in outlet procedure like HoLEP vs. TURP  -provided with info about each of these procedure and will schedule f/u to discuss this specifically  3. Hematospermia  - I explained to the patient some of the conditions that may cause hematospermia, such as: disorders of the prostate gland, seminal vesicles, spermatic cord, and ejaculatory duct system; urogenital infections including sexually transmitted infections (eg, chlamydia, herpes simplex virus, gonorrhea, trichomonas); metastatic cancers; vascular malformations; congenital and drug-induced bleeding disorders; and even frequent daily ejaculation over a period of several weeks.  I reassured him that his exam was normal and that we may never discover a reason for his hematospermia and it is most likely a benign symptom.     - Likely secondary to enlarged prostate   Follow-up in 1 month for IPSS and PVR   I,Kailey Littlejohn,acting as a scribe for Hollice Espy, MD.,have documented all relevant documentation on the behalf of Hollice Espy, MD,as directed by  Hollice Espy, MD  while in the presence of Hollice Espy, MD. I have reviewed the above documentation for accuracy and completeness, and I agree with the above.   Hollice Espy, MD

## 2021-06-18 ENCOUNTER — Other Ambulatory Visit: Payer: Self-pay

## 2021-06-18 ENCOUNTER — Ambulatory Visit (INDEPENDENT_AMBULATORY_CARE_PROVIDER_SITE_OTHER): Admitting: Urology

## 2021-06-18 VITALS — BP 150/96 | HR 73 | Ht 70.0 in | Wt 186.0 lb

## 2021-06-18 DIAGNOSIS — N401 Enlarged prostate with lower urinary tract symptoms: Secondary | ICD-10-CM

## 2021-06-18 DIAGNOSIS — Z8551 Personal history of malignant neoplasm of bladder: Secondary | ICD-10-CM

## 2021-06-18 DIAGNOSIS — R361 Hematospermia: Secondary | ICD-10-CM

## 2021-06-18 DIAGNOSIS — N138 Other obstructive and reflux uropathy: Secondary | ICD-10-CM

## 2021-06-18 LAB — URINALYSIS, COMPLETE
Bilirubin, UA: NEGATIVE
Glucose, UA: NEGATIVE
Ketones, UA: NEGATIVE
Nitrite, UA: NEGATIVE
Protein,UA: NEGATIVE
RBC, UA: NEGATIVE
Specific Gravity, UA: <1.005 — ABNORMAL LOW (ref 1.005–1.030)
Urobilinogen, Ur: 0.2 mg/dL (ref 0.2–1.0)
pH, UA: 5.5 (ref 5.0–7.5)

## 2021-06-18 NOTE — Patient Instructions (Signed)
Transurethral Resection of the Prostate Transurethral resection of the prostate (TURP) is the removal (resection) of part of the gland that produces semen (prostate gland). This procedure is done to treat benign prostatic hyperplasia (BPH). BPH is an abnormal, noncancerous (benign) increase in the number of cells that make up the prostate tissue. BPH causes the prostate to get bigger. The enlarged prostate can push against or block the tube that drains urine from the bladder out of the body (urethra). BPH can affect normal urine flow by causing bladder infections, difficulty controlling bladder function, and difficulty emptying the bladder.  The goal of TURP is to remove enough prostate tissue to allow for a normal flow of urine. The procedure will allow you to empty your bladder more completely when you urinate so that you can urinate less often. In a transurethral resection, a thin telescope with a light, a tiny camera, and an electric cutting edge (resectoscope) is passed through the urethra and into the prostate. The opening of the urethra is at the end of the penis. Tell a health care provider about: Any allergies you have. All medicines you are taking, including vitamins, herbs, eye drops, creams, and over-the-counter medicines. Any problems you or family members have had with anesthetic medicines. Any blood disorders you have. Any surgeries you have had. Any medical conditions you have. Any prostate infections you have had. What are the risks? Generally, this is a safe procedure. However, problems may occur, including: Infection. Bleeding. Allergic reactions to medicines. Damage to other structures or organs, such as: The urethra. The bladder. Muscles that surround the prostate. Difficulty getting an erection. Inability to control when you urinate (incontinence). Scarring, which may cause problems with urine flow. What happens before the procedure? Medicines Ask your health care  provider about: Changing or stopping your regular medicines. This is especially important if you are taking diabetes medicines or blood thinners. Taking medicines such as aspirin and ibuprofen. These medicines can thin your blood. Do not take these medicines unless your health care provider tells you to take them. Taking over-the-counter medicines, vitamins, herbs, and supplements.  General instructions You may have a physical exam. You may have a blood or urine sample taken. Ask your health care provider what steps will be taken to help prevent infection. These may include: Washing skin with a germ-killing soap. Taking antibiotic medicine. Plan to have someone take you home from the hospital or clinic. You may not be able to drive for up to 10 days after your procedure. Plan to have a responsible adult care for you for at least 24 hours after you leave the hospital or clinic. This is important. What happens during the procedure?  An IV will be inserted into one of your veins. You will be given one or more of the following: A medicine to help you relax (sedative). A medicine to make you fall asleep (general anesthetic). A medicine that is injected into your spine to numb the area below and slightly above the injection site (spinal anesthetic). Your legs will be placed in foot rests (stirrups) so that your legs are apart and your knees are bent. The resectoscope will be passed through your urethra to your prostate. Parts of your prostate will be resected using the cutting edge of the resectoscope. The resectoscope will be removed. A small, thin tube (catheter) will be passed through your urethra and into your bladder. The catheter will drain urine into a bag outside of your body. Fluid may be passed through   the catheter to keep the catheter open. The procedure may vary among health care providers and hospitals. What happens after the procedure? Your blood pressure, heart rate, breathing  rate, and blood oxygen level will be monitored until you leave the hospital or clinic. You may continue to receive fluids and medicines through an IV. You may have some pain. Pain medicine will be available to help you. You will have a catheter draining your urine. You may have blood in your urine. Your catheter may be kept in until your urine is clear. Your urinary drainage will be monitored. If necessary, your bladder may be rinsed out (irrigated) through your catheter. You will be encouraged to walk around as soon as possible. You may have to wear compression stockings. These stockings help prevent blood clots and reduce swelling in your legs. Do not drive for 24 hours if you were given a sedative during your procedure. Summary Transurethral resection of the prostate (TURP) is the removal (resection) of part of the gland that produces semen (prostate gland). The goal of this procedure is to remove enough prostate tissue to allow for a normal flow of urine. Follow instructions from your health care provider about taking medicines and about eating and drinking before the procedure. This information is not intended to replace advice given to you by your health care provider. Make sure you discuss any questions you have with your health care provider. Document Revised: 12/08/2018 Document Reviewed: 05/19/2018 Elsevier Patient Education  2020 Elsevier Inc.    Holmium Laser Enucleation of the Prostate (HoLEP)  HoLEP is a treatment for men with benign prostatic hyperplasia (BPH). The laser surgery removed blockages of urine flow, and is done without any incisions on the body.     What is HoLEP?  HoLEP is a type of laser surgery used to treat obstruction (blockage) of urine flow as a result of benign prostatic hyperplasia (BPH). In men with BPH, the prostate gland is not cancerous, but has become enlarged. An enlarged prostate can result in a number of urinary tract symptoms such as weak urinary  stream, difficulty in starting urination, inability to urinate, frequent urination, or getting up at night to urinate.  HoLEP was developed in the 1990's as a more effective and less expensive surgical option for BPH, compared to other surgical options such as laser vaporization(PVP/greenlight laser), transurethral resection of the prostate(TURP), and open simple prostatectomy.   What happens during a HoLEP?  HoLEP requires general anesthesia ("asleep" throughout the procedure).   An antibiotic is given to reduce the risk of infection  A surgical instrument called a resectoscope is inserted through the urethra (the tube that carries urine from the bladder). The resectoscope has a camera that allows the surgeon to view the internal structure of the prostate gland, and to see where the incisions are being made during surgery.  The laser is inserted into the resectoscope and is used to enucleate (free up) the enlarged prostate tissue from the capsule (outer shell) and then to seal up any blood vessels. The tissue that has been removed is pushed back into the bladder.  A morcellator is placed through the resectoscope, and is used to suction out the prostate tissue that has been pushed into the bladder.  When the prostate tissue has been removed, the resectoscope is removed, and a foley catheter is placed to allow healing and drain the urine from the bladder.     What happens after a HoLEP?  More than 90% of patients   go home the same day a few hours after surgery. Less than 10% will be admitted to the hospital overnight for observation to monitor the urine, or if they have other medical problems.  Fluid is flushed through the catheter for about 1 hour after surgery to clear any blood from the urine. It is normal to have some blood in the urine after surgery. The need for blood transfusion is extremely rare.  Eating and drinking are permitted after the procedure once the patient has fully  awakened from anesthesia.  The catheter is usually removed 2-3 days after surgery- the patient will come to clinic to have the catheter removed and make sure they can urinate on their own.  It is very important to drink lots of fluids after surgery for one week to keep the bladder flushed.  At first, there may be some burning with urination, but this typically improved within a few hours to days. Most patients do not have a significant amount of pain, and narcotic pain medications are rarely needed.  Symptoms of urinary frequency, urgency, and even leakage are NORMAL for the first few weeks after surgery as the bladder adjusts after having to work hard against blockage from the prostate for many years. This will improve, but can sometimes take several months.  The use of pelvic floor exercises (Kegel exercises) can help improve problems with urinary incontinence.   After catheter removal, patients will be seen at 6 weeks and 6 months for symptom check  No heavy lifting for at least 2-3 weeks after surgery, however patients can walk and do light activities the first day after surgery. Return to work time depends on occupation.    What are the advantages of HoLEP?  HoLEP has been studied in many different parts of the world and has been shown to be a safe and effective procedure. Although there are many types of BPH surgeries available, HoLEP offers a unique advantage in being able to remove a large amount of tissue without any incisions on the body, even in very large prostates, while decreasing the risk of bleeding and providing tissue for pathology (to look for cancer). This decreases the need for blood transfusions during surgery, minimizes hospital stay, and reduces the risk of needing repeat treatment.  What are the side effects of HoLEP?  Temporary burning and bleeding during urination. Some blood may be seen in the urine for weeks after surgery and is part of the healing process.   Urinary incontinence (inability to control urine flow) is expected in all patients immediately after surgery and they should wear pads for the first few days/weeks. This typically improves over the course of several weeks. Performing Kegel exercises can help decrease leakage from stress maneuvers such as coughing, sneezing, or lifting. The rate of long term leakage is very low. Patients may also have leakage with urgency and this may be treated with medication. The risk of urge incontinence can be dependent on several factors including age, prostate size, symptoms, and other medical problems.  Retrograde ejaculation or "backwards ejaculation." In 75% of cases, the patient will not see any fluid during ejaculation after surgery.  Erectile function is generally not significantly affected.   What are the risks of HoLEP?  Injury to the urethra or development of scar tissue at a later date  Injury to the capsule of the prostate (typically treated with longer catheterization).  Injury to the bladder or ureteral orifices (where the urine from the kidney drains out)  Infection   of the bladder, testes, or kidneys  Return of urinary obstruction at a later date requiring another operation (<2%)  Need for blood transfusion or re-operation due to bleeding  Failure to relieve all symptoms and/or need for prolonged catheterization after surgery  5-15% of patients are found to have previously undiagnosed prostate cancer in their specimen. Prostate cancer can be treated after HoLEP.  Standard risks of anesthesia including blood clots, heart attacks, etc  When should I call my doctor?  Fever over 101.3 degrees  Inability to urinate, or large blood clots in the urine   

## 2021-07-21 ENCOUNTER — Other Ambulatory Visit: Payer: Self-pay | Admitting: Internal Medicine

## 2021-07-21 DIAGNOSIS — J4530 Mild persistent asthma, uncomplicated: Secondary | ICD-10-CM

## 2021-07-21 NOTE — Telephone Encounter (Signed)
Requested Prescriptions  Pending Prescriptions Disp Refills  . montelukast (SINGULAIR) 10 MG tablet [Pharmacy Med Name: MONTELUKAST SODIUM TABS 10MG ] 90 tablet 3    Sig: TAKE 1 TABLET DAILY     Pulmonology:  Leukotriene Inhibitors Passed - 07/21/2021 10:33 AM      Passed - Valid encounter within last 12 months    Recent Outpatient Visits          2 months ago Annual physical exam   [facility] [name], MD   1 year ago Annual physical exam   [facility] [name], MD   1 year ago Cellulitis of left lower extremity   [facility] [name], MD   2 years ago Annual physical exam   [facility] [name], MD   3 years ago Prostate cancer screening   [facility] [name], MD      Future Appointments            In 3 days Hollice Espy, MD [place]   In 4 months Ralene Bathe, MD [place]

## 2021-07-23 NOTE — Progress Notes (Signed)
07/26/21 9:16 AM   Nathan Anthony 1955-10-03 [id number]  Referring provider:  [name], MD [address] [place],  [place] [address] No chief complaint on file.   HPI: Nathan Anthony is a 65 y.o.male with a personal history of malignant neoplasm of lateral wall of urinary bladder and BPH with bladder outlet obstruction, who presents today for IPSS, PVR and discussion of treatment options.   In 2019 he underwent a CT urogram which was suspicious of bladder tumor. 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    05/2019 cystoscopy revealed approx 2 mm lesion concerning for low-grade very small recurrence. He elected to undergo in office fulguration.    Cysto 09/06/19 showed a small area <1 mm at the dome of the bladder slightly posterior to the level in the midline which is slightly heaped up but no obvious papillary change.   Most recent cystoscopy on 06/18/2021 revealed enlarged prostate with small median lobe.   He reports today the he has been experiencing difficulty starting his stream. He is taking his flomax and changed what time he takes it because if he takes it in the evening her wakes 2-3 times nightly. He has started taking it in the morning which increases his urgency during the day but decreases hi urination during the nighttime.   + median lobe on cysto   IPSS     Row Name 07/24/21 1100         International Prostate Symptom Score   How often have you had the sensation of not emptying your bladder? Almost always     How often have you had to urinate less than every two hours? More than half the time     How often have you found you stopped and started again several times when you urinated? More than half the time     How  often have you found it difficult to postpone urination? About half the time     How often have you had a weak urinary stream? More than half the time     How often have you had to strain to start urination? About half the time     How many times did you typically get up at night to urinate? 2 Times     Total IPSS Score 25       Quality of Life due to urinary symptoms   If you were to spend the rest of your life with your urinary condition just the way it is now how would you feel about that? 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[place])    Cancer (HCC)    bladder   Chronic obstructive pulmonary disease ([place]) 02/19/3085   Complication of anesthesia    Asthmatic episode when awakening from colonoscopy   DISH (diffuse idiopathic skeletal hyperostosis)    neck pain   GERD (gastroesophageal reflux disease)    Hyperlipidemia    Hyperlipidemia    Inguinal hernia    Left Side   Nasal congestion    nasal polyps  Osteopenia    Osteoporosis    RA (rheumatoid arthritis) (HCC)    shoulders and hands   Rheumatoid arthritis (HCC)    Sciatica    Sciatica    Sciatica    Tinnitus aurium, left     Surgical History: Past Surgical History:  Procedure Laterality Date   BASAL CELL CARCINOMA EXCISION     COLONOSCOPY  06/06/2010   COLONOSCOPY WITH PROPOFOL N/A 06/08/2020   Procedure: COLONOSCOPY WITH PROPOFOL;  Surgeon: Lucilla Lame, MD;  Location: [facility];  Service: Endoscopy;  Laterality: N/A;  priority 4   ETHMOIDECTOMY Bilateral 07/03/2020   Procedure: ETHMOIDECTOMY;  Surgeon: Clyde Canterbury, MD;  Location: [facility];  Service: ENT;  Laterality: Bilateral;   FRONTAL SINUS EXPLORATION Bilateral 07/03/2020   Procedure: FRONTAL SINUS EXPLORATION;  Surgeon: Clyde Canterbury, MD;  Location: [facility];  Service: ENT;   Laterality: Bilateral;   HERNIA REPAIR     IMAGE GUIDED SINUS SURGERY N/A 07/03/2020   Procedure: IMAGE GUIDED SINUS SURGERY;  Surgeon: Clyde Canterbury, MD;  Location: [facility];  Service: ENT;  Laterality: N/A;  need [employer] disk PLACE DISK ON OR CHARGE NURSE DESK 10-01 KP Another disk on OR charge nurse desk 10.14.21 DS   MAXILLARY ANTROSTOMY Bilateral 07/03/2020   Procedure: MAXILLARY ANTROSTOMY with tissue removal;  Surgeon: Clyde Canterbury, MD;  Location: [facility];  Service: ENT;  Laterality: Bilateral;   NASAL SEPTOPLASTY W/ TURBINOPLASTY Bilateral 07/03/2020   Procedure: NASAL SEPTOPLASTY;  Surgeon: Clyde Canterbury, MD;  Location: [facility];  Service: ENT;  Laterality: Bilateral;   SPHENOIDECTOMY Bilateral 07/03/2020   Procedure: Coralee Pesa;  Surgeon: Clyde Canterbury, MD;  Location: [facility];  Service: ENT;  Laterality: Bilateral;   TRANSURETHRAL RESECTION OF BLADDER TUMOR WITH MITOMYCIN-C N/A 06/21/2018   Procedure: TRANSURETHRAL RESECTION OF BLADDER TUMOR WITH Gemcitabine;  Surgeon: Hollice Espy, MD;  Location: [facility];  Service: Urology;  Laterality: N/A;   VARICOCELECTOMY     VARICOCELECTOMY      Home Medications:  Allergies as of 07/24/2021       Reactions   Pravastatin    Other reaction(s): Nausea Only   Simvastatin    Other reaction(s): Nausea Only        Medication List        Accurate as of July 24, 2021 11:59 PM. If you have any questions, ask your nurse or doctor.          Adalimumab 40 MG/0.8ML Pnkt Inject 40 mg into the skin every 14 (fourteen) days.   alendronate 70 MG tablet Commonly known as: FOSAMAX Take 70 mg by mouth once a week.   calcium-vitamin D 250-100 MG-UNIT tablet Take 1 tablet by mouth 2 (two) times daily.   fluocinonide cream 0.05 % Commonly known as: LIDEX Apply topically 2 (two) times daily.   loratadine 10 MG tablet Commonly known as: CLARITIN Take 10 mg by mouth daily.    meloxicam 15 MG tablet Commonly known as: MOBIC Take 15 mg by mouth daily.   montelukast 10 MG tablet Commonly known as: SINGULAIR TAKE 1 TABLET DAILY   multivitamin capsule Take 1 capsule by mouth daily.   omeprazole 20 MG capsule Commonly known as: PRILOSEC TAKE 1 CAPSULE DAILY   ProAir HFA 108 (90 Base) MCG/ACT inhaler Generic drug: albuterol USE 1 INHALATION EVERY 6 HOURS AS NEEDED FOR WHEEZING OR SHORTNESS OF BREATH (NEEDS APPOINTMENT FOR FURTHER REFILLS)   rosuvastatin 20 MG tablet Commonly known as: CRESTOR Take  1 tablet (20 mg total) by mouth daily.   tamsulosin 0.4 MG Caps capsule Commonly known as: FLOMAX TAKE 1 CAPSULE DAILY   triamcinolone 55 MCG/ACT Aero nasal inhaler Commonly known as: NASACORT Place 2 sprays into the nose daily.        Allergies:  Allergies  Allergen Reactions   Pravastatin     Other reaction(s): Nausea Only   Simvastatin     Other reaction(s): Nausea Only    Family History: Family History  Problem Relation Age of Onset   Heart disease Mother    Asthma Mother    Heart disease Brother    Ankylosing spondylitis Brother     Social History:  reports that he quit smoking about 3 years ago. His smoking use included pipe. He has never used smokeless tobacco. He reports current alcohol use. He reports that he does not use drugs.   Physical Exam: BP (!) 145/90   Pulse 74   Ht 5\' 10"  (1.778 m)   Wt 186 lb (84.4 kg)   BMI 26.69 kg/m   Constitutional:  Alert and oriented, No acute distress. HEENT: [place] AT, moist mucus membranes.  Trachea midline, no masses. Cardiovascular: No clubbing, cyanosis, or edema. Respiratory: Normal respiratory effort, no increased work of breathing. Skin: No rashes, bruises or suspicious lesions. Neurologic: Grossly intact, no focal deficits, moving all 4 extremities. Psychiatric: Normal mood and affect.  Laboratory Data: Lab Results  Component Value Date   CREATININE 0.90 05/10/2021     Pertinent Imaging:  PVR 150 mL   Assessment & Plan:   BPH with bladder outlet obstruction  - Continue Flomax  - Bladder scan post void residual; 150 ml today  - Discussed HoLEP versus TURP (median lobe) - We discussed alternatives including TURP vs. holmium laser enucleation of the prostate. Differences between the surgical procedures were discussed as well as the risks and benefits of each.  He is most interested in HoLEP.  We discussed the common postoperative course following holep including need for overnight Foley catheter, temporary worsening of irritative voiding symptoms, and occasional stress incontinence which typically lasts up to 6 months but can persist.  We discussed retrograde ejaculation and damage to surrounding structures including the urinary sphincter. Other uncommon complications including hematuria and urinary tract infection.  He understands all of the above and is willing to proceed as planned.  History of malignant neoplasm of lateral wall of urinary bladder  - NED    - Return for surveillance cystoscopy   Conley Rolls as a scribe for Hollice Espy, MD.,have documented all relevant documentation on the behalf of Hollice Espy, MD,as directed by  Hollice Espy, MD while in the presence of Hollice Espy, MD.  I have reviewed the above documentation for accuracy and completeness, and I agree with the above.   Hollice Espy, MD   [facility] [address] [place], [place] [address] [phone]

## 2021-07-24 ENCOUNTER — Encounter: Payer: Self-pay | Admitting: Urology

## 2021-07-24 ENCOUNTER — Other Ambulatory Visit: Payer: Self-pay

## 2021-07-24 ENCOUNTER — Ambulatory Visit (INDEPENDENT_AMBULATORY_CARE_PROVIDER_SITE_OTHER): Admitting: Urology

## 2021-07-24 VITALS — BP 145/90 | HR 74 | Ht 70.0 in | Wt 186.0 lb

## 2021-07-24 DIAGNOSIS — Z8551 Personal history of malignant neoplasm of bladder: Secondary | ICD-10-CM | POA: Diagnosis not present

## 2021-07-26 ENCOUNTER — Other Ambulatory Visit: Payer: Self-pay | Admitting: Urology

## 2021-07-26 DIAGNOSIS — N138 Other obstructive and reflux uropathy: Secondary | ICD-10-CM

## 2021-07-26 NOTE — Progress Notes (Signed)
Surgical Physician Order Form  * Scheduling expectation : Next Available  *Length of Case:   *Clearance needed: no  *Anticoagulation Instructions: Hold all anticoagulants  *Aspirin Instructions: Hold Aspirin  *Post-op visit Date/Instructions:  2 days post op foley removal, 6 weeks IPSS/ PVR  *Diagnosis: BPH w/BOO  *Procedure: HOLEP (16429)  -Admit type: OUTpatient  -Anesthesia: General  -VTE Prophylaxis Standing Order SCD's       Other:   -Standing Lab Orders Per Anesthesia    Lab other: UA&Urine Culture  -Standing Test orders EKG/Chest x-ray per Anesthesia       Test other:   - Medications:     Ancef 2gm IV   Other Instructions:  Prefers to wait until after new year

## 2021-07-29 NOTE — Progress Notes (Signed)
[facility] Posting Form   Surgery Date/Time: Date: 09/23/2021  Surgeon: Dr. Hollice Espy, MD  Surgery Location: Day Surgery  Inpt ( No  )   Outpt (Yes)   Obs ( No  )   Diagnosis: BPH with BOO N40.1; N13.8  -CPT: 88416  Surgery: HOLEP  Stop Anticoagulations: Yes  Cardiac/Medical/Pulmonary Clearance needed: no   *Orders entered into EPIC  Date: 07/29/21   *Case booked in [place]  Date: 07/29/21  *Notified pt of Surgery: Date: 07/29/21  PRE-OP UA & CX: Yes, will obtain on 09/12/2021  *Placed into Prior Authorization Work Fabio Bering Date: 07/29/21   Assistant/laser/rep:No

## 2021-08-29 ENCOUNTER — Other Ambulatory Visit: Payer: Self-pay | Admitting: Internal Medicine

## 2021-08-29 DIAGNOSIS — K219 Gastro-esophageal reflux disease without esophagitis: Secondary | ICD-10-CM

## 2021-08-29 NOTE — Telephone Encounter (Signed)
Requested Prescriptions  Pending Prescriptions Disp Refills   omeprazole (PRILOSEC) 20 MG capsule [Pharmacy Med Name: OMEPRAZOLE DR CAPS 20MG ] 90 capsule 3    Sig: TAKE 1 CAPSULE DAILY     Gastroenterology: Proton Pump Inhibitors Passed - 08/29/2021  9:07 AM      Passed - Valid encounter within last 12 months    Recent Outpatient Visits          3 months ago Annual physical exam   [facility] [name], MD   1 year ago Annual physical exam   [facility] [name], MD   1 year ago Cellulitis of left lower extremity   [facility] [name], MD   2 years ago Annual physical exam   [facility] [name], MD   3 years ago Prostate cancer screening   [facility] [name], MD      Future Appointments            In 3 weeks [place], Gordan Payment [place]   In 2 months Hollice Espy, MD [address]   In 2 months Ralene Bathe, MD [place]

## 2021-09-09 ENCOUNTER — Other Ambulatory Visit: Payer: Self-pay

## 2021-09-09 ENCOUNTER — Other Ambulatory Visit
Admission: RE | Admit: 2021-09-09 | Discharge: 2021-09-09 | Disposition: A | Discharge status: Home / Self Care | Source: Ambulatory Visit | Attending: Urology | Admitting: Urology

## 2021-09-09 DIAGNOSIS — J449 Chronic obstructive pulmonary disease, unspecified: Secondary | ICD-10-CM

## 2021-09-09 NOTE — Patient Instructions (Addendum)
Your procedure is scheduled on: 09/23/21 - Monday Report to the Registration Desk on the 1st floor of the [facility]. To find out your arrival time, please call [phone] between 1PM - 3PM on: 09/20/21- Friday Report to [facility] for Labs/EKG on 09/12/21 at 1:30 pm.  REMEMBER: Instructions that are not followed completely may result in serious medical risk, up to and including death; or upon the discretion of your surgeon and anesthesiologist your surgery may need to be rescheduled.  Do not eat food or drink any fluids after midnight the night before surgery.  No gum chewing, lozengers or hard candies.  TAKE THESE MEDICATIONS THE MORNING OF SURGERY WITH A SIP OF WATER:   - omeprazole (PRILOSEC) 20 MG capsule, (take one the night before and one on the morning of surgery - helps to prevent nausea after surgery.) - tamsulosin (FLOMAX) 0.4 MG CAPS capsule  Use PROAIR HFA 108 (90 Base) MCG/ACT inhalers on the day of surgery and bring to the hospital.  Stop taking one week prior to surgery: meloxicam (MOBIC) 15 MG tablet Stop Anti-inflammatories (NSAIDS) such as Advil, Aleve, Ibuprofen, Motrin, Naproxen, Naprosyn and Aspirin based products such as Excedrin, Goodys Powder, BC Powder.   Stop ANY OVER THE COUNTER supplements until after surgery.  You may take Tylenol if needed for pain up until the day of surgery.  No Alcohol for 24 hours before or after surgery.  No Smoking including e-cigarettes for 24 hours prior to surgery.  No chewable tobacco products for at least 6 hours prior to surgery.  No nicotine patches on the day of surgery.  Do not use any "recreational" drugs for at least a week prior to your surgery.  Please be advised that the combination of cocaine and anesthesia may have negative outcomes, up to and including death. If you test positive for cocaine, your surgery will be cancelled.  On the morning of surgery brush your teeth with toothpaste and water, you  may rinse your mouth with mouthwash if you wish. Do not swallow any toothpaste or mouthwash.  Do not wear jewelry, make-up, hairpins, clips or nail polish.  Do not wear lotions, powders, or perfumes.   Do not shave body from the neck down 48 hours prior to surgery just in case you cut yourself which could leave a site for infection.  Also, freshly shaved skin may become irritated if using the CHG soap.  Contact lenses, hearing aids and dentures may not be worn into surgery.  Do not bring valuables to the hospital. [facility] is not responsible for any missing/lost belongings or valuables.   Notify your doctor if there is any change in your medical condition (cold, fever, infection).  Wear comfortable clothing (specific to your surgery type) to the hospital.  After surgery, you can help prevent lung complications by doing breathing exercises.  Take deep breaths and cough every 1-2 hours. Your doctor may order a device called an Incentive Spirometer to help you take deep breaths. When coughing or sneezing, hold a pillow firmly against your incision with both hands. This is called splinting. Doing this helps protect your incision. It also decreases belly discomfort.  If you are being admitted to the hospital overnight, leave your suitcase in the car. After surgery it may be brought to your room.  If you are being discharged the day of surgery, you will not be allowed to drive home. You will need a responsible adult (18 years or older) to drive you  home and stay with you that night.   If you are taking public transportation, you will need to have a responsible adult (18 years or older) with you. Please confirm with your physician that it is acceptable to use public transportation.   Please call the [facility] at [phone] if you have any questions about these instructions.  Surgery Visitation Policy:  Patients undergoing a surgery or procedure may have one  family member or support person with them as long as that person is not COVID-19 positive or experiencing its symptoms.  That person may remain in the waiting area during the procedure and may rotate out with other people.  Inpatient Visitation:    Visiting hours are 7 a.m. to 8 p.m. Up to two visitors ages 16+ are allowed at one time in a patient room. The visitors may rotate out with other people during the day. Visitors must check out when they leave, or other visitors will not be allowed. One designated support person may remain overnight. The visitor must pass COVID-19 screenings, use hand sanitizer when entering and exiting the patients room and wear a mask at all times, including in the patients room. Patients must also wear a mask when staff or their visitor are in the room. Masking is required regardless of vaccination status.

## 2021-09-12 ENCOUNTER — Other Ambulatory Visit: Payer: Self-pay

## 2021-09-12 ENCOUNTER — Other Ambulatory Visit

## 2021-09-12 ENCOUNTER — Other Ambulatory Visit
Admission: RE | Admit: 2021-09-12 | Discharge: 2021-09-12 | Disposition: A | Discharge status: Home / Self Care | Source: Ambulatory Visit | Attending: Urology | Admitting: Urology

## 2021-09-12 DIAGNOSIS — J449 Chronic obstructive pulmonary disease, unspecified: Secondary | ICD-10-CM | POA: Diagnosis not present

## 2021-09-12 DIAGNOSIS — Z01818 Encounter for other preprocedural examination: Secondary | ICD-10-CM | POA: Diagnosis present

## 2021-09-12 DIAGNOSIS — N138 Other obstructive and reflux uropathy: Secondary | ICD-10-CM

## 2021-09-12 LAB — BASIC METABOLIC PANEL
Anion gap: 9 (ref 5–15)
BUN: 12 mg/dL (ref 8–23)
CO2: 28 mmol/L (ref 22–32)
Calcium: 9.2 mg/dL (ref 8.9–10.3)
Chloride: 103 mmol/L (ref 98–111)
Creatinine, Ser: 0.75 mg/dL (ref 0.61–1.24)
GFR, Estimated: >60 mL/min (ref >60)
Glucose, Bld: 87 mg/dL (ref 70–99)
Potassium: 4.2 mmol/L (ref 3.5–5.1)
Sodium: 140 mmol/L (ref 135–145)

## 2021-09-12 LAB — CBC
HCT: 42.8 % (ref 39.0–52.0)
Hemoglobin: 13.2 g/dL (ref 13.0–17.0)
MCH: 25.6 pg — ABNORMAL LOW (ref 26.0–34.0)
MCHC: 30.8 g/dL (ref 30.0–36.0)
MCV: 82.9 fL (ref 80.0–100.0)
Platelets: 397 10*3/uL (ref 150–400)
RBC: 5.16 MIL/uL (ref 4.22–5.81)
RDW: 14.2 % (ref 11.5–15.5)
WBC: 10.6 10*3/uL — ABNORMAL HIGH (ref 4.0–10.5)
nRBC: 0 % (ref 0.0–0.2)

## 2021-09-13 LAB — URINALYSIS, COMPLETE
Bilirubin, UA: NEGATIVE
Glucose, UA: NEGATIVE
Ketones, UA: NEGATIVE
Nitrite, UA: NEGATIVE
Protein,UA: NEGATIVE
RBC, UA: NEGATIVE
Specific Gravity, UA: 1.01 (ref 1.005–1.030)
Urobilinogen, Ur: 0.2 mg/dL (ref 0.2–1.0)
pH, UA: 7 (ref 5.0–7.5)

## 2021-09-13 LAB — MICROSCOPIC EXAMINATION
Bacteria, UA: NONE SEEN
Epithelial Cells (non renal): NONE SEEN /hpf (ref 0–10)
RBC, Urine: NONE SEEN /hpf (ref 0–2)

## 2021-09-17 LAB — CULTURE, URINE COMPREHENSIVE

## 2021-09-23 ENCOUNTER — Encounter
Admission: RE | Disposition: A | Discharge status: Home / Self Care | Payer: Self-pay | Source: Ambulatory Visit | Attending: Urology

## 2021-09-23 ENCOUNTER — Encounter: Payer: Self-pay | Admitting: Urology

## 2021-09-23 ENCOUNTER — Ambulatory Visit
Admission: RE | Admit: 2021-09-23 | Discharge: 2021-09-23 | Disposition: A | Discharge status: Home / Self Care | Source: Ambulatory Visit | Attending: Urology | Admitting: Urology

## 2021-09-23 ENCOUNTER — Ambulatory Visit: Admitting: Anesthesiology

## 2021-09-23 ENCOUNTER — Other Ambulatory Visit: Payer: Self-pay

## 2021-09-23 DIAGNOSIS — N401 Enlarged prostate with lower urinary tract symptoms: Secondary | ICD-10-CM | POA: Insufficient documentation

## 2021-09-23 DIAGNOSIS — N138 Other obstructive and reflux uropathy: Secondary | ICD-10-CM | POA: Diagnosis not present

## 2021-09-23 DIAGNOSIS — E785 Hyperlipidemia, unspecified: Secondary | ICD-10-CM | POA: Diagnosis not present

## 2021-09-23 DIAGNOSIS — Z8551 Personal history of malignant neoplasm of bladder: Secondary | ICD-10-CM | POA: Diagnosis not present

## 2021-09-23 DIAGNOSIS — J449 Chronic obstructive pulmonary disease, unspecified: Secondary | ICD-10-CM | POA: Insufficient documentation

## 2021-09-23 DIAGNOSIS — Z87891 Personal history of nicotine dependence: Secondary | ICD-10-CM | POA: Insufficient documentation

## 2021-09-23 DIAGNOSIS — K219 Gastro-esophageal reflux disease without esophagitis: Secondary | ICD-10-CM | POA: Diagnosis not present

## 2021-09-23 DIAGNOSIS — N320 Bladder-neck obstruction: Secondary | ICD-10-CM | POA: Insufficient documentation

## 2021-09-23 HISTORY — PX: HOLEP-LASER ENUCLEATION OF THE PROSTATE WITH MORCELLATION: SHX6641

## 2021-09-23 SURGERY — ENUCLEATION, PROSTATE, USING LASER, WITH MORCELLATION
Anesthesia: General

## 2021-09-23 MED ORDER — LACTATED RINGERS IV SOLN: INTRAVENOUS | Status: DC

## 2021-09-23 MED ORDER — OXYBUTYNIN CHLORIDE 5 MG PO TABS: 5.0000 mg | ORAL_TABLET | Freq: Three times a day (TID) | ORAL | Status: DC | PRN

## 2021-09-23 MED ORDER — FENTANYL CITRATE (PF) 100 MCG/2ML IJ SOLN: 25.0000 ug | INTRAMUSCULAR | Status: DC | PRN

## 2021-09-23 MED ORDER — ORAL CARE MOUTH RINSE: 15.0000 mL | Freq: Once | OROMUCOSAL | Status: AC

## 2021-09-23 MED ORDER — FENTANYL CITRATE (PF) 100 MCG/2ML IJ SOLN
INTRAMUSCULAR | Status: AC
  Filled 2021-09-23: qty 2

## 2021-09-23 MED ORDER — CEFAZOLIN SODIUM-DEXTROSE 2-4 GM/100ML-% IV SOLN
2.0000 g | INTRAVENOUS | Status: AC
  Administered 2021-09-23: 2 g via INTRAVENOUS

## 2021-09-23 MED ORDER — FUROSEMIDE 10 MG/ML IJ SOLN
INTRAMUSCULAR | Status: DC | PRN
  Administered 2021-09-23: 10 mg via INTRAMUSCULAR

## 2021-09-23 MED ORDER — ROCURONIUM BROMIDE 100 MG/10ML IV SOLN
INTRAVENOUS | Status: DC | PRN
  Administered 2021-09-23: 30 mg via INTRAVENOUS
  Administered 2021-09-23: 10 mg via INTRAVENOUS

## 2021-09-23 MED ORDER — OXYBUTYNIN CHLORIDE 5 MG PO TABS
ORAL_TABLET | ORAL | Status: AC
  Administered 2021-09-23: 5 mg via ORAL
  Filled 2021-09-23: qty 1

## 2021-09-23 MED ORDER — PROPOFOL 10 MG/ML IV BOLUS
INTRAVENOUS | Status: AC
  Filled 2021-09-23: qty 20

## 2021-09-23 MED ORDER — PROPOFOL 10 MG/ML IV BOLUS
INTRAVENOUS | Status: DC | PRN
  Administered 2021-09-23: 140 mg via INTRAVENOUS

## 2021-09-23 MED ORDER — SODIUM CHLORIDE 0.9 % IR SOLN
Status: DC | PRN
  Administered 2021-09-23: 9000 mL

## 2021-09-23 MED ORDER — CHLORHEXIDINE GLUCONATE 0.12 % MT SOLN
OROMUCOSAL | Status: AC
  Administered 2021-09-23: 15 mL via OROMUCOSAL
  Filled 2021-09-23: qty 15

## 2021-09-23 MED ORDER — SUCCINYLCHOLINE CHLORIDE 200 MG/10ML IV SOSY
PREFILLED_SYRINGE | INTRAVENOUS | Status: DC | PRN
  Administered 2021-09-23: 90 mg via INTRAVENOUS

## 2021-09-23 MED ORDER — HYDROCODONE-ACETAMINOPHEN 5-325 MG PO TABS
1.0000 | ORAL_TABLET | Freq: Four times a day (QID) | ORAL | 0 refills | Status: DC | PRN
Start: 2021-09-23 — End: 2021-10-30

## 2021-09-23 MED ORDER — PROMETHAZINE HCL 25 MG/ML IJ SOLN: 6.2500 mg | INTRAMUSCULAR | Status: DC | PRN

## 2021-09-23 MED ORDER — MIDAZOLAM HCL 2 MG/2ML IJ SOLN
INTRAMUSCULAR | Status: AC
  Filled 2021-09-23: qty 2

## 2021-09-23 MED ORDER — OXYCODONE HCL 5 MG PO TABS: 5.0000 mg | ORAL_TABLET | Freq: Once | ORAL | Status: DC | PRN

## 2021-09-23 MED ORDER — OXYBUTYNIN CHLORIDE 5 MG PO TABS: 5.0000 mg | ORAL_TABLET | Freq: Three times a day (TID) | ORAL | 0 refills | Status: DC | PRN

## 2021-09-23 MED ORDER — CEFAZOLIN SODIUM-DEXTROSE 2-4 GM/100ML-% IV SOLN
INTRAVENOUS | Status: AC
  Filled 2021-09-23: qty 100

## 2021-09-23 MED ORDER — LIDOCAINE HCL (CARDIAC) PF 100 MG/5ML IV SOSY
PREFILLED_SYRINGE | INTRAVENOUS | Status: DC | PRN
  Administered 2021-09-23: 80 mg via INTRAVENOUS

## 2021-09-23 MED ORDER — CHLORHEXIDINE GLUCONATE 0.12 % MT SOLN: 15.0000 mL | Freq: Once | OROMUCOSAL | Status: AC

## 2021-09-23 MED ORDER — ACETAMINOPHEN 10 MG/ML IV SOLN: 1000.0000 mg | Freq: Once | INTRAVENOUS | Status: DC | PRN

## 2021-09-23 MED ORDER — OXYCODONE HCL 5 MG/5ML PO SOLN: 5.0000 mg | Freq: Once | ORAL | Status: DC | PRN

## 2021-09-23 MED ORDER — SUGAMMADEX SODIUM 200 MG/2ML IV SOLN
INTRAVENOUS | Status: DC | PRN
  Administered 2021-09-23: 200 mg via INTRAVENOUS

## 2021-09-23 MED ORDER — FENTANYL CITRATE (PF) 100 MCG/2ML IJ SOLN
INTRAMUSCULAR | Status: DC | PRN
  Administered 2021-09-23: 50 ug via INTRAVENOUS
  Administered 2021-09-23 (×2): 25 ug via INTRAVENOUS

## 2021-09-23 SURGICAL SUPPLY — 34 items
ADAPTER IRRIG TUBE 2 SPIKE SOL (ADAPTER) ×6 IMPLANT
BAG URINE DRAIN 2000ML AR STRL (UROLOGICAL SUPPLIES) ×2 IMPLANT
BAG URO DRAIN 4000ML (MISCELLANEOUS) IMPLANT
CATH FOL 2WAY LX 20X30 (CATHETERS) IMPLANT
CATH FOL 2WAY LX 22X30 (CATHETERS) IMPLANT
CATH FOLEY 3WAY 30CC 22FR (CATHETERS) IMPLANT
CATH URETL OPEN 5X70 (CATHETERS) ×3 IMPLANT
CONTAINER COLLECT MORCELLATR (MISCELLANEOUS) ×1 IMPLANT
DRAPE 3/4 80X56 (DRAPES) ×3 IMPLANT
DRAPE UTILITY 15X26 TOWEL STRL (DRAPES) IMPLANT
FIBER LASER FLEXIVA PULSE 550 (Laser) ×1 IMPLANT
FIBER LASER MOSES 550 DFL (Laser) ×2 IMPLANT
FILTER OVERFLOW MORCELLATOR (FILTER) ×1 IMPLANT
GAUZE 4X4 16PLY ~~LOC~~+RFID DBL (SPONGE) ×4 IMPLANT
GLOVE SURG ENC MOIS LTX SZ6.5 (GLOVE) ×10 IMPLANT
GOWN STRL REUS W/ TWL LRG LVL3 (GOWN DISPOSABLE) ×2 IMPLANT
GOWN STRL REUS W/TWL LRG LVL3 (GOWN DISPOSABLE) ×6
HOLDER FOLEY CATH W/STRAP (MISCELLANEOUS) ×3 IMPLANT
IV NS IRRIG 3000ML ARTHROMATIC (IV SOLUTION) ×16 IMPLANT
KIT TURNOVER CYSTO (KITS) ×3 IMPLANT
MBRN O SEALING YLW 17 FOR INST (MISCELLANEOUS) ×3
MEMBRANE SLNG YLW 17 FOR INST (MISCELLANEOUS) ×1 IMPLANT
MORCELLATOR COLLECT CONTAINER (MISCELLANEOUS) ×3
MORCELLATOR OVERFLOW FILTER (FILTER) ×3
MORCELLATOR ROTATION 4.75 335 (MISCELLANEOUS) ×3 IMPLANT
PACK CYSTO AR (MISCELLANEOUS) ×3 IMPLANT
SET CYSTO W/LG BORE CLAMP LF (SET/KITS/TRAYS/PACK) IMPLANT
SET IRRIG Y TYPE TUR BLADDER L (SET/KITS/TRAYS/PACK) ×3 IMPLANT
SLEEVE PROTECTION STRL DISP (MISCELLANEOUS) ×6 IMPLANT
SYR TOOMEY IRRIG 70ML (MISCELLANEOUS) ×3
SYRINGE TOOMEY IRRIG 70ML (MISCELLANEOUS) ×1 IMPLANT
TUBE PUMP MORCELLATOR PIRANHA (TUBING) ×3 IMPLANT
WATER STERILE IRR 1000ML POUR (IV SOLUTION) ×3 IMPLANT
WATER STERILE IRR 500ML POUR (IV SOLUTION) ×3 IMPLANT

## 2021-09-23 NOTE — Progress Notes (Signed)
°   09/23/21 0655  Clinical Encounter Type  Visited With Patient  Visit Type Pre-op   Chaplain facilitated reflective listening and conversation with pre procedure prayer

## 2021-09-23 NOTE — Anesthesia Preprocedure Evaluation (Addendum)
Anesthesia Evaluation  Patient identified by MRN, date of birth, ID band Patient awake    Reviewed: Allergy & Precautions, H&P , NPO status , Patient's Chart, lab work & pertinent test results  Airway Mallampati: II  TM Distance: >3 FB Neck ROM: full    Dental no notable dental hx.    Pulmonary asthma , COPD,  COPD inhaler, former smoker,    Pulmonary exam normal breath sounds clear to auscultation       Cardiovascular Exercise Tolerance: Good Normal cardiovascular exam Rhythm:regular Rate:Normal     Neuro/Psych    GI/Hepatic GERD  ,  Endo/Other    Renal/GU      Musculoskeletal  (+) Arthritis , Osteoarthritis,    Abdominal Normal abdominal exam  (+)   Peds  Hematology   Anesthesia Other Findings   Reproductive/Obstetrics                           Anesthesia Physical  Anesthesia Plan  ASA: 2  Anesthesia Plan: General ETT   Post-op Pain Management:    Induction: Intravenous  PONV Risk Score and Plan: 2 and Treatment may vary due to age or medical condition, Ondansetron and Dexamethasone  Airway Management Planned: Oral ETT  Additional Equipment:   Intra-op Plan:   Post-operative Plan:   Informed Consent: I have reviewed the patients History and Physical, chart, labs and discussed the procedure including the risks, benefits and alternatives for the proposed anesthesia with the patient or authorized representative who has indicated his/her understanding and acceptance.     Dental Advisory Given  Plan Discussed with: CRNA and Anesthesiologist  Anesthesia Plan Comments:       Anesthesia Quick Evaluation

## 2021-09-23 NOTE — Transfer of Care (Signed)
Immediate Anesthesia Transfer of Care Note  Patient: Nathan Anthony  Procedure(s) Performed: HOLEP-LASER ENUCLEATION OF THE PROSTATE WITH MORCELLATION  Patient Location: PACU  Anesthesia Type:General  Level of Consciousness: awake and alert   Airway & Oxygen Therapy: Patient Spontanous Breathing  Post-op Assessment: Report given to RN  Post vital signs: Reviewed and stable  Last Vitals:  Vitals Value Taken Time  BP 137/104 09/23/21 0912  Temp    Pulse 69 09/23/21 0913  Resp 17 09/23/21 0913  SpO2 96 % 09/23/21 0913  Vitals shown include unvalidated device data.  Last Pain:  Vitals:   09/23/21 0711  TempSrc: Temporal  PainSc: 5          Complications: No notable events documented.

## 2021-09-23 NOTE — Discharge Instructions (Addendum)
Holmium Laser Enucleation of the Prostate (HoLEP)  HoLEP is a treatment for men with benign prostatic hyperplasia (BPH). The laser surgery removed blockages of urine flow, and is done without any incisions on the body.     What is HoLEP?  HoLEP is a type of laser surgery used to treat obstruction (blockage) of urine flow as a result of benign prostatic hyperplasia (BPH). In men with BPH, the prostate gland is not cancerous, but has become enlarged. An enlarged prostate can result in a number of urinary tract symptoms such as weak urinary stream, difficulty in starting urination, inability to urinate, frequent urination, or getting up at night to urinate.  HoLEP was developed in the 1990's as a more effective and less expensive surgical option for BPH, compared to other surgical options such as laser vaporization(PVP/greenlight laser), transurethral resection of the prostate(TURP), and open simple prostatectomy.   What happens during a HoLEP?  HoLEP requires general anesthesia ("asleep" throughout the procedure).   An antibiotic is given to reduce the risk of infection  A surgical instrument called a resectoscope is inserted through the urethra (the tube that carries urine from the bladder). The resectoscope has a camera that allows the surgeon to view the internal structure of the prostate gland, and to see where the incisions are being made during surgery.  The laser is inserted into the resectoscope and is used to enucleate (free up) the enlarged prostate tissue from the capsule (outer shell) and then to seal up any blood vessels. The tissue that has been removed is pushed back into the bladder.  A morcellator is placed through the resectoscope, and is used to suction out the prostate tissue that has been pushed into the bladder.  When the prostate tissue has been removed, the resectoscope is removed, and a foley catheter is placed to allow healing and drain the urine from the  bladder.     What happens after a HoLEP?  More than 90% of patients go home the same day a few hours after surgery. Less than 10% will be admitted to the hospital overnight for observation to monitor the urine, or if they have other medical problems.  Fluid is flushed through the catheter for about 1 hour after surgery to clear any blood from the urine. It is normal to have some blood in the urine after surgery. The need for blood transfusion is extremely rare.  Eating and drinking are permitted after the procedure once the patient has fully awakened from anesthesia.  The catheter is usually removed 2-3 days after surgery- the patient will come to clinic to have the catheter removed and make sure they can urinate on their own.  It is very important to drink lots of fluids after surgery for one week to keep the bladder flushed.  At first, there may be some burning with urination, but this typically improved within a few hours to days. Most patients do not have a significant amount of pain, and narcotic pain medications are rarely needed.  Symptoms of urinary frequency, urgency, and even leakage are NORMAL for the first few weeks after surgery as the bladder adjusts after having to work hard against blockage from the prostate for many years. This will improve, but can sometimes take several months.  The use of pelvic floor exercises (Kegel exercises) can help improve problems with urinary incontinence.   After catheter removal, patients will be seen at 6 weeks and 6 months for symptom check  No heavy lifting for   at least 2-3 weeks after surgery, however patients can walk and do light activities the first day after surgery. Return to work time depends on occupation.    What are the advantages of HoLEP?  HoLEP has been studied in many different parts of the world and has been shown to be a safe and effective procedure. Although there are many types of BPH surgeries available, HoLEP offers a  unique advantage in being able to remove a large amount of tissue without any incisions on the body, even in very large prostates, while decreasing the risk of bleeding and providing tissue for pathology (to look for cancer). This decreases the need for blood transfusions during surgery, minimizes hospital stay, and reduces the risk of needing repeat treatment.  What are the side effects of HoLEP?  Temporary burning and bleeding during urination. Some blood may be seen in the urine for weeks after surgery and is part of the healing process.  Urinary incontinence (inability to control urine flow) is expected in all patients immediately after surgery and they should wear pads for the first few days/weeks. This typically improves over the course of several weeks. Performing Kegel exercises can help decrease leakage from stress maneuvers such as coughing, sneezing, or lifting. The rate of long term leakage is very low. Patients may also have leakage with urgency and this may be treated with medication. The risk of urge incontinence can be dependent on several factors including age, prostate size, symptoms, and other medical problems.  Retrograde ejaculation or "backwards ejaculation." In 75% of cases, the patient will not see any fluid during ejaculation after surgery.  Erectile function is generally not significantly affected.   What are the risks of HoLEP?  Injury to the urethra or development of scar tissue at a later date  Injury to the capsule of the prostate (typically treated with longer catheterization).  Injury to the bladder or ureteral orifices (where the urine from the kidney drains out)  Infection of the bladder, testes, or kidneys  Return of urinary obstruction at a later date requiring another operation (<2%)  Need for blood transfusion or re-operation due to bleeding  Failure to relieve all symptoms and/or need for prolonged catheterization after surgery  5-15% of patients are  found to have previously undiagnosed prostate cancer in their specimen. Prostate cancer can be treated after HoLEP.  Standard risks of anesthesia including blood clots, heart attacks, etc  When should I call my doctor?  Fever over 101.3 degrees  Inability to urinate, or large blood clots in the urine  AMBULATORY SURGERY  DISCHARGE INSTRUCTIONS   The drugs that you were given will stay in your system until tomorrow so for the next 24 hours you should not:  Drive an automobile Make any legal decisions Drink any alcoholic beverage   You may resume regular meals tomorrow.  Today it is better to start with liquids and gradually work up to solid foods.  You may eat anything you prefer, but it is better to start with liquids, then soup and crackers, and gradually work up to solid foods.   Please notify your doctor immediately if you have any unusual bleeding, trouble breathing, redness and pain at the surgery site, drainage, fever, or pain not relieved by medication.    Additional Instructions:        Please contact your physician with any problems or Same Day Surgery at [phone], Monday through Friday 6 am to 4 pm, or [facility] number   at [phone].  

## 2021-09-23 NOTE — Anesthesia Procedure Notes (Signed)
Procedure Name: Intubation Date/Time: 09/23/2021 8:09 AM Performed by: Lesle Reek, CRNA Pre-anesthesia Checklist: Patient identified, Patient being monitored, Timeout performed, Emergency Drugs available and Suction available Patient Re-evaluated:Patient Re-evaluated prior to induction Oxygen Delivery Method: Circle system utilized Preoxygenation: Pre-oxygenation with 100% oxygen Induction Type: IV induction Ventilation: Mask ventilation without difficulty Laryngoscope Size: Mac, 3 and McGraph Grade View: Grade III Tube type: Oral Tube size: 7.5 mm Number of attempts: 2 Airway Equipment and Method: Stylet and Video-laryngoscopy Placement Confirmation: ETT inserted through vocal cords under direct vision, positive ETCO2 and breath sounds checked- equal and bilateral Secured at: 22 cm Dental Injury: Teeth and Oropharynx as per pre-operative assessment

## 2021-09-23 NOTE — H&P (Signed)
09/23/21 [facility] CTAB  Zavon Hyson 04/06/1956 [id number]   Referring provider:  [name], [place],  [place] [address] No chief complaint on file.     HPI: Nathan Anthony is a 66 y.o.male with a personal history of malignant neoplasm of lateral wall of urinary bladder and BPH with bladder outlet obstruction, who presents today for IPSS, PVR and discussion of treatment options.    In 2019 he underwent a CT urogram which was suspicious of bladder tumor. 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    05/2019 cystoscopy revealed approx 2 mm lesion concerning for low-grade very small recurrence. He elected to undergo in office fulguration.    Cysto 09/06/19 showed a small area <1 mm at the dome of the bladder slightly posterior to the level in the midline which is slightly heaped up but no obvious papillary change.    Most recent cystoscopy on 06/18/2021 revealed enlarged prostate with small median lobe.    He reports today the he has been experiencing difficulty starting his stream. He is taking his flomax and changed what time he takes it because if he takes it in the evening her wakes 2-3 times nightly. He has started taking it in the morning which increases his urgency during the day but decreases hi urination during the nighttime.    + median lobe on cysto     IPSS       Row Name 07/24/21 1100                   International Prostate Symptom Score    How often have you had the sensation of not emptying your bladder? Almost always        How often have you had to urinate less than every two hours? More than half the time        How often have you found you stopped and started again several times when you urinated? More than  half the time        How often have you found it difficult to postpone urination? About half the time        How often have you had a weak urinary stream? More than half the time        How often have you had to strain to start urination? About half the time        How many times did you typically get up at night to urinate? 2 Times        Total IPSS Score 25               Quality of Life due to urinary symptoms    If you were to spend the rest of your life with your urinary condition just the way it is now how would you feel about that? Mostly Disatisfied                      Score:  1-7 Mild 8-19 Moderate 20-35 Severe     PMH:     Past Medical History:  Diagnosis Date   Allergy     Asthma     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[facility])     Cancer ([facility])      bladder  Chronic obstructive pulmonary disease (HCC) 6/54/6503   Complication of anesthesia      Asthmatic episode when awakening from colonoscopy   DISH (diffuse idiopathic skeletal hyperostosis)      neck pain   GERD (gastroesophageal reflux disease)     Hyperlipidemia     Hyperlipidemia     Inguinal hernia      Left Side   Nasal congestion      nasal polyps   Osteopenia     Osteoporosis     RA (rheumatoid arthritis) (HCC)      shoulders and hands   Rheumatoid arthritis (HCC)     Sciatica     Sciatica     Sciatica     Tinnitus aurium, left        Surgical History:      Past Surgical History:  Procedure Laterality Date   BASAL CELL CARCINOMA EXCISION       COLONOSCOPY   06/06/2010   COLONOSCOPY WITH PROPOFOL N/A 06/08/2020    Procedure: COLONOSCOPY WITH PROPOFOL;  Surgeon: Lucilla Lame, MD;  Location: [facility];  Service: Endoscopy;  Laterality: N/A;  priority 4   ETHMOIDECTOMY Bilateral 07/03/2020    Procedure: ETHMOIDECTOMY;  Surgeon: Clyde Canterbury, MD;  Location: [facility];  Service: ENT;  Laterality: Bilateral;   FRONTAL SINUS EXPLORATION Bilateral 07/03/2020    Procedure: FRONTAL  SINUS EXPLORATION;  Surgeon: Clyde Canterbury, MD;  Location: [facility];  Service: ENT;  Laterality: Bilateral;   HERNIA REPAIR       IMAGE GUIDED SINUS SURGERY N/A 07/03/2020    Procedure: IMAGE GUIDED SINUS SURGERY;  Surgeon: Clyde Canterbury, MD;  Location: [facility];  Service: ENT;  Laterality: N/A;  need [employer] disk PLACE DISK ON OR CHARGE NURSE DESK 10-01 KP Another disk on OR charge nurse desk 10.14.21 DS   MAXILLARY ANTROSTOMY Bilateral 07/03/2020    Procedure: MAXILLARY ANTROSTOMY with tissue removal;  Surgeon: Clyde Canterbury, MD;  Location: [facility];  Service: ENT;  Laterality: Bilateral;   NASAL SEPTOPLASTY W/ TURBINOPLASTY Bilateral 07/03/2020    Procedure: NASAL SEPTOPLASTY;  Surgeon: Clyde Canterbury, MD;  Location: [facility];  Service: ENT;  Laterality: Bilateral;   SPHENOIDECTOMY Bilateral 07/03/2020    Procedure: Coralee Pesa;  Surgeon: Clyde Canterbury, MD;  Location: [facility];  Service: ENT;  Laterality: Bilateral;   TRANSURETHRAL RESECTION OF BLADDER TUMOR WITH MITOMYCIN-C N/A 06/21/2018    Procedure: TRANSURETHRAL RESECTION OF BLADDER TUMOR WITH Gemcitabine;  Surgeon: Hollice Espy, MD;  Location: [facility];  Service: Urology;  Laterality: N/A;   VARICOCELECTOMY       VARICOCELECTOMY          Home Medications:  Allergies as of 07/24/2021         Reactions    Pravastatin      Other reaction(s): Nausea Only    Simvastatin      Other reaction(s): Nausea Only            Medication List           Accurate as of July 24, 2021 11:59 PM. If you have any questions, ask your nurse or doctor.              Adalimumab 40 MG/0.8ML Pnkt Inject 40 mg into the skin every 14 (fourteen) days.    alendronate 70 MG tablet Commonly known as: FOSAMAX Take 70 mg by mouth once a week.    calcium-vitamin D 250-100 MG-UNIT tablet Take 1 tablet by  mouth 2 (two) times daily.    fluocinonide cream 0.05 % Commonly known as:  LIDEX Apply topically 2 (two) times daily.    loratadine 10 MG tablet Commonly known as: CLARITIN Take 10 mg by mouth daily.    meloxicam 15 MG tablet Commonly known as: MOBIC Take 15 mg by mouth daily.    montelukast 10 MG tablet Commonly known as: SINGULAIR TAKE 1 TABLET DAILY    multivitamin capsule Take 1 capsule by mouth daily.    omeprazole 20 MG capsule Commonly known as: PRILOSEC TAKE 1 CAPSULE DAILY    ProAir HFA 108 (90 Base) MCG/ACT inhaler Generic drug: albuterol USE 1 INHALATION EVERY 6 HOURS AS NEEDED FOR WHEEZING OR SHORTNESS OF BREATH (NEEDS APPOINTMENT FOR FURTHER REFILLS)    rosuvastatin 20 MG tablet Commonly known as: CRESTOR Take 1 tablet (20 mg total) by mouth daily.    tamsulosin 0.4 MG Caps capsule Commonly known as: FLOMAX TAKE 1 CAPSULE DAILY    triamcinolone 55 MCG/ACT Aero nasal inhaler Commonly known as: NASACORT Place 2 sprays into the nose daily.             Allergies:       Allergies  Allergen Reactions   Pravastatin        Other reaction(s): Nausea Only   Simvastatin        Other reaction(s): Nausea Only      Family History:      Family History  Problem Relation Age of Onset   Heart disease Mother     Asthma Mother     Heart disease Brother     Ankylosing spondylitis Brother        Social History:  reports that he quit smoking about 3 years ago. His smoking use included pipe. He has never used smokeless tobacco. He reports current alcohol use. He reports that he does not use drugs.     Physical Exam: BP (!) 145/90    Pulse 74    Ht 5\' 10"  (1.778 m)    Wt 186 lb (84.4 kg)    BMI 26.69 kg/m   Constitutional:  Alert and oriented, No acute distress. HEENT: [place] AT, moist mucus membranes.  Trachea midline, no masses. Cardiovascular: No clubbing, cyanosis, or edema. Respiratory: Normal respiratory effort, no increased work of breathing. Skin: No rashes, bruises or suspicious lesions. Neurologic: Grossly intact, no focal  deficits, moving all 4 extremities. Psychiatric: Normal mood and affect.   Laboratory Data: Recent Labs       Lab Results  Component Value Date    CREATININE 0.90 05/10/2021        Pertinent Imaging:  PVR 150 mL    Assessment & Plan:   BPH with bladder outlet obstruction  - Continue Flomax  - Bladder scan post void residual; 150 ml today  - Discussed HoLEP versus TURP (median lobe) - We discussed alternatives including TURP vs. holmium laser enucleation of the prostate. Differences between the surgical procedures were discussed as well as the risks and benefits of each.  He is most interested in HoLEP.   We discussed the common postoperative course following holep including need for overnight Foley catheter, temporary worsening of irritative voiding symptoms, and occasional stress incontinence which typically lasts up to 6 months but can persist.  We discussed retrograde ejaculation and damage to surrounding structures including the urinary sphincter. Other uncommon complications including hematuria and urinary tract infection.   He understands all of the above and is willing to proceed  as planned.   History of malignant neoplasm of lateral wall of urinary bladder  - NED     - Return for surveillance cystoscopy    Conley Rolls as a scribe for Hollice Espy, MD.,have documented all relevant documentation on the behalf of Hollice Espy, MD,as directed by  Hollice Espy, MD while in the presence of Hollice Espy, MD.   I have reviewed the above documentation for accuracy and completeness, and I agree with the above.    Hollice Espy, MD     [facility] [address], [place], [place] [address] [phone]

## 2021-09-23 NOTE — Op Note (Signed)
Date of procedure: 09/23/21  Preoperative diagnosis:  BPH with BOO  Postoperative diagnosis:  same   Procedure: HoLEP with morcellation  Surgeon: Hollice Espy, MD  Anesthesia: General  Complications: None  Intraoperative findings: Small but well-circumscribed median lobe.  Mild urethral narrowing requiring dilation to accommodate scope.  EBL: Minimal  Specimens: Prostate chips  Drains: 20 French Foley catheter with 50 cc in the balloon  Indication: Nathan Anthony is a 66 y.o. patient with BPH with obstructive symptoms/incomplete bladder emptying.  After reviewing the management options for treatment, he elected to proceed with the above surgical procedure(s). We have discussed the potential benefits and risks of the procedure, side effects of the proposed treatment, the likelihood of the patient achieving the goals of the procedure, and any potential problems that might occur during the procedure or recuperation. Informed consent has been obtained.  Description of procedure:  The patient was taken to the operating room and general anesthesia was induced.  The patient was placed in the dorsal lithotomy position, prepped and draped in the usual sterile fashion, and preoperative antibiotics were administered. A preoperative time-out was performed.   There was some some difficulty initially advancing the resectoscope.  I used a regular 21 [place] cystoscope which showed some narrowing along the length of the penile urethra.  I was able to get the scope in though without difficulty.  I then used male sounds up to 28 [place] in order to serially dilate the urethra.  Using a visual obturator, I was unable to advance a 26 French resectoscope into the bladder.   The bladder was carefully inspected and noted to be moderately trabeculated.  There is an elevated bladder neck with a small yet well-circumscribed median lobe.  The trigone was able to be visualized with some manipulation and the  UOs were a good distance from bladder neck itself.  The prostatic fossa had significant trilobar coaptation with greater than 5 cm prostatic length.  A 550 m laser fiber was then brought in and using settings of 2 J's and 50 Hz, 2 incisions were created at the 5:00 and 7:00 positions of the bladder neck on either side of the median lobe down to the level of the bladder neck/capsular fibers.  The incision was carried down caudally meeting in the midline just above the verumontanum.  The median lobe was then enucleated from a caudal to cranial direction cleaving the adenoma off the underlying capsule rolling it towards the bladder neck and ultimately cleaving the mucosa to free the median lobe into the bladder.   Next, a semilunar incision was created at the prostatic apex on the left side again freeing up the adenoma from the underlying capsule.  Care was taken to avoid any resection past the verumontanum.  This incision was carried around laterally and cranially towards the bladder neck.  Ultimately, I was able to complete the anterior commissure mucosa and the adenoma into the bladder creating a widely patent prostatic fossa.     Next, the same similar incision was created at the right prostatic apex.  This adenoma however ended up being enucleated and more of a piece wise fashion freeing up a large BPH nodules from the capsular fibers.  Once this was completed and cleared from the bladder neck, the prostatic fossa was noted to be widely patent.  Hemostasis was achieved using hemostatic fiber settings.  Bilateral UOs were visualized and free of any injury.  Finally, the 44 French resectoscope was exchanged for nephroscope and  using the Piranha handpiece morcellator, the bladder was distended in each of the prostate chips were evacuated.  The bladder was irrigated several times and smaller joules were clear for the bladder.  This point time, there were no residual fibers appreciated in the bladder.  Hemostasis  was adequate.  10 mg of IV Lasix was administered to help with postoperative diuresis.  A 20 French two-way Foley catheter was then inserted over a catheter guide with 50 cc in the balloon.  The catheter irrigated easily and well.  Patient was then clean and dry, repositioned supine position, reversed from anesthesia, taken to PACU in stable condition.   Plan: Patient will return to the office in 2 days for Foley removal and then in 6 weeks with IPSS/PVR.    Hollice Espy, M.D.

## 2021-09-24 ENCOUNTER — Telehealth: Payer: Self-pay | Admitting: *Deleted

## 2021-09-24 ENCOUNTER — Encounter: Payer: Self-pay | Admitting: Urology

## 2021-09-24 LAB — SURGICAL PATHOLOGY

## 2021-09-24 NOTE — Telephone Encounter (Addendum)
Patient informed, voiced understanding.   ----- Message from Hollice Espy, MD sent at 09/24/2021  1:12 PM EST ----- Please let him know that his surgical pathology was benign, the tissue was sent from his prostate showed no cancer.  Hollice Espy, MD

## 2021-09-24 NOTE — Progress Notes (Signed)
Urological history:  1. Bladder cancer -pTa disease -TURBT 06/2018 -cysto 09/2019 - fulguration of < 1 mm lesion -cysto 09/2021 - NED for cancer  2. High risk hematuria -smokes a pipe -CTU 2019 - 2 cm bladder lesion and 17 mm right renal cyst -cysto 2019 - 2.5 cm bladder lesion - see # 1  3. BPH with LU TS -PSA 0.7 05/2021 -managed tamsulosin 0.4 mg daily -HoLEP 09/2021 - trabeculated bladder - pathology NED  4. Nocturia -Risk factors for nocturia: COPD, hypertension and BPH.    Patient underwent HoLEP with Dr. Erlene Quan on 09/23/2021.  His postoperative course has been as expected and uneventful.   Reviewed post operative course following HoLEP of temporary worsening of irritative voiding symptoms, SUI and retrograde ejaculation.    Patient is present today for a catheter removal.  50 ml of water was drained from the balloon. A 20 FR foley cath was removed from the bladder no complications were noted . Patient tolerated well.  Performed by: Zara Council, PA-C   Follow up/ Additional notes: 6 weeks for I PSS/PVR  (11/05/2021 with Dr. Erlene Quan)

## 2021-09-24 NOTE — Anesthesia Postprocedure Evaluation (Signed)
Anesthesia Post Note  Patient: Nathan Anthony  Procedure(s) Performed: HOLEP-LASER ENUCLEATION OF THE PROSTATE WITH MORCELLATION  Patient location during evaluation: PACU Anesthesia Type: General Level of consciousness: awake and alert Pain management: pain level controlled Vital Signs Assessment: post-procedure vital signs reviewed and stable Respiratory status: spontaneous breathing, nonlabored ventilation and respiratory function stable Cardiovascular status: blood pressure returned to baseline and stable Postop Assessment: no apparent nausea or vomiting Anesthetic complications: no   No notable events documented.   Last Vitals:  Vitals:   09/23/21 1003 09/23/21 1107  BP: (!) 142/80 126/69  Pulse: 75 68  Resp: 18 16  Temp: (!) 36.2 C (!) 36.3 C  SpO2: 96% 97%    Last Pain:  Vitals:   09/23/21 1107  TempSrc: Temporal  PainSc: 

## 2021-09-25 ENCOUNTER — Other Ambulatory Visit: Payer: Self-pay

## 2021-09-25 ENCOUNTER — Ambulatory Visit (INDEPENDENT_AMBULATORY_CARE_PROVIDER_SITE_OTHER): Admitting: Urology

## 2021-09-25 DIAGNOSIS — N138 Other obstructive and reflux uropathy: Secondary | ICD-10-CM

## 2021-09-25 DIAGNOSIS — N401 Enlarged prostate with lower urinary tract symptoms: Secondary | ICD-10-CM

## 2021-10-30 ENCOUNTER — Encounter: Payer: Self-pay | Admitting: Primary Care

## 2021-10-30 ENCOUNTER — Other Ambulatory Visit: Payer: Self-pay

## 2021-10-30 ENCOUNTER — Ambulatory Visit (INDEPENDENT_AMBULATORY_CARE_PROVIDER_SITE_OTHER): Admitting: Primary Care

## 2021-10-30 ENCOUNTER — Ambulatory Visit
Admission: RE | Admit: 2021-10-30 | Discharge: 2021-10-30 | Disposition: A | Discharge status: Home / Self Care | Attending: Primary Care | Admitting: Primary Care

## 2021-10-30 ENCOUNTER — Ambulatory Visit
Admission: RE | Admit: 2021-10-30 | Discharge: 2021-10-30 | Disposition: A | Discharge status: Home / Self Care | Source: Ambulatory Visit | Attending: Primary Care | Admitting: Primary Care

## 2021-10-30 VITALS — BP 100/60 | HR 78 | Temp 98.2°F | Ht 70.5 in | Wt 187.2 lb

## 2021-10-30 DIAGNOSIS — J449 Chronic obstructive pulmonary disease, unspecified: Secondary | ICD-10-CM | POA: Diagnosis not present

## 2021-10-30 DIAGNOSIS — R918 Other nonspecific abnormal finding of lung field: Secondary | ICD-10-CM | POA: Diagnosis not present

## 2021-10-30 DIAGNOSIS — J4520 Mild intermittent asthma, uncomplicated: Secondary | ICD-10-CM

## 2021-10-30 MED ORDER — ALBUTEROL SULFATE HFA 108 (90 BASE) MCG/ACT IN AERS: INHALATION_SPRAY | RESPIRATORY_TRACT | 3 refills | Status: DC

## 2021-10-30 NOTE — Assessment & Plan Note (Addendum)
-   Stable; No recent exacerbations. Occasional SABA use. CAT score 7. Not currently on maintenance ICS/LABA, he was previous on Advair for a long time but had headaches. Taking Singulair 10mg  daily and PRN Albuterol and prefers this regimen. Checking CXR d/t cough. Advised he take mucinex 600mg  twice daily until congestion resolves. FU in 1 year or sooner if needed.  ?

## 2021-10-30 NOTE — Progress Notes (Signed)
@Patient  ID: Nathan Anthony, male    DOB: Oct 27, 1955, 67 y.o.   MRN: [id number]  Chief Complaint  Patient presents with   Follow-up    Referring provider: [name], MD  HPI: 66 year old male, Former smoker.  Past medical history significant for mild persistent asthma, nasal septum deviation, aortic arthrosclerosis, GERD, rheumatoid arthritis, bladder cancer, calcified granuloma of lung, hyperlipidemia.  Patient of Dr. Mortimer Fries, last seen in office on 08/04/2019.  Consistent with moderate COPD.  Sleep study negative for OSA.   Previous [facility] pulmonary encounter:  10/10/2020 Presents today for annual follow-up.  He has been off Advair for the last 1 to 2 years. Feels his breathing has been remarkably better since sinus surgery. Asthma still causes some problems especially with weather changes. He is still using Albuterol rescue inhaler on average 6 times a month. Sometimes he will not use it for a whole month. Other times he will need to use it daily. His breathing is no worse than it has been the last 5 years. He feels better since being off maintenance ICS/LABA inhaler.  He takes Singulair, notices a difference when he does not take it. CAT score 9   10/30/2021- Interim hx  Patient presents today for 1 year follow-up. He had prostate surgery in January 2023. He is doing well. States that his breathing is the best its been since 1995. He is currently on Singulair and prn albuterol only. Breathing symptoms are worse with weather changes. He has been using his rescue inhaler more recently, max 2-3 times a week. He has occasional coughing spasms with phlegm which is new the last 6 months. PFT in May 2018 showed moderate obstructive airways disease with significant BD response. [facility] was discharged from the [employer] d/t asthma. He was on Advair for a long time. He does not feel he needs to go back on maintenance inhaler. He had previous headaches with Advair. CAT score 7.    Allergies  Allergen  Reactions   Pravastatin     Other reaction(s): Nausea Only   Simvastatin     Other reaction(s): Nausea Only    Immunization History  Administered Date(s) Administered   Influenza,inj,Quad PF,6+ Mos 05/22/2015, 08/07/2016, 06/25/2018, 06/21/2019, 05/03/2020, 05/10/2021   Influenza,inj,Quad PF,6-35 Mos 05/22/2015, 08/07/2016   Janssen (J&J) SARS-COV-2 Vaccination 12/12/2019   Moderna Sars-Covid-2 Vaccination 07/30/2020   OPV 10/20/1995   Pneumococcal Polysaccharide-23 04/22/2017   Tdap 12/12/2014   Yellow Fever 06/28/1992    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[place])    Cancer ([place])    bladder   Chronic obstructive pulmonary disease ([place]) 02/20/2978   Complication of anesthesia    Asthmatic episode when awakening from colonoscopy   DISH (diffuse idiopathic skeletal hyperostosis)    neck pain   GERD (gastroesophageal reflux disease)    Hyperlipidemia    Hyperlipidemia    Inguinal hernia    Left Side   Nasal congestion    nasal polyps   Osteopenia    Osteoporosis    RA (rheumatoid arthritis) (HCC)    shoulders and hands   Rheumatoid arthritis (HCC)    Sciatica    Sciatica    Sciatica    Tinnitus aurium, left     Tobacco History: Social History   Tobacco Use  Smoking Status Former   Types: Pipe   Quit date: 2019   Years since quitting: 4.1  Smokeless Tobacco Never  Counseling given: Not Answered   Outpatient Medications Prior to Visit  Medication Sig Dispense Refill   acetaminophen (TYLENOL) 500 MG tablet Take 1,000 mg by mouth every 6 (six) hours as needed.     Adalimumab 40 MG/0.8ML PNKT Inject 40 mg into the skin every 14 (fourteen) days.     alendronate (FOSAMAX) 70 MG tablet Take 70 mg by mouth once a week.     calcium-vitamin D 250-100 MG-UNIT tablet Take 1 tablet by mouth daily.     fluocinonide cream (LIDEX) 0.05 % Apply topically 2 (two) times daily. (Patient taking differently: Apply 1  application topically daily as needed (Rash on face).) 30 g 0   ketotifen (ZADITOR) 0.025 % ophthalmic solution Place 1 drop into both eyes once a week.     loratadine (CLARITIN) 10 MG tablet Take 10 mg by mouth daily.     meloxicam (MOBIC) 15 MG tablet Take 15 mg by mouth daily.     montelukast (SINGULAIR) 10 MG tablet TAKE 1 TABLET DAILY 90 tablet 3   Multiple Vitamin (MULTIVITAMIN) capsule Take 1 capsule by mouth daily.     omeprazole (PRILOSEC) 20 MG capsule TAKE 1 CAPSULE DAILY 90 capsule 3   rosuvastatin (CRESTOR) 20 MG tablet Take 1 tablet (20 mg total) by mouth daily. 90 tablet 1   triamcinolone (NASACORT) 55 MCG/ACT AERO nasal inhaler Place 2 sprays into the nose daily.     HYDROcodone-acetaminophen (NORCO/VICODIN) 5-325 MG tablet Take 1-2 tablets by mouth every 6 (six) hours as needed for moderate pain. 10 tablet 0   oxybutynin (DITROPAN) 5 MG tablet Take 1 tablet (5 mg total) by mouth every 8 (eight) hours as needed for bladder spasms. 10 tablet 0   PROAIR HFA 108 (90 Base) MCG/ACT inhaler USE 1 INHALATION EVERY 6 HOURS AS NEEDED FOR WHEEZING OR SHORTNESS OF BREATH (NEEDS APPOINTMENT FOR FURTHER REFILLS) 25.5 g 3   tamsulosin (FLOMAX) 0.4 MG CAPS capsule TAKE 1 CAPSULE DAILY 90 capsule 3   No facility-administered medications prior to visit.    Review of Systems  Review of Systems  Constitutional: Negative.   HENT: Negative.    Respiratory:  Positive for cough. Negative for chest tightness, shortness of breath and wheezing.   Cardiovascular: Negative.   Psychiatric/Behavioral: Negative.      Physical Exam  BP 100/60 (BP Location: Left Arm, Patient Position: Sitting, Cuff Size: Normal)    Pulse 78    Temp 98.2 F (36.8 C) (Oral)    Ht 5' 10.5" (1.791 m)    Wt 187 lb 3.2 oz (84.9 kg)    SpO2 98%    BMI 26.48 kg/m  Physical Exam Constitutional:      Appearance: Normal appearance.  HENT:     Head: Normocephalic and atraumatic.     Mouth/Throat:     Mouth: Mucous membranes  are moist.     Pharynx: Oropharynx is clear.  Cardiovascular:     Rate and Rhythm: Normal rate and regular rhythm.  Pulmonary:     Effort: Pulmonary effort is normal.     Breath sounds: Normal breath sounds.  Musculoskeletal:        General: Normal range of motion.  Skin:    General: Skin is warm and dry.  Neurological:     General: No focal deficit present.     Mental Status: He is alert and oriented to person, place, and time. Mental status is at baseline.  Psychiatric:        Mood  and Affect: Mood normal.        Behavior: Behavior normal.        Thought Content: Thought content normal.        Judgment: Judgment normal.     Lab Results:  CBC    Component Value Date/Time   WBC 10.6 (H) 09/12/2021 1339   RBC 5.16 09/12/2021 1339   HGB 13.2 09/12/2021 1339   HGB 13.0 05/10/2021 0918   HCT 42.8 09/12/2021 1339   HCT 40.6 05/10/2021 0918   PLT 397 09/12/2021 1339   PLT 405 05/10/2021 0918   MCV 82.9 09/12/2021 1339   MCV 82 05/10/2021 0918   MCH 25.6 (L) 09/12/2021 1339   MCHC 30.8 09/12/2021 1339   RDW 14.2 09/12/2021 1339   RDW 14.5 05/10/2021 0918   LYMPHSABS 2.6 05/10/2021 0918   MONOABS 0.6 01/05/2017 0902   EOSABS 0.5 (H) 05/10/2021 0918   BASOSABS 0.1 05/10/2021 0918    BMET    Component Value Date/Time   NA 140 09/12/2021 1339   NA 140 05/10/2021 0918   K 4.2 09/12/2021 1339   CL 103 09/12/2021 1339   CO2 28 09/12/2021 1339   GLUCOSE 87 09/12/2021 1339   BUN 12 09/12/2021 1339   BUN 16 05/10/2021 0918   CREATININE 0.75 09/12/2021 1339   CALCIUM 9.2 09/12/2021 1339   GFRNONAA >60 09/12/2021 1339   GFRAA 109 05/03/2020 0937    BNP No results found for: BNP  ProBNP No results found for: PROBNP  Imaging: No results found.   Assessment & Plan:   COPD mixed type ([place]) - Stable; No recent exacerbations. Occasional SABA use. CAT score 7. Not currently on maintenance ICS/LABA, he was previous on Advair for a long time but had headaches. Taking  Singulair 10mg  daily and PRN Albuterol and prefers this regimen. Checking CXR d/t cough. Advised he take mucinex 600mg  twice daily until congestion resolves. FU in 1 year or sooner if needed.    Martyn Ehrich, NP 10/30/2021

## 2021-10-30 NOTE — Patient Instructions (Signed)
Recommendations: ?Continue Singulair 10mg  at bedtime  ?Continue Proair HFA 2 puffs every 4-6 hours as needed for breakthrough shortness of breath  ?Start mucinex 600mg  twice daily for chest congestion x 1-2 weeks or until resolved   ? ?Orders: ?CXR today  ? ?Follow-up: ?1 year with Dr. Mortimer Fries  ?

## 2021-11-01 MED ORDER — DOXYCYCLINE HYCLATE 100 MG PO TABS: 100.0000 mg | ORAL_TABLET | Freq: Two times a day (BID) | ORAL | 0 refills | Status: DC

## 2021-11-01 NOTE — Addendum Note (Signed)
Addended by: Martyn Ehrich on: 11/01/2021 05:23 PM ? ? Modules accepted: Orders ? ?

## 2021-11-01 NOTE — Addendum Note (Signed)
Addended by: Martyn Ehrich on: 11/01/2021 05:25 PM ? ? Modules accepted: Orders ? ?

## 2021-11-04 NOTE — Progress Notes (Signed)
11/05/21 9:36 AM   Nathan Anthony 08-09-1956 [id number]  Referring provider:  [name], MD [address] [place],  [place] [address] Chief Complaint  Patient presents with   Benign Prostatic Hypertrophy     HPI: Nathan Anthony is a 66 y.o.male with a personal history of bladder cancer, high risk hematuria, BPH with LUTS, and nocturia, who presents today for 6 week post-op follow-up with IPSS and PVR.   In 2019 he underwent a CT urogram which was suspicious of bladder tumor. 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    05/2019 cystoscopy revealed approx 2 mm lesion concerning for low-grade very small recurrence. He elected to undergo in office fulguration.   Most recent cystoscopy on 06/18/2021 revealed enlarged prostate with small median lobe.   He is s/p HoLEP on 09/23/2021. Intraoperative findings: Small but well-circumscribed median lobe.  Mild urethral narrowing requiring dilation to accommodate scope. Surgical pathology was negative for malignancy but consistent with benign prostatic tissue with nodular hyperplasia.    He has an improved stream. He reports that his urination has improved he still uses pads and guards. He reports that he goes through 1-2 guards a day. He reports that when he sits and gets up he leaks this is about 10-20 mL.   He reports today that he has a lung infection. He leaks when he coughs and sneezes.   PMH: 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[place])    Cancer ([facility])    bladder   Chronic obstructive pulmonary disease ([place]) 0/96/2836   Complication of anesthesia    Asthmatic episode when awakening from colonoscopy   DISH (diffuse idiopathic skeletal  hyperostosis)    neck pain   GERD (gastroesophageal reflux disease)    Hyperlipidemia    Hyperlipidemia    Inguinal hernia    Left Side   Nasal congestion    nasal polyps   Osteopenia    Osteoporosis    RA (rheumatoid arthritis) (H[facility])    shoulders and hands   Rheumatoid arthritis ([facility])    Sciatica    Sciatica    Sciatica    Tinnitus aurium, left     Surgical History: Past Surgical History:  Procedure Laterality Date   BASAL CELL CARCINOMA EXCISION     COLONOSCOPY  06/06/2010   COLONOSCOPY WITH PROPOFOL N/A 06/08/2020   Procedure: COLONOSCOPY WITH PROPOFOL;  Surgeon: Lucilla Lame, MD;  Location: [facility];  Service: Endoscopy;  Laterality: N/A;  priority 4   ETHMOIDECTOMY Bilateral 07/03/2020   Procedure: ETHMOIDECTOMY;  Surgeon: Clyde Canterbury, MD;  Location: [facility];  Service: ENT;  Laterality: Bilateral;   FRONTAL SINUS EXPLORATION Bilateral 07/03/2020   Procedure: FRONTAL SINUS EXPLORATION;  Surgeon: Clyde Canterbury, MD;  Location: [facility];  Service: ENT;  Laterality: Bilateral;   HERNIA REPAIR     HOLEP-LASER ENUCLEATION OF THE PROSTATE WITH MORCELLATION N/A 09/23/2021   Procedure: HOLEP-LASER ENUCLEATION OF THE PROSTATE WITH MORCELLATION;  Surgeon: Hollice Espy, MD;  Location: [facility];  Service: Urology;  Laterality: N/A;   IMAGE GUIDED SINUS SURGERY N/A 07/03/2020   Procedure: IMAGE GUIDED SINUS SURGERY;  Surgeon: Clyde Canterbury, MD;  Location: [facility];  Service:  ENT;  Laterality: N/A;  need [employer] disk PLACE DISK ON OR CHARGE NURSE DESK 10-01 KP Another disk on OR charge nurse desk 10.14.21 DS   MAXILLARY ANTROSTOMY Bilateral 07/03/2020   Procedure: MAXILLARY ANTROSTOMY with tissue removal;  Surgeon: Clyde Canterbury, MD;  Location: [facility];  Service: ENT;  Laterality: Bilateral;   NASAL SEPTOPLASTY W/ TURBINOPLASTY Bilateral 07/03/2020   Procedure: NASAL SEPTOPLASTY;  Surgeon: Clyde Canterbury, MD;  Location: [facility];  Service: ENT;  Laterality: Bilateral;   SPHENOIDECTOMY Bilateral 07/03/2020   Procedure: Coralee Pesa;  Surgeon: Clyde Canterbury, MD;  Location: [facility];  Service: ENT;  Laterality: Bilateral;   TRANSURETHRAL RESECTION OF BLADDER TUMOR WITH MITOMYCIN-C N/A 06/21/2018   Procedure: TRANSURETHRAL RESECTION OF BLADDER TUMOR WITH Gemcitabine;  Surgeon: Hollice Espy, MD;  Location: [facility];  Service: Urology;  Laterality: N/A;   VARICOCELECTOMY     VARICOCELECTOMY      Home Medications:  Allergies as of 11/05/2021       Reactions   Pravastatin    Other reaction(s): Nausea Only   Simvastatin    Other reaction(s): Nausea Only        Medication List        Accurate as of November 05, 2021  9:36 AM. If you have any questions, ask your nurse or doctor.          acetaminophen 500 MG tablet Commonly known as: TYLENOL Take 1,000 mg by mouth every 6 (six) hours as needed.   Adalimumab 40 MG/0.8ML Pnkt Inject 40 mg into the skin every 14 (fourteen) days.   albuterol 108 (90 Base) MCG/ACT inhaler Commonly known as: ProAir HFA USE 1 INHALATION EVERY 6 HOURS AS NEEDED FOR WHEEZING OR SHORTNESS OF BREATH (NEEDS APPOINTMENT FOR FURTHER REFILLS)   alendronate 70 MG tablet Commonly known as: FOSAMAX Take 70 mg by mouth once a week.   calcium-vitamin D 250-100 MG-UNIT tablet Take 1 tablet by mouth daily.   doxycycline 100 MG tablet Commonly known as: VIBRA-TABS Take 1 tablet (100 mg total) by mouth 2 (two) times daily.   fluocinonide cream 0.05 % Commonly known as: LIDEX Apply topically 2 (two) times daily. What changed:  how much to take when to take this reasons to take this   ketotifen 0.025 % ophthalmic solution Commonly known as: ZADITOR Place 1 drop into both eyes once a week.   loratadine 10 MG tablet Commonly known as: CLARITIN Take 10 mg by mouth daily.   meloxicam 15 MG tablet Commonly known as: MOBIC Take 15 mg by mouth daily.    montelukast 10 MG tablet Commonly known as: SINGULAIR TAKE 1 TABLET DAILY   multivitamin capsule Take 1 capsule by mouth daily.   omeprazole 20 MG capsule Commonly known as: PRILOSEC TAKE 1 CAPSULE DAILY   rosuvastatin 20 MG tablet Commonly known as: CRESTOR Take 1 tablet (20 mg total) by mouth daily.   triamcinolone 55 MCG/ACT Aero nasal inhaler Commonly known as: NASACORT Place 2 sprays into the nose daily.        Allergies:  Allergies  Allergen Reactions   Pravastatin     Other reaction(s): Nausea Only   Simvastatin     Other reaction(s): Nausea Only    Family History: Family History  Problem Relation Age of Onset   Heart disease Mother    Asthma Mother    Heart disease Brother    Ankylosing spondylitis Brother     Social History:  reports that he quit smoking  about 4 years ago. His smoking use included pipe. He has never used smokeless tobacco. He reports current alcohol use. He reports that he does not use drugs.   Physical Exam: BP (!) 154/80    Pulse 81    Ht 5' 10.5" (1.791 m)    Wt 184 lb (83.5 kg)    BMI 26.03 kg/m   Constitutional:  Alert and oriented, No acute distress. HEENT: [place] AT, moist mucus membranes.  Trachea midline, no masses. Cardiovascular: No clubbing, cyanosis, or edema. Respiratory: Normal respiratory effort, no increased work of breathing. Skin: No rashes, bruises or suspicious lesions. Neurologic: Grossly intact, no focal deficits, moving all 4 extremities. Psychiatric: Normal mood and affect.  Laboratory Data:  Lab Results  Component Value Date   CREATININE 0.75 09/12/2021    Pertinent Imaging: Results for orders placed or performed in visit on 11/05/21  BLADDER SCAN AMB NON-IMAGING  Result Value Ref Range   Scan Result 28     Assessment & Plan:   BPH with bladder outlet obstruction  - S/p HoLEP;dramatic improvement in obstructive urinary symptoms  - He is emptying adequately today  - PVR 28 mL  2. Stress urinary  incontinence  - We discussed the post-operative course in detail today.  - Recommend he start Kegel exercises to improve and strengthen his pelvic floor muscles. He was offered a referral to physical therapy if he would like to undergo this. He will let us know.   3. History of malignant neoplasm of lateral wall of urinary bladder  - NED  - Will return for surveillance cystoscopy   Return in about 6 months (around 05/08/2022) for Cysto.  I,Kailey Littlejohn,acting as a Education administrator for Hollice Espy, MD.,have documented all relevant documentation on the behalf of Hollice Espy, MD,as directed by  Hollice Espy, MD while in the presence of Hollice Espy, MD.  I have reviewed the above documentation for accuracy and completeness, and I agree with the above.   Hollice Espy, MD   [facility] [address], [place], [place] [address] [phone]

## 2021-11-05 ENCOUNTER — Other Ambulatory Visit: Payer: Self-pay | Admitting: Internal Medicine

## 2021-11-05 ENCOUNTER — Encounter: Payer: Self-pay | Admitting: Urology

## 2021-11-05 ENCOUNTER — Other Ambulatory Visit: Payer: Self-pay

## 2021-11-05 ENCOUNTER — Ambulatory Visit (INDEPENDENT_AMBULATORY_CARE_PROVIDER_SITE_OTHER): Admitting: Urology

## 2021-11-05 VITALS — BP 154/80 | HR 81 | Ht 70.5 in | Wt 184.0 lb

## 2021-11-05 DIAGNOSIS — N393 Stress incontinence (female) (male): Secondary | ICD-10-CM | POA: Diagnosis not present

## 2021-11-05 DIAGNOSIS — E782 Mixed hyperlipidemia: Secondary | ICD-10-CM

## 2021-11-05 DIAGNOSIS — N138 Other obstructive and reflux uropathy: Secondary | ICD-10-CM

## 2021-11-05 DIAGNOSIS — N401 Enlarged prostate with lower urinary tract symptoms: Secondary | ICD-10-CM

## 2021-11-05 DIAGNOSIS — I700 Atherosclerosis of aorta: Secondary | ICD-10-CM

## 2021-11-05 LAB — BLADDER SCAN AMB NON-IMAGING: Scan Result: 28

## 2021-11-05 NOTE — Patient Instructions (Signed)

## 2021-11-06 NOTE — Telephone Encounter (Signed)
Requested Prescriptions  ?Pending Prescriptions Disp Refills  ?? rosuvastatin (CRESTOR) 20 MG tablet [Pharmacy Med Name: ROSUVASTATIN TABS '20MG'$ ] 90 tablet 1  ?  Sig: TAKE 1 TABLET DAILY  ?  ? Cardiovascular:  Antilipid - Statins 2 Failed - 11/05/2021 10:51 AM  ?  ?  Failed - Lipid Panel in normal range within the last 12 months  ?  Cholesterol, Total  ?Date Value Ref Range Status  ?05/10/2021 141 100 - 199 mg/dL Final  ? ?LDL Chol Calc (NIH)  ?Date Value Ref Range Status  ?05/10/2021 71 0 - 99 mg/dL Final  ? ?HDL  ?Date Value Ref Range Status  ?05/10/2021 28 (L) >39 mg/dL Final  ? ?Triglycerides  ?Date Value Ref Range Status  ?05/10/2021 259 (H) 0 - 149 mg/dL Final  ? ?  ?  ?  Passed - Cr in normal range and within 360 days  ?  Creatinine, Ser  ?Date Value Ref Range Status  ?09/12/2021 0.75 0.61 - 1.24 mg/dL Final  ?   ?  ?  Passed - Patient is not pregnant  ?  ?  Passed - Valid encounter within last 12 months  ?  Recent Outpatient Visits   ?      ? 6 months ago Annual physical exam  ? [facility] [name], MD  ? 1 year ago Annual physical exam  ? [facility] [name], MD  ? 1 year ago Cellulitis of left lower extremity  ? [facility] [name], MD  ? 2 years ago Annual physical exam  ? [facility] [name], MD  ? 3 years ago Prostate cancer screening  ? [facility] [name], MD  ?  ?  ?Future Appointments   ?        ? In 1 month Ralene Bathe, MD [place]  ?  ? ?  ?  ?  ? ?

## 2021-11-18 ENCOUNTER — Ambulatory Visit: Admitting: Dermatology

## 2021-12-12 ENCOUNTER — Telehealth: Payer: Self-pay | Admitting: *Deleted

## 2021-12-12 ENCOUNTER — Ambulatory Visit
Admission: RE | Admit: 2021-12-12 | Discharge: 2021-12-12 | Disposition: A | Discharge status: Home / Self Care | Payer: Medicare Other | Attending: Primary Care | Admitting: Primary Care

## 2021-12-12 ENCOUNTER — Ambulatory Visit
Admission: RE | Admit: 2021-12-12 | Discharge: 2021-12-12 | Disposition: A | Discharge status: Home / Self Care | Payer: Medicare Other | Source: Ambulatory Visit | Attending: Primary Care | Admitting: Primary Care

## 2021-12-12 DIAGNOSIS — R918 Other nonspecific abnormal finding of lung field: Secondary | ICD-10-CM

## 2021-12-12 DIAGNOSIS — J449 Chronic obstructive pulmonary disease, unspecified: Secondary | ICD-10-CM

## 2021-12-12 NOTE — Addendum Note (Signed)
Addended by: Vanessa Barbara on: 12/12/2021 11:45 AM ? ? Modules accepted: Orders ? ?

## 2021-12-12 NOTE — Telephone Encounter (Signed)
Patient arrived at [name] for CXR per Geraldo Pitter NP.  I received a call from Wells Guiles at [facility] stating that the CXR order had expired 10 days ago and another order needed to be put in.  I reviewed BW's notes and her previous order.  New order placed for f/u abnormal cxr, Copd, preop.  Nothing further needed. ?

## 2021-12-19 NOTE — Progress Notes (Signed)
CXR 12/12/21 showed no active cardiopulmonary disease.

## 2021-12-23 ENCOUNTER — Ambulatory Visit (INDEPENDENT_AMBULATORY_CARE_PROVIDER_SITE_OTHER): Payer: Medicare Other | Admitting: Dermatology

## 2021-12-23 DIAGNOSIS — L814 Other melanin hyperpigmentation: Secondary | ICD-10-CM

## 2021-12-23 DIAGNOSIS — Z1283 Encounter for screening for malignant neoplasm of skin: Secondary | ICD-10-CM

## 2021-12-23 DIAGNOSIS — L820 Inflamed seborrheic keratosis: Secondary | ICD-10-CM

## 2021-12-23 DIAGNOSIS — D229 Melanocytic nevi, unspecified: Secondary | ICD-10-CM

## 2021-12-23 DIAGNOSIS — L570 Actinic keratosis: Secondary | ICD-10-CM

## 2021-12-23 DIAGNOSIS — Z85828 Personal history of other malignant neoplasm of skin: Secondary | ICD-10-CM

## 2021-12-23 DIAGNOSIS — L578 Other skin changes due to chronic exposure to nonionizing radiation: Secondary | ICD-10-CM

## 2021-12-23 DIAGNOSIS — D1800 Hemangioma unspecified site: Secondary | ICD-10-CM

## 2021-12-23 DIAGNOSIS — L821 Other seborrheic keratosis: Secondary | ICD-10-CM

## 2021-12-23 NOTE — Patient Instructions (Addendum)
Actinic keratoses are precancerous spots that appear secondary to cumulative UV radiation exposure/sun exposure over time. They are chronic with expected duration over 1 year. A portion of actinic keratoses will progress to squamous cell carcinoma of the skin. It is not possible to reliably predict which spots will progress to skin cancer and so treatment is recommended to prevent development of skin cancer. ? ?Recommend daily broad spectrum sunscreen SPF 30+ to sun-exposed areas, reapply every 2 hours as needed.  ?Recommend staying in the shade or wearing long sleeves, sun glasses (UVA+UVB protection) and wide brim hats (4-inch brim around the entire circumference of the hat). ?Call for new or changing lesions.  ? ?Cryotherapy Aftercare ? ?Wash gently with soap and water everyday.   ?Apply Vaseline and Band-Aid daily until healed.  ? ?Seborrheic Keratosis ? ?What causes seborrheic keratoses? ?Seborrheic keratoses are harmless, common skin growths that first appear during adult life.  As time goes by, more growths appear.  Some people may develop a large number of them.  Seborrheic keratoses appear on both covered and uncovered body parts.  They are not caused by sunlight.  The tendency to develop seborrheic keratoses can be inherited.  They vary in color from skin-colored to gray, brown, or even black.  They can be either smooth or have a rough, warty surface.   ?Seborrheic keratoses are superficial and look as if they were stuck on the skin.  Under the microscope this type of keratosis looks like layers upon layers of skin.  That is why at times the top layer may seem to fall off, but the rest of the growth remains and re-grows.   ? ?Treatment ?Seborrheic keratoses do not need to be treated, but can easily be removed in the office.  Seborrheic keratoses often cause symptoms when they rub on clothing or jewelry.  Lesions can be in the way of shaving.  If they become inflamed, they can cause itching, soreness, or  burning.  Removal of a seborrheic keratosis can be accomplished by freezing, burning, or surgery. ?If any spot bleeds, scabs, or grows rapidly, please return to have it checked, as these can be an indication of a skin cancer. ? ?Melanoma ABCDEs ? ?Melanoma is the most dangerous type of skin cancer, and is the leading cause of death from skin disease.  You are more likely to develop melanoma if you: ?Have light-colored skin, light-colored eyes, or red or blond hair ?Spend a lot of time in the sun ?Tan regularly, either outdoors or in a tanning bed ?Have had blistering sunburns, especially during childhood ?Have a close family member who has had a melanoma ?Have atypical moles or large birthmarks ? ?Early detection of melanoma is key since treatment is typically straightforward and cure rates are extremely high if we catch it early.  ? ?The first sign of melanoma is often a change in a mole or a new dark spot.  The ABCDE system is a way of remembering the signs of melanoma. ? ?A for asymmetry:  The two halves do not match. ?B for border:  The edges of the growth are irregular. ?C for color:  A mixture of colors are present instead of an even brown color. ?D for diameter:  Melanomas are usually (but not always) greater than 78m - the size of a pencil eraser. ?E for evolution:  The spot keeps changing in size, shape, and color. ? ?Please check your skin once per month between visits. You can use a small mirror  in front and a large mirror behind you to keep an eye on the back side or your body.  ? ?If you see any new or changing lesions before your next follow-up, please call to schedule a visit. ? ?Please continue daily skin protection including broad spectrum sunscreen SPF 30+ to sun-exposed areas, reapplying every 2 hours as needed when you're outdoors.  ? ?Staying in the shade or wearing long sleeves, sun glasses (UVA+UVB protection) and wide brim hats (4-inch brim around the entire circumference of the hat) are also  recommended for sun protection.   ? ? ? ?If You Need Anything After Your Visit ? ?If you have any questions or concerns for your doctor, please call our main line at [phone] and press option 4 to reach your doctor's medical assistant. If no one answers, please leave a voicemail as directed and we will return your call as soon as possible. Messages left after 4 pm will be answered the following business day.  ? ?You may also send [place] a message via MyChart. We typically respond to MyChart messages within 1-2 business days. ? ?For prescription refills, please ask your pharmacy to contact our office. Our fax number is [phone]. ? ?If you have an urgent issue when the clinic is closed that cannot wait until the next business day, you can page your doctor at the number below.   ? ?Please note that while we do our best to be available for urgent issues outside of office hours, we are not available 24/7.  ? ?If you have an urgent issue and are unable to reach [place], you may choose to seek medical care at your doctor's office, retail clinic, urgent care center, or emergency room. ? ?If you have a medical emergency, please immediately call 911 or go to the emergency department. ? ?Pager Numbers ? ?- Dr. Nehemiah Massed: [phone] ? ?- Dr. Laurence Ferrari: [phone] ? ?- Dr. Nicole Kindred: [phone] ? ?In the event of inclement weather, please call our main line at [phone] for an update on the status of any delays or closures. ? ?Dermatology Medication Tips: ?Please keep the boxes that topical medications come in in order to help keep track of the instructions about where and how to use these. Pharmacies typically print the medication instructions only on the boxes and not directly on the medication tubes.  ? ?If your medication is too expensive, please contact our office at [phone] option 4 or send [place] a message through [facility].  ? ?We are unable to tell what your co-pay for medications will be in advance as this is different  depending on your insurance coverage. However, we may be able to find a substitute medication at lower cost or fill out paperwork to get insurance to cover a needed medication.  ? ?If a prior authorization is required to get your medication covered by your insurance company, please allow [place] 1-2 business days to complete this process. ? ?Drug prices often vary depending on where the prescription is filled and some pharmacies may offer cheaper prices. ? ?The website www.goodrx.com contains coupons for medications through different pharmacies. The prices here do not account for what the cost may be with help from insurance (it may be cheaper with your insurance), but the website can give you the price if you did not use any insurance.  ?- You can print the associated coupon and take it with your prescription to the pharmacy.  ?- You may also stop by our office during regular business hours  and pick up a GoodRx coupon card.  ?- If you need your prescription sent electronically to a different pharmacy, notify our office through [facility] or by phone at [phone] option 4. ? ? ? ? ?Si Usted Necesita Algo Despu?s de Su Visita ? ?Tambi?n puede enviarnos un mensaje a trav?s de MyChart. Por lo general respondemos a los mensajes de MyChart en el transcurso de 1 a 2 d?as h?biles. ? ?Para renovar recetas, por favor pida a su farmacia que se ponga en contacto con nuestra oficina. Nuestro n?mero de fax es el [phone]. ? ?Si tiene un asunto urgente cuando la cl?nica est? cerrada y que no puede esperar hasta el siguiente d?a h?bil, puede llamar/localizar a su doctor(a) al n?mero que aparece a continuaci?n.  ? ?Por favor, tenga en cuenta que aunque hacemos todo lo posible para estar disponibles para asuntos urgentes fuera del horario de oficina, no estamos disponibles las 24 horas del d?a, los 7 d?as de la semana.  ? ?Si tiene un problema urgente y no puede comunicarse con nosotros, puede optar por buscar atenci?n  m?dica  en el consultorio de su doctor(a), en una cl?nica privada, en un centro de atenci?n urgente o en una sala de emergencias. ? ?Si tiene una emergencia m?dica, por favor llame inmediatamente al 911

## 2021-12-23 NOTE — Progress Notes (Addendum)
? ?New Patient Visit ? ?Subjective  ?Nathan Anthony is a 66 y.o. male who presents for the following: New Patient (Initial Visit) (2 spots that would like recheck at under arm /Hx of bcc at face 2005 or 2006 at //). ?The patient presents for Upper Body Skin Exam (UBSE) for skin cancer screening and mole check.  The patient has spots, moles and lesions to be evaluated, some may be new or changing and the patient has concerns that these could be cancer. ? ?The following portions of the chart were reviewed this encounter and updated as appropriate:  ? Tobacco  Allergies  Meds  Problems  Med Hx  Surg Hx  Fam Hx   ?  ?Review of Systems:  No other skin or systemic complaints except as noted in HPI or Assessment and Plan. ? ?Objective  ?Well appearing patient in no apparent distress; mood and affect are within normal limits. ? ?An upper body skin examination was performed including scalp, head, eyes, ears, nose, lips, neck, chest, axillae, abdomen, back, bilateral upper extremities, , hands, fingers, fingernails, . All findings within normal limits unless otherwise noted below. ? ?scalp and left cheek x 3, right nasal bridge x 1 (4) ?Erythematous thin papules/macules with gritty scale.  ? ?right postauricular x 1 ?Erythematous stuck-on, waxy papule or plaque ? ? ?Assessment & Plan  ?Actinic keratosis (4) ?scalp and left cheek x 3, right nasal bridge x 1 ?Actinic keratoses are precancerous spots that appear secondary to cumulative UV radiation exposure/sun exposure over time. They are chronic with expected duration over 1 year. A portion of actinic keratoses will progress to squamous cell carcinoma of the skin. It is not possible to reliably predict which spots will progress to skin cancer and so treatment is recommended to prevent development of skin cancer. ? ?Recommend daily broad spectrum sunscreen SPF 30+ to sun-exposed areas, reapply every 2 hours as needed.  ?Recommend staying in the shade or wearing long  sleeves, sun glasses (UVA+UVB protection) and wide brim hats (4-inch brim around the entire circumference of the hat). ?Call for new or changing lesions. ? ?Destruction of lesion - scalp and left cheek x 3, right nasal bridge x 1 ?Complexity: simple   ?Destruction method: cryotherapy   ?Informed consent: discussed and consent obtained   ?Timeout:  patient name, date of birth, surgical site, and procedure verified ?Lesion destroyed using liquid nitrogen: Yes   ?Region frozen until ice ball extended beyond lesion: Yes   ?Outcome: patient tolerated procedure well with no complications   ?Post-procedure details: wound care instructions given   ?Additional details:  Prior to procedure, discussed risks of blister formation, small wound, skin dyspigmentation, or rare scar following cryotherapy. Recommend Vaseline ointment to treated areas while healing. ? ?Inflamed seborrheic keratosis ?right postauricular x 1 ?Irritated and bothers patient  ?Destruction of lesion - right postauricular x 1 ?Complexity: simple   ?Destruction method: cryotherapy   ?Informed consent: discussed and consent obtained   ?Timeout:  patient name, date of birth, surgical site, and procedure verified ?Lesion destroyed using liquid nitrogen: Yes   ?Region frozen until ice ball extended beyond lesion: Yes   ?Outcome: patient tolerated procedure well with no complications   ?Post-procedure details: wound care instructions given   ? ?Lentigines ?- Scattered tan macules ?- Due to sun exposure ?- Benign-appearing, observe ?- Recommend daily broad spectrum sunscreen SPF 30+ to sun-exposed areas, reapply every 2 hours as needed. ?- Call for any changes ? ?Seborrheic Keratoses ?- Stuck-on,  waxy, tan-brown papules and/or plaques  ?- Benign-appearing ?- Discussed benign etiology and prognosis. ?- Observe ?- Call for any changes ? ?Melanocytic Nevi ?- Tan-brown and/or pink-flesh-colored symmetric macules and papules ?- Benign appearing on exam today ?-  Observation ?- Call clinic for new or changing moles ?- Recommend daily use of broad spectrum spf 30+ sunscreen to sun-exposed areas.  ? ?Hemangiomas ?- Red papules ?- Discussed benign nature ?- Observe ?- Call for any changes ? ?Actinic Damage ?- Chronic condition, secondary to cumulative UV/sun exposure ?- diffuse scaly erythematous macules with underlying dyspigmentation ?- Recommend daily broad spectrum sunscreen SPF 30+ to sun-exposed areas, reapply every 2 hours as needed.  ?- Staying in the shade or wearing long sleeves, sun glasses (UVA+UVB protection) and wide brim hats (4-inch brim around the entire circumference of the hat) are also recommended for sun protection.  ?- Call for new or changing lesions. ? ?History of Basal Cell Carcinoma of the Skin ?- No evidence of recurrence today at left cheek 2005 or 2006 ?- Recommend regular full body skin exams ?- Recommend daily broad spectrum sunscreen SPF 30+ to sun-exposed areas, reapply every 2 hours as needed.  ?- Call if any new or changing lesions are noted between office visits ? ?Skin cancer screening performed today. ?Return in about 4 months (around 04/24/2022) for ak followup face and scalp , 1 year tbse. ? ?I, Ruthell Rummage, CMA, am acting as scribe for Sarina Ser, MD. ?Documentation: I have reviewed the above documentation for accuracy and completeness, and I agree with the above. ? ?Sarina Ser, MD ? ? ?

## 2021-12-31 ENCOUNTER — Encounter: Payer: Self-pay | Admitting: Dermatology

## 2021-12-31 NOTE — Addendum Note (Signed)
Addended by: Ralene Bathe on: 12/31/2021 09:35 PM ? ? Modules accepted: Level of Service ? ?

## 2022-04-29 ENCOUNTER — Ambulatory Visit (INDEPENDENT_AMBULATORY_CARE_PROVIDER_SITE_OTHER): Payer: Medicare Other | Admitting: Dermatology

## 2022-04-29 DIAGNOSIS — Z5111 Encounter for antineoplastic chemotherapy: Secondary | ICD-10-CM | POA: Diagnosis not present

## 2022-04-29 DIAGNOSIS — L570 Actinic keratosis: Secondary | ICD-10-CM | POA: Diagnosis not present

## 2022-04-29 DIAGNOSIS — L578 Other skin changes due to chronic exposure to nonionizing radiation: Secondary | ICD-10-CM

## 2022-04-29 NOTE — Progress Notes (Unsigned)
Follow-Up Visit   Subjective  Nathan Anthony is a 66 y.o. male who presents for the following: Actinic Keratosis (4 month follow up - scalp, left cheek, right nasal bridge treated with LN2). The patient has spots, moles and lesions to be evaluated, some may be new or changing and the patient has concerns that these could be cancer.  The following portions of the chart were reviewed this encounter and updated as appropriate:   Tobacco  Allergies  Meds  Problems  Med Hx  Surg Hx  Fam Hx     Review of Systems:  No other skin or systemic complaints except as noted in HPI or Assessment and Plan.  Objective  Well appearing patient in no apparent distress; mood and affect are within normal limits.  A focused examination was performed including face, scalp. Relevant physical exam findings are noted in the Assessment and Plan.  Below nasal bridge x 1, right forehead x 1, left brow x 1, scalp x 4 (7) Erythematous thin papules/macules with gritty scale.    Assessment & Plan   AK (actinic keratosis) (7) Below nasal bridge x 1, right forehead x 1, left brow x 1, scalp x 4  Actinic Damage - Severe, confluent actinic changes with pre-cancerous actinic keratoses  - Severe, chronic, not at goal, secondary to cumulative UV radiation exposure over time - diffuse scaly erythematous macules and papules with underlying dyspigmentation - Discussed Prescription "Field Treatment" for Severe, Chronic Confluent Actinic Changes with Pre-Cancerous Actinic Keratoses Field treatment involves treatment of an entire area of skin that has confluent Actinic Changes (Sun/ Ultraviolet light damage) and PreCancerous Actinic Keratoses by method of PhotoDynamic Therapy (PDT) and/or prescription Topical Chemotherapy agents such as 5-fluorouracil, 5-fluorouracil/calcipotriene, and/or imiquimod.  The purpose is to decrease the number of clinically evident and subclinical PreCancerous lesions to prevent progression to  development of skin cancer by chemically destroying early precancer changes that may or may not be visible.  It has been shown to reduce the risk of developing skin cancer in the treated area. As a result of treatment, redness, scaling, crusting, and open sores may occur during treatment course. One or more than one of these methods may be used and may have to be used several times to control, suppress and eliminate the PreCancerous changes. Discussed treatment course, expected reaction, and possible side effects. - Recommend daily broad spectrum sunscreen SPF 30+ to sun-exposed areas, reapply every 2 hours as needed.  - Staying in the shade or wearing long sleeves, sun glasses (UVA+UVB protection) and wide brim hats (4-inch brim around the entire circumference of the hat) are also recommended. - Call for new or changing lesions.  May consider field treatment in the future.  Destruction of lesion - Below nasal bridge x 1, right forehead x 1, left brow x 1, scalp x 4 Complexity: simple   Destruction method: cryotherapy   Informed consent: discussed and consent obtained   Timeout:  patient name, date of birth, surgical site, and procedure verified Lesion destroyed using liquid nitrogen: Yes   Region frozen until ice ball extended beyond lesion: Yes   Outcome: patient tolerated procedure well with no complications   Post-procedure details: wound care instructions given     Return for Follow up as scheduled, AK follow up.  I, Ashok Cordia, CMA, am acting as scribe for Sarina Ser, MD . Documentation: I have reviewed the above documentation for accuracy and completeness, and I agree with the above.  Sarina Ser, MD

## 2022-04-29 NOTE — Patient Instructions (Signed)
Cryotherapy Aftercare  Wash gently with soap and water everyday.   Apply Vaseline and Band-Aid daily until healed.     Due to recent changes in healthcare laws, you may see results of your pathology and/or laboratory studies on MyChart before the doctors have had a chance to review them. We understand that in some cases there may be results that are confusing or concerning to you. Please understand that not all results are received at the same time and often the doctors may need to interpret multiple results in order to provide you with the best plan of care or course of treatment. Therefore, we ask that you please give us 2 business days to thoroughly review all your results before contacting the office for clarification. Should we see a critical lab result, you will be contacted sooner.   If You Need Anything After Your Visit  If you have any questions or concerns for your doctor, please call our main line at [phone] and press option 4 to reach your doctor's medical assistant. If no one answers, please leave a voicemail as directed and we will return your call as soon as possible. Messages left after 4 pm will be answered the following business day.   You may also send us a message via MyChart. We typically respond to MyChart messages within 1-2 business days.  For prescription refills, please ask your pharmacy to contact our office. Our fax number is [phone].  If you have an urgent issue when the clinic is closed that cannot wait until the next business day, you can page your doctor at the number below.    Please note that while we do our best to be available for urgent issues outside of office hours, we are not available 24/7.   If you have an urgent issue and are unable to reach us, you may choose to seek medical care at your doctor's office, retail clinic, urgent care center, or emergency room.  If you have a medical emergency, please immediately call 911 or go to the  emergency department.  Pager Numbers  - Dr. Kowalski: [phone]  - Dr. Moye: [phone]  - Dr. Stewart: [phone]  In the event of inclement weather, please call our main line at [phone] for an update on the status of any delays or closures.  Dermatology Medication Tips: Please keep the boxes that topical medications come in in order to help keep track of the instructions about where and how to use these. Pharmacies typically print the medication instructions only on the boxes and not directly on the medication tubes.   If your medication is too expensive, please contact our office at [phone] option 4 or send us a message through MyChart.   We are unable to tell what your co-pay for medications will be in advance as this is different depending on your insurance coverage. However, we may be able to find a substitute medication at lower cost or fill out paperwork to get insurance to cover a needed medication.   If a prior authorization is required to get your medication covered by your insurance company, please allow us 1-2 business days to complete this process.  Drug prices often vary depending on where the prescription is filled and some pharmacies may offer cheaper prices.  The website www.goodrx.com contains coupons for medications through different pharmacies. The prices here do not account for what the cost may be with help from insurance (it may be cheaper with your insurance), but the website can   give you the price if you did not use any insurance.  - You can print the associated coupon and take it with your prescription to the pharmacy.  - You may also stop by our office during regular business hours and pick up a GoodRx coupon card.  - If you need your prescription sent electronically to a different pharmacy, notify our office through [place] MyChart or by phone at [phone] option 4.     Si Usted Necesita Algo Despus de Su Visita  Tambin puede  enviarnos un mensaje a travs de MyChart. Por lo general respondemos a los mensajes de MyChart en el transcurso de 1 a 2 das hbiles.  Para renovar recetas, por favor pida a su farmacia que se ponga en contacto con nuestra oficina. Nuestro nmero de fax es el [phone].  Si tiene un asunto urgente cuando la clnica est cerrada y que no puede esperar hasta el siguiente da hbil, puede llamar/localizar a su doctor(a) al nmero que aparece a continuacin.   Por favor, tenga en cuenta que aunque hacemos todo lo posible para estar disponibles para asuntos urgentes fuera del horario de oficina, no estamos disponibles las 24 horas del da, los 7 das de la semana.   Si tiene un problema urgente y no puede comunicarse con nosotros, puede optar por buscar atencin mdica  en el consultorio de su doctor(a), en una clnica privada, en un centro de atencin urgente o en una sala de emergencias.  Si tiene una emergencia mdica, por favor llame inmediatamente al 911 o vaya a la sala de emergencias.  Nmeros de bper  - Dr. Kowalski: [phone]  - Dra. Moye: [phone]  - Dra. Stewart: [phone]  En caso de inclemencias del tiempo, por favor llame a nuestra lnea principal al [phone] para una actualizacin sobre el estado de cualquier retraso o cierre.  Consejos para la medicacin en dermatologa: Por favor, guarde las cajas en las que vienen los medicamentos de uso tpico para ayudarle a seguir las instrucciones sobre dnde y cmo usarlos. Las farmacias generalmente imprimen las instrucciones del medicamento slo en las cajas y no directamente en los tubos del medicamento.   Si su medicamento es muy caro, por favor, pngase en contacto con nuestra oficina llamando al [phone] y presione la opcin 4 o envenos un mensaje a travs de MyChart.   No podemos decirle cul ser su copago por los medicamentos por adelantado ya que esto es diferente dependiendo de la cobertura de su seguro.  Sin embargo, es posible que podamos encontrar un medicamento sustituto a menor costo o llenar un formulario para que el seguro cubra el medicamento que se considera necesario.   Si se requiere una autorizacin previa para que su compaa de seguros cubra su medicamento, por favor permtanos de 1 a 2 das hbiles para completar este proceso.  Los precios de los medicamentos varan con frecuencia dependiendo del lugar de dnde se surte la receta y alguna farmacias pueden ofrecer precios ms baratos.  El sitio web www.goodrx.com tiene cupones para medicamentos de diferentes farmacias. Los precios aqu no tienen en cuenta lo que podra costar con la ayuda del seguro (puede ser ms barato con su seguro), pero el sitio web puede darle el precio si no utiliz ningn seguro.  - Puede imprimir el cupn correspondiente y llevarlo con su receta a la farmacia.  - Tambin puede pasar por nuestra oficina durante el horario de atencin regular y recoger una tarjeta de cupones de GoodRx.  -   Si necesita que su receta se enve electrnicamente a una farmacia diferente, informe a nuestra oficina a travs de MyChart de [place] o por telfono llamando al [phone] y presione la opcin 4.  

## 2022-04-30 ENCOUNTER — Encounter: Payer: Self-pay | Admitting: Dermatology

## 2022-05-12 ENCOUNTER — Other Ambulatory Visit: Payer: Self-pay | Admitting: Internal Medicine

## 2022-05-12 DIAGNOSIS — E782 Mixed hyperlipidemia: Secondary | ICD-10-CM

## 2022-05-12 DIAGNOSIS — I700 Atherosclerosis of aorta: Secondary | ICD-10-CM

## 2022-05-13 ENCOUNTER — Encounter: Payer: Self-pay | Admitting: Urology

## 2022-05-13 ENCOUNTER — Ambulatory Visit (INDEPENDENT_AMBULATORY_CARE_PROVIDER_SITE_OTHER): Payer: TRICARE For Life (TFL) | Admitting: Urology

## 2022-05-13 VITALS — BP 130/79 | HR 80 | Ht 70.0 in | Wt 184.0 lb

## 2022-05-13 DIAGNOSIS — N138 Other obstructive and reflux uropathy: Secondary | ICD-10-CM

## 2022-05-13 DIAGNOSIS — N393 Stress incontinence (female) (male): Secondary | ICD-10-CM | POA: Diagnosis not present

## 2022-05-13 DIAGNOSIS — N401 Enlarged prostate with lower urinary tract symptoms: Secondary | ICD-10-CM

## 2022-05-13 DIAGNOSIS — Z8551 Personal history of malignant neoplasm of bladder: Secondary | ICD-10-CM

## 2022-05-13 LAB — MICROSCOPIC EXAMINATION

## 2022-05-13 LAB — URINALYSIS, COMPLETE
Bilirubin, UA: NEGATIVE
Glucose, UA: NEGATIVE
Ketones, UA: NEGATIVE
Leukocytes,UA: NEGATIVE
Nitrite, UA: NEGATIVE
Protein,UA: NEGATIVE
RBC, UA: NEGATIVE
Specific Gravity, UA: 1.01 (ref 1.005–1.030)
Urobilinogen, Ur: 0.2 mg/dL (ref 0.2–1.0)
pH, UA: 6 (ref 5.0–7.5)

## 2022-05-13 NOTE — Progress Notes (Signed)
   05/13/22   CC:  Chief Complaint  Patient presents with   Cysto    HPI: Nathan Anthony is a 66 y.o. male with a personal history of malignant neoplasm of lateral wall of urinary bladder and BPH with bladder outlet obstruction, who presents today for routine cystoscopy.   In 2019 he underwent a CT urogram which was suspicious of bladder tumor. 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   05/2019 cystoscopy revealed approx 2 mm lesion concerning for low-grade very small recurrence. He elected to undergo in office fulguration.    Cysto 09/06/19 showed a small area <1 mm at the dome of the bladder slightly posterior to the level in the midline which is slightly heaped up but no obvious papillary change.    He underwent HoLEP in 09/2021.  He reports today that his stream is markedly improved.  He denies any urgency or frequency.  He still having variable amount of stress incontinence.  He has good days and bad days.  Some days almost completely dry other days he describes as [address]".  He was doing better after the last visit when he was engaging in pelvic floor exercises on his own.  He since stopped doing them because he has aggravated a left inguinal hernia.  He does not have surgery for at least a year for this particular hernia.  He may be interested in physical therapy referral at this point.   Vitals:   05/13/22 0900  BP: 130/79  Pulse: 80   NED. A&Ox3.   No respiratory distress   Abd soft, NT, ND Normal phallus with bilateral descended testicles  Cystoscopy Procedure Note  Patient identification was confirmed, informed consent was obtained, and patient was prepped using Betadine solution.  Lidocaine jelly was administered per urethral meatus.      Pre-Procedure: - Inspection reveals a normal caliber ureteral meatus.  Procedure: The flexible cystoscope was introduced without difficulty - No urethral strictures/lesions are present. -Large HoLEP defect without any evidence of regrowth, well mucosalized with open bladder neck - Normal bladder neck - Bilateral ureteral orifices identified - Bladder mucosa  reveals no ulcers, tumors, or lesions.  Previous resection site at left lateral bladder wall consistent with stellate scar. - No bladder stones - mild trabeculation  Retroflexion unremarkable, no evidence of residual median lobe tissue  Post-Procedure: - Patient tolerated the procedure well  Assessment/ Plan:  History of bladder cancer - NED  - Follow-up in 6 months for repeat cystoscopy   1. BPH with obstruction/lower urinary tract symptoms -Improved obstructive urinary symptoms, see below - Urinalysis, Complete - PSA; Future - PSA  3. Stress incontinence We discussed his options, was improving with physical exercises but had a setback with this hernia  I offered him a referral to general surgery for consideration of repair which he declines, not interested in having another surgery this year we did go ahead and discussed the warning symptoms  He may be interested in working with our physical therapist to see if there is any particular exercises that will not aggravate his hernia but may improve his incontinence - Ambulatory referral to Physical Therapy   Follow-up in 6 months with cystoscopy  Hollice Espy, MD

## 2022-05-14 LAB — PSA: Prostate Specific Ag, Serum: 0.2 ng/mL (ref 0.0–4.0)

## 2022-05-14 NOTE — Telephone Encounter (Signed)
Requested medication (s) are due for refill today: Yes  Requested medication (s) are on the active medication list: Yes  Last refill:  11/06/21  Future visit scheduled: No  Notes to clinic:  Pt. Needs appointment. No answer on phone, unable to leave a message.    Requested Prescriptions  Pending Prescriptions Disp Refills   rosuvastatin (CRESTOR) 20 MG tablet [Pharmacy Med Name: ROSUVASTATIN TABS '20MG'$ ] 90 tablet 3    Sig: TAKE 1 TABLET DAILY     Cardiovascular:  Antilipid - Statins 2 Failed - 05/12/2022  9:47 PM      Failed - Valid encounter within last 12 months    Recent Outpatient Visits           1 year ago Annual physical exam   [facility], Jesse Sans, MD   2 years ago Annual physical exam   [facility], Jesse Sans, MD   2 years ago Cellulitis of left lower extremity   [facility], Jesse Sans, MD   3 years ago Annual physical exam   [facility], Jesse Sans, MD   4 years ago Prostate cancer screening   [facility] Jesse Sans, MD       Future Appointments             In 7 months Ralene Bathe, MD [place]            Failed - Lipid Panel in normal range within the last 12 months    Cholesterol, Total  Date Value Ref Range Status  05/10/2021 141 100 - 199 mg/dL Final   LDL Chol Calc (NIH)  Date Value Ref Range Status  05/10/2021 71 0 - 99 mg/dL Final   HDL  Date Value Ref Range Status  05/10/2021 28 (L) >39 mg/dL Final   Triglycerides  Date Value Ref Range Status  05/10/2021 259 (H) 0 - 149 mg/dL Final         Passed - Cr in normal range and within 360 days    Creatinine, Ser  Date Value Ref Range Status  09/12/2021 0.75 0.61 - 1.24 mg/dL Final          Passed - Patient is not pregnant

## 2022-05-15 ENCOUNTER — Ambulatory Visit: Payer: Medicare Other | Attending: Urology | Admitting: Physical Therapy

## 2022-05-15 ENCOUNTER — Encounter: Payer: Self-pay | Admitting: Physical Therapy

## 2022-05-15 DIAGNOSIS — N393 Stress incontinence (female) (male): Secondary | ICD-10-CM | POA: Insufficient documentation

## 2022-05-15 DIAGNOSIS — M533 Sacrococcygeal disorders, not elsewhere classified: Secondary | ICD-10-CM | POA: Diagnosis present

## 2022-05-15 DIAGNOSIS — R2689 Other abnormalities of gait and mobility: Secondary | ICD-10-CM | POA: Insufficient documentation

## 2022-05-15 DIAGNOSIS — R278 Other lack of coordination: Secondary | ICD-10-CM | POA: Insufficient documentation

## 2022-05-15 DIAGNOSIS — M5459 Other low back pain: Secondary | ICD-10-CM | POA: Diagnosis present

## 2022-05-15 NOTE — Therapy (Unsigned)
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[place])    Cancer ([place])    bladder   Chronic obstructive pulmonary disease ([place]) 5/36/6440   Complication of anesthesia    Asthmatic episode when awakening from colonoscopy   DISH (diffuse idiopathic skeletal hyperostosis)    neck pain   GERD (gastroesophageal reflux disease)    Hyperlipidemia    Hyperlipidemia    Inguinal hernia    Left Side   Nasal congestion    nasal polyps   Osteopenia    Osteoporosis    RA (rheumatoid arthritis) ([facility])    shoulders and hands   Rheumatoid arthritis ([place])    Sciatica    Sciatica    Sciatica    Tinnitus aurium, left    Past Surgical History:  Procedure Laterality Date   BASAL CELL CARCINOMA EXCISION     COLONOSCOPY  06/06/2010   COLONOSCOPY WITH PROPOFOL N/A 06/08/2020   Procedure: COLONOSCOPY WITH PROPOFOL;  Surgeon: Lucilla Lame, MD;  Location: [employer];  Service: Endoscopy;  Laterality: N/A;  priority 4   ETHMOIDECTOMY Bilateral 07/03/2020   Procedure: ETHMOIDECTOMY;  Surgeon: Clyde Canterbury, MD;  Location: [place];  Service: ENT;  Laterality: Bilateral;   FRONTAL SINUS EXPLORATION Bilateral 07/03/2020   Procedure: FRONTAL SINUS EXPLORATION;  Surgeon: Clyde Canterbury, MD;  Location: [place];  Service: ENT;  Laterality: Bilateral;   HERNIA REPAIR     HOLEP-LASER ENUCLEATION OF THE PROSTATE WITH MORCELLATION N/A 09/23/2021   Procedure:  HOLEP-LASER ENUCLEATION OF THE PROSTATE WITH MORCELLATION;  Surgeon: Hollice Espy, MD;  Location: [facility];  Service: Urology;  Laterality: N/A;   IMAGE GUIDED SINUS SURGERY N/A 07/03/2020   Procedure: IMAGE GUIDED SINUS SURGERY;  Surgeon: Clyde Canterbury, MD;  Location: [place];  Service: ENT;  Laterality: N/A;  need stryker disk PLACE DISK ON OR CHARGE NURSE DESK 10-01 KP Another disk on OR charge nurse desk 10.14.21 DS   MAXILLARY ANTROSTOMY Bilateral 07/03/2020   Procedure: MAXILLARY ANTROSTOMY with tissue removal;  Surgeon: Clyde Canterbury, MD;  Location: [place];  Service: ENT;  Laterality: Bilateral;   NASAL SEPTOPLASTY W/ TURBINOPLASTY Bilateral 07/03/2020   Procedure: NASAL SEPTOPLASTY;  Surgeon: Clyde Canterbury, MD;  Location: [place];  Service: ENT;  Laterality: Bilateral;   SPHENOIDECTOMY Bilateral 07/03/2020   Procedure: Coralee Pesa;  Surgeon: Clyde Canterbury, MD;  Location: [place];  Service: ENT;  Laterality: Bilateral;   TRANSURETHRAL RESECTION OF BLADDER TUMOR WITH MITOMYCIN-C N/A 06/21/2018   Procedure: TRANSURETHRAL RESECTION OF BLADDER TUMOR WITH Gemcitabine;  Surgeon: Hollice Espy, MD;  Location: [facility];  Service: Urology;  Laterality: N/A;   VARICOCELECTOMY     VARICOCELECTOMY     Patient Active Problem List   Diagnosis Date Noted   Former smoker 10/10/2020   Encounter for  screening colonoscopy    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[place]) 05/03/2020   Abdominal aortic atherosclerosis ([place]) 04/11/2020   Acquired deviated nasal septum 09/23/2019   BPH with obstruction/lower urinary tract symptoms 04/22/2018   DISH (diffuse idiopathic skeletal hyperostosis) 11/14/2016   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[place]) 02/06/2016   Unilateral inguinal hernia without obstruction or gangrene 05/22/2015   GERD (gastroesophageal reflux disease) 11/15/2014   Hyperlipidemia 11/15/2014   COPD mixed type ([place]) 11/15/2014   Osteoporosis  without current pathological fracture 11/15/2014   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 Currently, pt is wearing 1 pad. When he goes out, he wears a Guard pad.  Pt tried doing the kegels but it started to aggravate his L inguinal hernia. Pt had R inguinal hernia repair. Pt had performed sit ups and crunches in the [place] for 20 years. Pt does lots of yard work and remodeling a house.  Pt used to walk 3-4 miles a day but he has not been walking. Pt helps to care 18 month granddtr ( 26 lbs) and lifts her and notices leakage when getting to the ground to pay with her.   2) CLBP: Located at R and middle of the low back. 10/10 pain occurs when he sits for a 3 hours. On average , pain is 0-2 /10.   3) upper back pain between shoulder blades:  when he is doing tasks with his hands overhead.   3/10 constantly. With washing dishes 8/10.   Pt has DISH (diffuse idiopathic skeletal hyperostosis)   PERTINENT HISTORY:  Hx of bladder CA, HOLEP, R inguinal hernia repair, L inguinal hernia, skin CA, Rheumatoid arthritis, COPD, and additional comorbidities: DISH (diffuse idiopathic skeletal hyperostosis)  PAIN:  Are you having pain?    PRECAUTIONS: None  WEIGHT BEARING RESTRICTIONS No  FALLS:  Has patient fallen in last 6 months? No  LIVING ENVIRONMENT: Lives with: lives alone Lives in: House/apartment Stairs: yes  Has following equipment at home: no   OCCUPATION:   retired  [employer]  PLOF: Independent  PATIENT GOALS   Wear pads instead of Guard pads  OBJECTIVE:     [facility] PT Assessment - 05/16/22 1224       Sit to Stand   Comments breathholding      Floor to Stand   Comments half kneeling with poor anterior knee alignment, breatholding during new t/f      Palpation   Spinal mobility sidebend B causes LBP    Palpation comment L iliac crest higher             [facility] Adult PT Treatment/Exercise - 05/16/22 1224       Ambulation/Gait   Gait Comments 0.9 m/s, limited trunk rotation, L hip higher, decreased pelvic rotation, heel striking      Therapeutic Activites    Therapeutic Activities Other Therapeutic Activities    Other Therapeutic Activities see pt education      Neuro Re-ed    Neuro Re-ed Details  cued for body mechanics with floor <> stand t/f to play with dtr, cued for sit to stand              HOME EXERCISE PROGRAM: See pt instruction section    ASSESSMENT:  CLINICAL IMPRESSION:   Pt is a  66  yo  who presents with SUI 2/2 HOLEP surgery and  CLBP  which impact QOL, ADL, fitness, and community activities.   Pt's musculoskeletal assessment revealed uneven pelvic girdle and shoulder height, asymmetries to gait pattern, limited spinal /pelvic mobility, dyscoordination and strength of pelvic floor mm, poor body mechanics which places strain on the abdominal/pelvic floor mm. These are deficits that indicate an ineffective intraabdominal pressure system associated with increased risk for pt's Sx.    Pt was provided education on etiology of Sx with anatomy, physiology explanation with images along with the benefits of customized pelvic PT Tx based on pt's medical conditions and musculoskeletal deficits.  Explained the physiology of deep core mm coordination and roles of pelvic floor function in urination, defecation, sexual function, and postural control with deep core mm system.    Regional interdependent  approaches will yield greater benefits in pt's POC.  Following Tx today which pt tolerated without complaints, pt demo'd proper technique for stand <> floor t/f which is a meaningful task for pt to pplay with 28 month old granddtr. Pt reported no leakage with this technique. Plan to address his uneven pelvic alignment at next session.    Pt benefit from skilled PT.    OBJECTIVE IMPAIRMENTS decreased activity tolerance, decreased coordination, decreased endurance, decreased mobility, difficulty walking, decreased ROM, decreased strength, decreased safety awareness, hypomobility, increased muscle spasms, impaired flexibility, improper body mechanics, postural dysfunction, and pain.    ACTIVITY LIMITATIONS  self-care,  home chores,    PARTICIPATION LIMITATIONS:  community, playing with granddtr on floor  (leakage occurs)    PERSONAL FACTORS  Hx of bladder CA, HOLEP, R inguinal hernia repair, L inguinal hernia, skin CA, DISH, Rheumatoid arthritis, COPD, and additional comorbidities are also affecting patient's functional outcome.    REHAB POTENTIAL: Good   CLINICAL DECISION MAKING: Evolving/moderate complexity   EVALUATION COMPLEXITY: Moderate    PATIENT EDUCATION:    Education details: Showed pt anatomy images. Explained muscles attachments/ connection, physiology of deep core system/ spinal- thoracic-pelvis-lower kinetic chain as they relate to pt's presentation, Sx, and past Hx. Explained what and how these areas of deficits need to be restored to balance and function    See Therapeutic activity / neuromuscular re-education section  Answered pt's questions.   Person educated: Patient Education method: Explanation, Demonstration,  Tactile cues, Verbal cues, and Handouts Education comprehension: verbalized understanding, returned demonstration, verbal cues required, tactile cues required, and needs further education     PLAN: PT FREQUENCY: 1x/week   PT DURATION: 10 weeks    PLANNED INTERVENTIONS: Therapeutic exercises, Therapeutic activity, Neuromuscular re-education, Balance training, Gait training, Patient/Family education, Self Care, Joint mobilization, Spinal mobilization, Moist heat, Taping, and Manual therapy.   PLAN FOR NEXT SESSION: See clinical impression for plan     GOALS: Goals reviewed with patient? Yes  SHORT TERM GOALS: Target date: 06/13/2022    Pt will demo IND with HEP                    Baseline: Not IND            Goal status: INITIAL   LONG TERM GOALS: Target date:  07/24/2022     1.Pt will demo proper deep core coordination without chest breathing and optimal excursion of diaphragm/pelvic floor in order to promote spinal stability and pelvic floor function  Baseline: dyscoordination Goal status: INITIAL  2.  Pt will demo > 5 pt change on FOTO  to improve QOL and function  Urinary Problem baseline: 54 pts  PFDI Urinary baseline:  29pts  ____  Lumbar baseline:  57 pts  Goal status: INITIAL  3.  Pt will demo proper body mechanics in against gravity tasks and ADLs  work tasks, fitness  to minimize straining pelvic floor / back                  Baseline: not IND, improper form that places strain on pelvic floor .    Leaking  with stand <> floor                Goal status: INITIAL    4. Pt will increase gait speed > 1.2 m/s with improved mechanics to return to walking 2 mile  Baseline:  0.9 m/s , limited trunk mobility, L hip higher Goal status: INITIAL    5. Pt will report decreased use of Guard pads and wearing dribble pads when he goes out in community  Baseline: Guard pad when he goes out  Goal status: INITIAL   6. Pt will report decreased pain at mid back by < 50% when washing dishes and also decreased LBP < 50% when he drives for 4 hours Oct 30th               Baseline:   washing dishes : midback pain:  03/08/09                         Driving  4 hours: low back pain :  7/10   Goal status:  INITIAL     Jerl Mina, PT 05/16/2022, 12:26 PM

## 2022-05-15 NOTE — Patient Instructions (Addendum)
Avoid straining pelvic floor, abdominal muscles , spine  Use log rolling technique instead of getting out of bed with your neck or the sit-up     Log rolling into and out of bed   Log rolling into and out of bed If getting out of bed on R side, Bent knees, scoot hips/ shoulder to L  Raise R arm completely overhead, rolling onto armpit  Then lower bent knees to bed to get into complete side lying position  Then drop legs off bed, and push up onto R elbow/forearm, and use L hand to push onto the bed  __   Proper body mechanics with getting out of a chair to decrease strain  on back &pelvic floor   Avoid holding your breath when Getting out of the chair:  Scoot to front part of chair chair Heels behind knees, feet are hip width apart, nose over toes  Inhale like you are smelling roses Exhale to stand   __  Sitting with feet on ground, four points of contact Catch yourself crossing ankles and thighs  __   Transition from standing to floor if you have hypertension or can not have your head lower than your heart  Wide squat in front of a CHAIR  Forearms onto the chair Lower knees down into double kneeling   floor   To get up Walk on your knees to the front of chair again Forearms onto the chair Inhale, exhale, pushing onto the forearms to life  lifts hips up, kneeping knees bent hands on thighs, then hips then pause HERE  to avoid (moving too quickly up/ blood rush) -->  knees glide forward and roll  Hips up instead of hinging spine up

## 2022-05-20 ENCOUNTER — Ambulatory Visit: Payer: Medicare Other | Admitting: Physical Therapy

## 2022-05-20 DIAGNOSIS — M533 Sacrococcygeal disorders, not elsewhere classified: Secondary | ICD-10-CM | POA: Diagnosis not present

## 2022-05-20 DIAGNOSIS — M5459 Other low back pain: Secondary | ICD-10-CM

## 2022-05-20 DIAGNOSIS — R2689 Other abnormalities of gait and mobility: Secondary | ICD-10-CM

## 2022-05-20 DIAGNOSIS — R278 Other lack of coordination: Secondary | ICD-10-CM

## 2022-05-20 NOTE — Patient Instructions (Addendum)
Reclined twist for hips and side of the hips/ legs  Lay on your back, knees bend Scoot hips to the R , leave shoulders in place Wobble kneesto the L side and middle ( before the point of pain , less angle)    10 reps   Repeat other side   ___   On your back   -angel wings , dragging arms on bed  10 reps    Cat cow, dragging the palms on thigh , arching back and then chest lifts  10 reps

## 2022-05-20 NOTE — Therapy (Signed)
OUTPATIENT PHYSICAL THERAPY Treatment    Patient Name: Nathan Anthony MRN: [id number] DOB:Apr 06, 1956, 66 y.o., male Today's Date: 05/20/2022   PT End of Session - 05/20/22 1422     Visit Number 2    Number of Visits 10    Date for PT Re-Evaluation 07/24/22    PT Start Time 1415    PT Stop Time 1500    PT Time Calculation (min) 45 min    Activity Tolerance Patient tolerated treatment well    Behavior During Therapy [facility] for tasks assessed/performed             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[place])    Cancer ([place])    bladder   Chronic obstructive pulmonary disease ([place]) 7/61/6073   Complication of anesthesia    Asthmatic episode when awakening from colonoscopy   DISH (diffuse idiopathic skeletal hyperostosis)    neck pain   GERD (gastroesophageal reflux disease)    Hyperlipidemia    Hyperlipidemia    Inguinal hernia    Left Side   Nasal congestion    nasal polyps   Osteopenia    Osteoporosis    RA (rheumatoid arthritis) (HCC)    shoulders and hands   Rheumatoid arthritis ([place])    Sciatica    Sciatica    Sciatica    Tinnitus aurium, left    Past Surgical History:  Procedure Laterality Date   BASAL CELL CARCINOMA EXCISION     COLONOSCOPY  06/06/2010   COLONOSCOPY WITH PROPOFOL N/A 06/08/2020   Procedure: COLONOSCOPY WITH PROPOFOL;  Surgeon: Lucilla Lame, MD;  Location: [place];  Service: Endoscopy;  Laterality: N/A;  priority 4   ETHMOIDECTOMY Bilateral 07/03/2020   Procedure: ETHMOIDECTOMY;  Surgeon: Clyde Canterbury, MD;  Location: [facility] [facility];  Service: ENT;  Laterality: Bilateral;   FRONTAL SINUS EXPLORATION Bilateral 07/03/2020   Procedure: FRONTAL SINUS EXPLORATION;  Surgeon: Clyde Canterbury, MD;  Location: [place];  Service: ENT;  Laterality: Bilateral;   HERNIA REPAIR     HOLEP-LASER ENUCLEATION OF THE PROSTATE WITH MORCELLATION N/A 09/23/2021   Procedure:  HOLEP-LASER ENUCLEATION OF THE PROSTATE WITH MORCELLATION;  Surgeon: Hollice Espy, MD;  Location: [facility];  Service: Urology;  Laterality: N/A;   IMAGE GUIDED SINUS SURGERY N/A 07/03/2020   Procedure: IMAGE GUIDED SINUS SURGERY;  Surgeon: Clyde Canterbury, MD;  Location: [place];  Service: ENT;  Laterality: N/A;  need stryker disk PLACE DISK ON OR CHARGE NURSE DESK 10-01 KP Another disk on OR charge nurse desk 10.14.21 DS   MAXILLARY ANTROSTOMY Bilateral 07/03/2020   Procedure: MAXILLARY ANTROSTOMY with tissue removal;  Surgeon: Clyde Canterbury, MD;  Location: [place];  Service: ENT;  Laterality: Bilateral;   NASAL SEPTOPLASTY W/ TURBINOPLASTY Bilateral 07/03/2020   Procedure: NASAL SEPTOPLASTY;  Surgeon: Clyde Canterbury, MD;  Location: [place];  Service: ENT;  Laterality: Bilateral;   SPHENOIDECTOMY Bilateral 07/03/2020   Procedure: Coralee Pesa;  Surgeon: Clyde Canterbury, MD;  Location: [place];  Service: ENT;  Laterality: Bilateral;   TRANSURETHRAL RESECTION OF BLADDER TUMOR WITH MITOMYCIN-C N/A 06/21/2018   Procedure: TRANSURETHRAL RESECTION OF BLADDER TUMOR WITH Gemcitabine;  Surgeon: Hollice Espy, MD;  Location: [facility];  Service: Urology;  Laterality: N/A;   VARICOCELECTOMY     VARICOCELECTOMY     Patient Active Problem List   Diagnosis Date Noted   Former smoker 10/10/2020   Encounter  for screening colonoscopy    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[place]) 05/03/2020   Abdominal aortic atherosclerosis ([place]) 04/11/2020   Acquired deviated nasal septum 09/23/2019   BPH with obstruction/lower urinary tract symptoms 04/22/2018   DISH (diffuse idiopathic skeletal hyperostosis) 11/14/2016   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[place]) 02/06/2016   Unilateral inguinal hernia without obstruction or gangrene 05/22/2015   GERD (gastroesophageal reflux disease) 11/15/2014   Hyperlipidemia 11/15/2014   COPD mixed type ([place]) 11/15/2014   Osteoporosis  without current pathological fracture 11/15/2014   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[place]) 11/15/2014    PCP: [name]  REFERRING PROVIDER: Erlene Quan   REFERRING DIAG: Stress incontinence  Rationale for Evaluation and Treatment Rehabilitation  THERAPY DIAG:   ONSET DATE: SUI started after HOLEP Jan 2023   SUBJECTIVE:                                                                                                                                                                                           SUBJECTIVE STATEMENT: 1) SUI :  Pt noticed less leakage with new technique to the floor   2) CLBP:     3) upper back pain between shoulder blades:   Pt has DISH (diffuse idiopathic skeletal hyperostosis)   PERTINENT HISTORY:  Hx of bladder CA, HOLEP, R inguinal hernia repair, L inguinal hernia, skin CA, Rheumatoid arthritis, COPD, and additional comorbidities: DISH (diffuse idiopathic skeletal hyperostosis)  PAIN:  Are you having pain?    PRECAUTIONS: None  WEIGHT BEARING RESTRICTIONS No  FALLS:  Has patient fallen in last 6 months? No  LIVING ENVIRONMENT: Lives with: lives alone Lives in: House/apartment Stairs: yes  Has following equipment at home: no   OCCUPATION:   retired [employer]  PLOF: Independent  PATIENT GOALS   Wear pads instead of Guard pads    OBJECTIVE:    [facility] PT Assessment - 05/20/22 1425       Palpation   Spinal mobility tightness along intercostals , paraspinals, interspinals B    Palpation comment Levelled pelvis , shoulders              [facility] Adult PT Treatment/Exercise - 05/20/22 1456       Neuro Re-ed    Neuro Re-ed Details  cued for HEP      Modalities   Modalities Moist Heat      Moist Heat Therapy   Number Minutes Moist Heat 5 Minutes    Moist Heat Location --   back , unbilled     Manual Therapy   Manual therapy comments STM/MWM at problem areas noted  in  assessment to promote segmental rotation of spine and address forwward head posture, optimize diaphramgatic excursion            HOME EXERCISE PROGRAM: See pt instruction section    ASSESSMENT:  CLINICAL IMPRESSION:   Pt dem'd good carry over with stand <> floor t/f.    Addressed pt's tight paraspinals/ interspinals, limited trunk segmental mobility.  Modified HEP due DISH. Pt demo'd improved diaphragm excursion and less forward head posture post Tx. Continue to address tight back mm and promote more mobility at thorax and progress to deep core strengthening next session.   Pt benefits from skilled PT.    OBJECTIVE IMPAIRMENTS decreased activity tolerance, decreased coordination, decreased endurance, decreased mobility, difficulty walking, decreased ROM, decreased strength, decreased safety awareness, hypomobility, increased muscle spasms, impaired flexibility, improper body mechanics, postural dysfunction, and pain.    ACTIVITY LIMITATIONS  self-care,  home chores,    PARTICIPATION LIMITATIONS:  community, playing with granddtr on floor  (leakage occurs)    PERSONAL FACTORS  Hx of bladder CA, HOLEP, R inguinal hernia repair, L inguinal hernia, skin CA, DISH, Rheumatoid arthritis, COPD, and additional comorbidities are also affecting patient's functional outcome.    REHAB POTENTIAL: Good   CLINICAL DECISION MAKING: Evolving/moderate complexity   EVALUATION COMPLEXITY: Moderate    PATIENT EDUCATION:    Education details: Showed pt anatomy images. Explained muscles attachments/ connection, physiology of deep core system/ spinal- thoracic-pelvis-lower kinetic chain as they relate to pt's presentation, Sx, and past Hx. Explained what and how these areas of deficits need to be restored to balance and function    See Therapeutic activity / neuromuscular re-education section  Answered pt's questions.   Person educated: Patient Education method: Explanation, Demonstration,  Tactile cues, Verbal cues, and Handouts Education comprehension: verbalized understanding, returned demonstration, verbal cues required, tactile cues required, and needs further education     PLAN: PT FREQUENCY: 1x/week   PT DURATION: 10 weeks   PLANNED INTERVENTIONS: Therapeutic exercises, Therapeutic activity, Neuromuscular re-education, Balance training, Gait training, Patient/Family education, Self Care, Joint mobilization, Spinal mobilization, Moist heat, Taping, and Manual therapy.   PLAN FOR NEXT SESSION: See clinical impression for plan     GOALS: Goals reviewed with patient? Yes  SHORT TERM GOALS: Target date: 06/13/2022    Pt will demo IND with HEP                    Baseline: Not IND            Goal status: INITIAL   LONG TERM GOALS: Target date:  07/24/2022     1.Pt will demo proper deep core coordination without chest breathing and optimal excursion of diaphragm/pelvic floor in order to promote spinal stability and pelvic floor function  Baseline: dyscoordination Goal status: INITIAL  2.  Pt will demo > 5 pt change on FOTO  to improve QOL and function  Urinary Problem baseline: 54 pts  PFDI Urinary baseline:  29pts  ____  Lumbar baseline:  57 pts  Goal status: INITIAL  3.  Pt will demo proper body mechanics in against gravity tasks and ADLs  work tasks, fitness  to minimize straining pelvic floor / back                  Baseline: not IND, improper form that places strain on pelvic floor .    Leaking  with stand <> floor  Goal status: INITIAL    4. Pt will increase gait speed > 1.2 m/s with improved mechanics to return to walking 2 mile  Baseline:  0.9 m/s , limited trunk mobility, L hip higher Goal status: INITIAL    5. Pt will report decreased use of Guard pads and wearing dribble pads when he goes out in community  Baseline: Guard pad when he goes out  Goal status: INITIAL   6. Pt will report decreased pain at mid back by <  50% when washing dishes and also decreased LBP < 50% when he drives for 4 hours Oct 30th               Baseline:   washing dishes : midback pain:  03/08/09                         Driving  4 hours: low back pain :  7/10   Goal status: INITIAL     Jerl Mina, PT 05/20/2022, 3:04 PM

## 2022-05-27 ENCOUNTER — Ambulatory Visit: Payer: Medicare Other | Admitting: Physical Therapy

## 2022-05-27 DIAGNOSIS — M5459 Other low back pain: Secondary | ICD-10-CM

## 2022-05-27 DIAGNOSIS — M533 Sacrococcygeal disorders, not elsewhere classified: Secondary | ICD-10-CM

## 2022-05-27 DIAGNOSIS — R278 Other lack of coordination: Secondary | ICD-10-CM

## 2022-05-27 DIAGNOSIS — R2689 Other abnormalities of gait and mobility: Secondary | ICD-10-CM

## 2022-05-27 NOTE — Therapy (Signed)
OUTPATIENT PHYSICAL THERAPY Treatment    Patient Name: Nathan Anthony MRN: [id number] DOB:1956-06-13, 67 y.o., male Today's Date: 05/27/2022   PT End of Session - 05/27/22 1505     Visit Number 3    Number of Visits 10    Date for PT Re-Evaluation 07/24/22    PT Start Time 1500    PT Stop Time 1545    PT Time Calculation (min) 45 min    Activity Tolerance Patient tolerated treatment well    Behavior During Therapy [facility] for tasks assessed/performed             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[place])    Cancer ([place])    bladder   Chronic obstructive pulmonary disease ([place]) 02/18/5092   Complication of anesthesia    Asthmatic episode when awakening from colonoscopy   DISH (diffuse idiopathic skeletal hyperostosis)    neck pain   GERD (gastroesophageal reflux disease)    Hyperlipidemia    Hyperlipidemia    Inguinal hernia    Left Side   Nasal congestion    nasal polyps   Osteopenia    Osteoporosis    RA (rheumatoid arthritis) (HCC)    shoulders and hands   Rheumatoid arthritis ([place])    Sciatica    Sciatica    Sciatica    Tinnitus aurium, left    Past Surgical History:  Procedure Laterality Date   BASAL CELL CARCINOMA EXCISION     COLONOSCOPY  06/06/2010   COLONOSCOPY WITH PROPOFOL N/A 06/08/2020   Procedure: COLONOSCOPY WITH PROPOFOL;  Surgeon: Lucilla Lame, MD;  Location: [place];  Service: Endoscopy;  Laterality: N/A;  priority 4   ETHMOIDECTOMY Bilateral 07/03/2020   Procedure: ETHMOIDECTOMY;  Surgeon: Clyde Canterbury, MD;  Location: [place];  Service: ENT;  Laterality: Bilateral;   FRONTAL SINUS EXPLORATION Bilateral 07/03/2020   Procedure: FRONTAL SINUS EXPLORATION;  Surgeon: Clyde Canterbury, MD;  Location: [place];  Service: ENT;  Laterality: Bilateral;   HERNIA REPAIR     HOLEP-LASER ENUCLEATION OF THE PROSTATE WITH MORCELLATION N/A 09/23/2021   Procedure:  HOLEP-LASER ENUCLEATION OF THE PROSTATE WITH MORCELLATION;  Surgeon: Hollice Espy, MD;  Location: [facility];  Service: Urology;  Laterality: N/A;   IMAGE GUIDED SINUS SURGERY N/A 07/03/2020   Procedure: IMAGE GUIDED SINUS SURGERY;  Surgeon: Clyde Canterbury, MD;  Location: [place];  Service: ENT;  Laterality: N/A;  need stryker disk PLACE DISK ON OR CHARGE NURSE DESK 10-01 KP Another disk on OR charge nurse desk 10.14.21 DS   MAXILLARY ANTROSTOMY Bilateral 07/03/2020   Procedure: MAXILLARY ANTROSTOMY with tissue removal;  Surgeon: Clyde Canterbury, MD;  Location: [place];  Service: ENT;  Laterality: Bilateral;   NASAL SEPTOPLASTY W/ TURBINOPLASTY Bilateral 07/03/2020   Procedure: NASAL SEPTOPLASTY;  Surgeon: Clyde Canterbury, MD;  Location: [place];  Service: ENT;  Laterality: Bilateral;   SPHENOIDECTOMY Bilateral 07/03/2020   Procedure: Coralee Pesa;  Surgeon: Clyde Canterbury, MD;  Location: [place];  Service: ENT;  Laterality: Bilateral;   TRANSURETHRAL RESECTION OF BLADDER TUMOR WITH MITOMYCIN-C N/A 06/21/2018   Procedure: TRANSURETHRAL RESECTION OF BLADDER TUMOR WITH Gemcitabine;  Surgeon: Hollice Espy, MD;  Location: [facility];  Service: Urology;  Laterality: N/A;   VARICOCELECTOMY     VARICOCELECTOMY     Patient Active Problem List   Diagnosis Date Noted   Former smoker 10/10/2020   Encounter  for screening colonoscopy    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[place]) 05/03/2020   Abdominal aortic atherosclerosis ([place]) 04/11/2020   Acquired deviated nasal septum 09/23/2019   BPH with obstruction/lower urinary tract symptoms 04/22/2018   DISH (diffuse idiopathic skeletal hyperostosis) 11/14/2016   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[place]) 02/06/2016   Unilateral inguinal hernia without obstruction or gangrene 05/22/2015   GERD (gastroesophageal reflux disease) 11/15/2014   Hyperlipidemia 11/15/2014   COPD mixed type ([place]) 11/15/2014   Osteoporosis  without current pathological fracture 11/15/2014   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[place]) 11/15/2014    PCP: [name]  REFERRING PROVIDER: Erlene Quan   REFERRING DIAG: Stress incontinence  Rationale for Evaluation and Treatment Rehabilitation  THERAPY DIAG:   ONSET DATE: SUI started after HOLEP Jan 2023   SUBJECTIVE:                                                                                                                                                                                           SUBJECTIVE STATEMENT: 1) SUI :    2) CLBP:    No pain last week    3) upper back pain between shoulder blades:  Pt experienced pain after last session from lying on his side and could not move his arms. Pt took prednisone which helped. Pt's RA is more in his shoulders and hs avoids sleeping on his side.   Pt has DISH (diffuse idiopathic skeletal hyperostosis)   PERTINENT HISTORY:  Hx of bladder CA, HOLEP, R inguinal hernia repair, L inguinal hernia, skin CA, Rheumatoid arthritis, COPD, and additional comorbidities: DISH (diffuse idiopathic skeletal hyperostosis)  PAIN:  Are you having pain?    PRECAUTIONS: None  WEIGHT BEARING RESTRICTIONS No  FALLS:  Has patient fallen in last 6 months? No  LIVING ENVIRONMENT: Lives with: lives alone Lives in: House/apartment Stairs: yes  Has following equipment at home: no   OCCUPATION:   retired [employer]  PLOF: Independent  PATIENT GOALS   Wear pads instead of Guard pads    OBJECTIVE:     [facility] PT Assessment - 05/27/22 1509       Observation/Other Assessments   Observations less rounded shoulders, more lumbar mobility      Palpation   Spinal mobility tightness along intercostals , paraspinals, interspinals B      Ambulation/Gait   Gait Comments 1.14 m/s, more rotation of trunk             [facility] Adult PT Treatment/Exercise - 05/27/22 1509       Neuro Re-ed     Neuro Re-ed Details  cued  for spinal twist seated yoga ( see pt instructions)      Modalities   Modalities Moist Heat      Moist Heat Therapy   Number Minutes Moist Heat 5 Minutes    Moist Heat Location --   throacic     Manual Therapy   Manual therapy comments STM/MWM at problem areas noted in assessment to promote segmental rotation of spine and address forwward head posture, optimize diaphramgatic excursion                HOME EXERCISE PROGRAM: See pt instruction section    ASSESSMENT:  CLINICAL IMPRESSION:   Pt dem'd good carry over with more lumbar segmental mobility. Continued to  addressed pt's tight paraspinals/ interspinals, limited trunk segmental mobility.  Modified HEP due DISH and avoided sidelying position due to increased pain at arms post Tx from sidelying position.  Today, following Tx, pt demo'd improved diaphragm excursion and less forward head posture post Tx. Continue to address tight back mm and promote more mobility at thorax and progress to deep core strengthening and scapular retraction strengthening at next session. Added spinal rotation HEP today.   Pt benefits from skilled PT.    OBJECTIVE IMPAIRMENTS decreased activity tolerance, decreased coordination, decreased endurance, decreased mobility, difficulty walking, decreased ROM, decreased strength, decreased safety awareness, hypomobility, increased muscle spasms, impaired flexibility, improper body mechanics, postural dysfunction, and pain.    ACTIVITY LIMITATIONS  self-care,  home chores,    PARTICIPATION LIMITATIONS:  community, playing with granddtr on floor  (leakage occurs)    PERSONAL FACTORS  Hx of bladder CA, HOLEP, R inguinal hernia repair, L inguinal hernia, skin CA, DISH, Rheumatoid arthritis, COPD, and additional comorbidities are also affecting patient's functional outcome.    REHAB POTENTIAL: Good   CLINICAL DECISION MAKING: Evolving/moderate complexity   EVALUATION  COMPLEXITY: Moderate    PATIENT EDUCATION:    Education details: Showed pt anatomy images. Explained muscles attachments/ connection, physiology of deep core system/ spinal- thoracic-pelvis-lower kinetic chain as they relate to pt's presentation, Sx, and past Hx. Explained what and how these areas of deficits need to be restored to balance and function    See Therapeutic activity / neuromuscular re-education section  Answered pt's questions.   Person educated: Patient Education method: Explanation, Demonstration, Tactile cues, Verbal cues, and Handouts Education comprehension: verbalized understanding, returned demonstration, verbal cues required, tactile cues required, and needs further education     PLAN: PT FREQUENCY: 1x/week   PT DURATION: 10 weeks   PLANNED INTERVENTIONS: Therapeutic exercises, Therapeutic activity, Neuromuscular re-education, Balance training, Gait training, Patient/Family education, Self Care, Joint mobilization, Spinal mobilization, Moist heat, Taping, and Manual therapy.   PLAN FOR NEXT SESSION: See clinical impression for plan     GOALS: Goals reviewed with patient? Yes  SHORT TERM GOALS: Target date: 06/13/2022    Pt will demo IND with HEP                    Baseline: Not IND            Goal status: INITIAL   LONG TERM GOALS: Target date:  07/24/2022     1.Pt will demo proper deep core coordination without chest breathing and optimal excursion of diaphragm/pelvic floor in order to promote spinal stability and pelvic floor function  Baseline: dyscoordination Goal status: INITIAL  2.  Pt will demo > 5 pt change on FOTO  to improve QOL and function  Urinary Problem baseline: 54 pts  PFDI Urinary baseline:  29pts  ____  Lumbar baseline:  57 pts  Goal status: INITIAL  3.  Pt will demo proper body mechanics in against gravity tasks and ADLs  work tasks, fitness  to minimize straining pelvic floor / back                  Baseline: not  IND, improper form that places strain on pelvic floor .    Leaking  with stand <> floor                Goal status: INITIAL    4. Pt will increase gait speed > 1.2 m/s with improved mechanics to return to walking 2 mile  Baseline:  0.9 m/s , limited trunk mobility, L hip higher Goal status: INITIAL    5. Pt will report decreased use of Guard pads and wearing dribble pads when he goes out in community  Baseline: Guard pad when he goes out  Goal status: INITIAL   6. Pt will report decreased pain at mid back by < 50% when washing dishes and also decreased LBP < 50% when he drives for 4 hours Oct 30th               Baseline:   washing dishes : midback pain:  03/08/09                         Driving  4 hours: low back pain :  7/10   Goal status: INITIAL     Jerl Mina, PT 05/27/2022, 3:05 PM

## 2022-05-27 NOTE — Patient Instructions (Signed)
Seated :   Inhale, tall, L hand on R thigh, R hand by R hip   Exhale gentle twist at the inhale to the R , chest, then head turn to the R,  8-10 reps    Unwind with breath

## 2022-06-03 ENCOUNTER — Ambulatory Visit: Payer: Medicare Other | Attending: Urology | Admitting: Physical Therapy

## 2022-06-03 DIAGNOSIS — R278 Other lack of coordination: Secondary | ICD-10-CM | POA: Diagnosis present

## 2022-06-03 DIAGNOSIS — R2689 Other abnormalities of gait and mobility: Secondary | ICD-10-CM | POA: Diagnosis present

## 2022-06-03 DIAGNOSIS — M5459 Other low back pain: Secondary | ICD-10-CM | POA: Diagnosis present

## 2022-06-03 DIAGNOSIS — M533 Sacrococcygeal disorders, not elsewhere classified: Secondary | ICD-10-CM | POA: Diagnosis not present

## 2022-06-03 NOTE — Therapy (Signed)
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[place])    Cancer ([place])    bladder   Chronic obstructive pulmonary disease ([place]) 4/80/1655   Complication of anesthesia    Asthmatic episode when awakening from colonoscopy   DISH (diffuse idiopathic skeletal hyperostosis)    neck pain   GERD (gastroesophageal reflux disease)    Hyperlipidemia    Hyperlipidemia    Inguinal hernia    Left Side   Nasal congestion    nasal polyps   Osteopenia    Osteoporosis    RA (rheumatoid arthritis) (HCC)    shoulders and hands   Rheumatoid arthritis ([place])    Sciatica    Sciatica    Sciatica    Tinnitus aurium, left    Past Surgical History:  Procedure Laterality Date   BASAL CELL CARCINOMA EXCISION     COLONOSCOPY  06/06/2010   COLONOSCOPY WITH PROPOFOL N/A 06/08/2020   Procedure: COLONOSCOPY WITH PROPOFOL;  Surgeon: Lucilla Lame, MD;  Location: [place];  Service: Endoscopy;  Laterality: N/A;  priority 4   ETHMOIDECTOMY Bilateral 07/03/2020   Procedure: ETHMOIDECTOMY;  Surgeon: Clyde Canterbury, MD;  Location: [place];  Service: ENT;  Laterality: Bilateral;   FRONTAL SINUS EXPLORATION Bilateral 07/03/2020   Procedure: FRONTAL SINUS EXPLORATION;  Surgeon: Clyde Canterbury, MD;  Location: [place];  Service: ENT;  Laterality: Bilateral;   HERNIA REPAIR     HOLEP-LASER ENUCLEATION OF THE PROSTATE WITH MORCELLATION N/A 09/23/2021   Procedure:  HOLEP-LASER ENUCLEATION OF THE PROSTATE WITH MORCELLATION;  Surgeon: Hollice Espy, MD;  Location: [facility];  Service: Urology;  Laterality: N/A;   IMAGE GUIDED SINUS SURGERY N/A 07/03/2020   Procedure: IMAGE GUIDED SINUS SURGERY;  Surgeon: Clyde Canterbury, MD;  Location: [place];  Service: ENT;  Laterality: N/A;  need stryker disk PLACE DISK ON OR CHARGE NURSE DESK 10-01 KP Another disk on OR charge nurse desk 10.14.21 DS   MAXILLARY ANTROSTOMY Bilateral 07/03/2020   Procedure: MAXILLARY ANTROSTOMY with tissue removal;  Surgeon: Clyde Canterbury, MD;  Location: [place];  Service: ENT;  Laterality: Bilateral;   NASAL SEPTOPLASTY W/ TURBINOPLASTY Bilateral 07/03/2020   Procedure: NASAL SEPTOPLASTY;  Surgeon: Clyde Canterbury, MD;  Location: [place];  Service: ENT;  Laterality: Bilateral;   SPHENOIDECTOMY Bilateral 07/03/2020   Procedure: Coralee Pesa;  Surgeon: Clyde Canterbury, MD;  Location: [place];  Service: ENT;  Laterality: Bilateral;   TRANSURETHRAL RESECTION OF BLADDER TUMOR WITH MITOMYCIN-C N/A 06/21/2018   Procedure: TRANSURETHRAL RESECTION OF BLADDER TUMOR WITH Gemcitabine;  Surgeon: Hollice Espy, MD;  Location: [facility];  Service: Urology;  Laterality: N/A;   VARICOCELECTOMY     VARICOCELECTOMY     Patient Active Problem List   Diagnosis Date Noted   Former smoker 10/10/2020   Encounter  for screening colonoscopy    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[place]) 05/03/2020   Abdominal aortic atherosclerosis ([place]) 04/11/2020   Acquired deviated nasal septum 09/23/2019   BPH with obstruction/lower urinary tract symptoms 04/22/2018   DISH (diffuse idiopathic skeletal hyperostosis) 11/14/2016   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[place]) 02/06/2016   Unilateral inguinal hernia without obstruction or gangrene 05/22/2015   GERD (gastroesophageal reflux disease) 11/15/2014   Hyperlipidemia 11/15/2014   COPD mixed type ([place]) 11/15/2014   Osteoporosis  without current pathological fracture 11/15/2014   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    3) upper back pain between shoulder blades:  Pt had no pain with after carrying 26 lb granddtr in his arm and walking with her for 24 min.   Pt has DISH (diffuse idiopathic skeletal hyperostosis)   PERTINENT HISTORY:  Hx of bladder CA, HOLEP, R inguinal hernia repair, L inguinal hernia, skin CA, Rheumatoid arthritis, COPD, and additional comorbidities: DISH (diffuse idiopathic skeletal hyperostosis)  PAIN:  Are you having pain?    PRECAUTIONS: None  WEIGHT BEARING RESTRICTIONS No  FALLS:  Has patient fallen in last 6 months? No  LIVING ENVIRONMENT: Lives with: lives alone Lives in: House/apartment Stairs: yes  Has following equipment at home: no   OCCUPATION:   retired [employer]  PLOF: Independent  PATIENT GOALS   Wear pads instead of Guard pads    OBJECTIVE:    [facility] PT Assessment - 06/03/22 1504       Observation/Other Assessments   Observations more upright posture, forward head still present      AROM   Overall AROM Comments cervical rotation in semi reclined position ( plinth head set at 150deg, pillow lwengthwise to spine, neck roll under neck)  30 deg B cervical rotation   Post Tx: pt in flat position, 35 deg R rotation, L 40 deg      Palpation   Spinal mobility tightness along intercostals , paraspinals,  rhomboids/  midtrap/low trap B  [place] Adult PT Treatment/Exercise - 06/03/22 1504                Neuro Re-ed    Neuro Re-ed Details  cued for log rolling technique, cervical/ scapular retraction      Modalities   Modalities Moist Heat      Moist Heat Therapy   Number Minutes Moist Heat 5 Minutes    Moist Heat Location --   thoracic/ cervical ( lower trunk rotation to L, supported    ( unbilled)     Manual Therapy   Manual therapy comments STM/MWM at problem areas noted in assessment to promote segmental rotation of spine and address forward head posture, optimize diaphragmatic excursion              HOME EXERCISE PROGRAM: See pt instruction section    ASSESSMENT:  CLINICAL IMPRESSION:   Pt dem'd good carry over with more lumbar segmental mobility and is showing less thoracic kyphosis. Continued to  addressed pt's forward head posture and  limited  upper trunk segmental mobility.  Modified HEP due DISH and avoided sidelying position due to increased pain at arms post Tx from sidelying position.   Today, following Tx, pt demo'd improved diaphragm excursion and less forward head posture with increased cervical ROM rotation.  Continue to address tightness/ hypomobility at upper thoracic back mm/ cervical mm and progress to deep core strengthening and scapular retraction strengthening at next session.   Pt benefits from skilled PT.    OBJECTIVE IMPAIRMENTS decreased activity tolerance, decreased coordination, decreased endurance, decreased mobility, difficulty walking, decreased ROM, decreased strength, decreased safety awareness, hypomobility, increased muscle spasms, impaired flexibility, improper body mechanics, postural dysfunction, and pain.    ACTIVITY LIMITATIONS  self-care,  home chores,    PARTICIPATION LIMITATIONS:  community, playing with granddtr on floor  (leakage occurs)    PERSONAL FACTORS  Hx of bladder CA, HOLEP, R inguinal hernia  repair, L inguinal hernia, skin CA, DISH, Rheumatoid arthritis, COPD, and additional comorbidities are also affecting patient's functional outcome.    REHAB POTENTIAL: Good   CLINICAL DECISION MAKING: Evolving/moderate complexity   EVALUATION COMPLEXITY: Moderate    PATIENT EDUCATION:    Education details: Showed pt anatomy images. Explained muscles attachments/ connection, physiology of deep core system/ spinal- thoracic-pelvis-lower kinetic chain as they relate to pt's presentation, Sx, and past Hx. Explained what and how these areas of deficits need to be restored to balance and function    See Therapeutic activity / neuromuscular re-education section  Answered pt's questions.   Person educated: Patient Education method: Explanation, Demonstration, Tactile cues, Verbal cues, and Handouts Education comprehension: verbalized understanding, returned demonstration, verbal cues required, tactile cues required, and needs further education     PLAN: PT FREQUENCY: 1x/week   PT DURATION: 10 weeks   PLANNED INTERVENTIONS: Therapeutic exercises, Therapeutic activity, Neuromuscular re-education, Balance training, Gait training, Patient/Family education, Self Care, Joint mobilization, Spinal mobilization, Moist heat, Taping, and Manual therapy.   PLAN FOR NEXT SESSION: See clinical impression for plan     GOALS: Goals reviewed with patient? Yes  SHORT TERM GOALS: Target date: 06/13/2022    Pt will demo IND with HEP                    Baseline: Not IND            Goal status: INITIAL   LONG TERM GOALS: Target date:  07/24/2022     1.Pt will demo proper deep core  coordination without chest breathing and optimal excursion of diaphragm/pelvic floor in order to promote spinal stability and pelvic floor function  Baseline: dyscoordination Goal status: INITIAL  2.  Pt will demo > 5 pt change on FOTO  to improve QOL and function  Urinary Problem baseline: 54 pts  PFDI Urinary  baseline:  29pts  ____  Lumbar baseline:  57 pts  Goal status: INITIAL  3.  Pt will demo proper body mechanics in against gravity tasks and ADLs  work tasks, fitness  to minimize straining pelvic floor / back                  Baseline: not IND, improper form that places strain on pelvic floor .    Leaking  with stand <> floor                Goal status: INITIAL    4. Pt will increase gait speed > 1.2 m/s with improved mechanics to return to walking 2 mile  Baseline:  0.9 m/s , limited trunk mobility, L hip higher Goal status: INITIAL    5. Pt will report decreased use of Guard pads and wearing dribble pads when he goes out in community  Baseline: Guard pad when he goes out  Goal status: INITIAL   6. Pt will report decreased pain at mid back by < 50% when washing dishes and also decreased LBP < 50% when he drives for 4 hours Oct 30th               Baseline:   washing dishes : midback pain:  03/08/09                         Driving  4 hours: low back pain :  7/10   Goal status: INITIAL     Jerl Mina, PT 06/03/2022, 3:28 PM

## 2022-06-10 ENCOUNTER — Ambulatory Visit: Payer: Medicare Other | Admitting: Physical Therapy

## 2022-06-10 DIAGNOSIS — M5459 Other low back pain: Secondary | ICD-10-CM

## 2022-06-10 DIAGNOSIS — R278 Other lack of coordination: Secondary | ICD-10-CM

## 2022-06-10 DIAGNOSIS — M533 Sacrococcygeal disorders, not elsewhere classified: Secondary | ICD-10-CM

## 2022-06-10 DIAGNOSIS — R2689 Other abnormalities of gait and mobility: Secondary | ICD-10-CM

## 2022-06-10 NOTE — Patient Instructions (Addendum)
"  W" exercise  10 reps x 2 sets   Band is placed under feet, knees bent, feet are hip width apart Hold band with thumbs point out, keep upper arm and elbow touching the bed the whole time  - inhale and then exhale pull bands by bending elbows hands move in a "w"  (feel shoulder blades squeezing)   ________________   Oblique/ scapula stabilization   Opposite arm   Place band in "U"    band under ballmounds  while laying on back w/ knees bent     20 reps  on each side  Holding band from opposite thigh,  Inhale,    exhale then pull band across body while keeping elbow , shoulders, back of the head pressed down    ______________      "Pull apart"  Green band , elbows by side, shoulders down and back, chin tuck  Exhale to pull apart 20 reps  _____________________________

## 2022-06-11 NOTE — Therapy (Signed)
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[place])    Cancer ([place])    bladder   Chronic obstructive pulmonary disease ([place]) 01/09/2584   Complication of anesthesia    Asthmatic episode when awakening from colonoscopy   DISH (diffuse idiopathic skeletal hyperostosis)    neck pain   GERD (gastroesophageal reflux disease)    Hyperlipidemia    Hyperlipidemia    Inguinal hernia    Left Side   Nasal congestion    nasal polyps   Osteopenia    Osteoporosis    RA (rheumatoid arthritis) (HCC)    shoulders and hands   Rheumatoid arthritis ([place])    Sciatica    Sciatica    Sciatica    Tinnitus aurium, left    Past Surgical History:  Procedure Laterality Date   BASAL CELL CARCINOMA EXCISION     COLONOSCOPY  06/06/2010   COLONOSCOPY WITH PROPOFOL N/A 06/08/2020   Procedure: COLONOSCOPY WITH PROPOFOL;  Surgeon: Lucilla Lame, MD;  Location: [place];  Service: Endoscopy;  Laterality: N/A;  priority 4   ETHMOIDECTOMY Bilateral 07/03/2020   Procedure: ETHMOIDECTOMY;  Surgeon: Clyde Canterbury, MD;  Location: [place];  Service: ENT;  Laterality: Bilateral;   FRONTAL SINUS EXPLORATION Bilateral 07/03/2020   Procedure: FRONTAL SINUS EXPLORATION;  Surgeon: Clyde Canterbury, MD;  Location: [place];  Service: ENT;  Laterality: Bilateral;   HERNIA REPAIR     HOLEP-LASER ENUCLEATION OF THE PROSTATE WITH MORCELLATION N/A 09/23/2021   Procedure: HOLEP-LASER ENUCLEATION OF THE PROSTATE WITH MORCELLATION;  Surgeon: Hollice Espy, MD;  Location: [facility];  Service:  Urology;  Laterality: N/A;   IMAGE GUIDED SINUS SURGERY N/A 07/03/2020   Procedure: IMAGE GUIDED SINUS SURGERY;  Surgeon: Clyde Canterbury, MD;  Location: [place];  Service: ENT;  Laterality: N/A;  need stryker disk PLACE DISK ON OR CHARGE NURSE DESK 10-01 KP Another disk on OR charge nurse desk 10.14.21 DS   MAXILLARY ANTROSTOMY Bilateral 07/03/2020   Procedure: MAXILLARY ANTROSTOMY with tissue removal;  Surgeon: Clyde Canterbury, MD;  Location: [place];  Service: ENT;  Laterality: Bilateral;   NASAL SEPTOPLASTY W/ TURBINOPLASTY Bilateral 07/03/2020   Procedure: NASAL SEPTOPLASTY;  Surgeon: Clyde Canterbury, MD;  Location: [place];  Service: ENT;  Laterality: Bilateral;   SPHENOIDECTOMY Bilateral 07/03/2020   Procedure: Coralee Pesa;  Surgeon: Clyde Canterbury, MD;  Location: [place];  Service: ENT;  Laterality: Bilateral;   TRANSURETHRAL RESECTION OF BLADDER TUMOR WITH MITOMYCIN-C N/A 06/21/2018   Procedure: TRANSURETHRAL RESECTION OF BLADDER TUMOR WITH Gemcitabine;  Surgeon: Hollice Espy, MD;  Location: [facility];  Service: Urology;  Laterality: N/A;   VARICOCELECTOMY     VARICOCELECTOMY     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[place]) 05/03/2020   Abdominal aortic  atherosclerosis ([place]) 04/11/2020   Acquired deviated nasal septum 09/23/2019   BPH with obstruction/lower urinary tract symptoms 04/22/2018   DISH (diffuse idiopathic skeletal hyperostosis) 11/14/2016   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[place]) 02/06/2016   Unilateral inguinal hernia without obstruction or gangrene 05/22/2015   GERD (gastroesophageal reflux disease) 11/15/2014   Hyperlipidemia 11/15/2014   COPD mixed type ([place]) 11/15/2014   Osteoporosis without current pathological fracture 11/15/2014   Rheumatoid arthritis involving both shoulders with positive  rheumatoid factor ([place]) 11/15/2014    PCP: Lloyd Huger PROVIDER: Erlene Quan   REFERRING DIAG: Stress incontinence  Rationale for Evaluation and Treatment Rehabilitation  THERAPY DIAG:   ONSET DATE: SUI started after HOLEP Jan 2023   SUBJECTIVE:                                                                                                                                                                                           SUBJECTIVE STATEMENT: 1) SUI :    2) CLBP:   Pt has had 3 weeks in a row without LBP.    3) upper back pain between shoulder blades:    Pt has DISH (diffuse idiopathic skeletal hyperostosis)   PERTINENT HISTORY:  Hx of bladder CA, HOLEP, R inguinal hernia repair, L inguinal hernia, skin CA, Rheumatoid arthritis, COPD, and additional comorbidities: DISH (diffuse idiopathic skeletal hyperostosis)  PAIN:  Are you having pain?    PRECAUTIONS: None  WEIGHT BEARING RESTRICTIONS No  FALLS:  Has patient fallen in last 6 months? No  LIVING ENVIRONMENT: Lives with: lives alone Lives in: House/apartment Stairs: yes  Has following equipment at home: no   OCCUPATION:   retired [employer]  PLOF: Independent  PATIENT GOALS   Wear pads instead of Guard pads    OBJECTIVE:    [facility] PT Assessment - 06/11/22 1031       Palpation   Palpation comment tightness at intercostals T5-10 B, R interspinals/ paraspinal , medial scapula mm atttachments             [facility] Adult PT Treatment/Exercise - 06/11/22 1031       Neuro Re-ed    Neuro Re-ed Details  cued for resistance band green in hooklying scapular cervical strengthening      Exercises   Exercises --   [place] with green band with lats  20 reps     Manual Therapy   Manual therapy comments STM/MWM at problem areas noted in assessment to promote segmental rotation of spine and address forwward head posture, optimize diaphragmatic excursion                HOME  EXERCISE PROGRAM: See pt instruction section    ASSESSMENT:  CLINICAL IMPRESSION:  Continued to address pt's forward head posture with manual Tx to decrease intercostals/ paraspinal/ interspinals, and medial scapula mm attachments today.    Today, following Tx, pt demo'd improved diaphragm excursion and progressed to resistance band strengthening for cervical / scapular retraction.  Continue to progress to deep core strengthening at next session.   Pt benefits from skilled PT.    OBJECTIVE IMPAIRMENTS decreased activity tolerance, decreased coordination, decreased endurance, decreased mobility, difficulty walking, decreased ROM, decreased strength, decreased safety awareness, hypomobility, increased muscle spasms, impaired flexibility, improper body mechanics, postural dysfunction, and pain.    ACTIVITY LIMITATIONS  self-care,  home chores,    PARTICIPATION LIMITATIONS:  community, playing with granddtr on floor  (leakage occurs)    PERSONAL FACTORS  Hx of bladder CA, HOLEP, R inguinal hernia repair, L inguinal hernia, skin CA, DISH, Rheumatoid arthritis, COPD, and additional comorbidities are also affecting patient's functional outcome.    REHAB POTENTIAL: Good   CLINICAL DECISION MAKING: Evolving/moderate complexity   EVALUATION COMPLEXITY: Moderate    PATIENT EDUCATION:    Education details: Showed pt anatomy images. Explained muscles attachments/ connection, physiology of deep core system/ spinal- thoracic-pelvis-lower kinetic chain as they relate to pt's presentation, Sx, and past Hx. Explained what and how these areas of deficits need to be restored to balance and function    See Therapeutic activity / neuromuscular re-education section  Answered pt's questions.   Person educated: Patient Education method: Explanation, Demonstration, Tactile cues, Verbal cues, and Handouts Education comprehension: verbalized understanding, returned demonstration, verbal cues required,  tactile cues required, and needs further education     PLAN: PT FREQUENCY: 1x/week   PT DURATION: 10 weeks   PLANNED INTERVENTIONS: Therapeutic exercises, Therapeutic activity, Neuromuscular re-education, Balance training, Gait training, Patient/Family education, Self Care, Joint mobilization, Spinal mobilization, Moist heat, Taping, and Manual therapy.   PLAN FOR NEXT SESSION: See clinical impression for plan     GOALS: Goals reviewed with patient? Yes  SHORT TERM GOALS: Target date: 06/13/2022    Pt will demo IND with HEP                    Baseline: Not IND            Goal status: INITIAL   LONG TERM GOALS: Target date:  07/24/2022     1.Pt will demo proper deep core coordination without chest breathing and optimal excursion of diaphragm/pelvic floor in order to promote spinal stability and pelvic floor function  Baseline: dyscoordination Goal status: INITIAL  2.  Pt will demo > 5 pt change on FOTO  to improve QOL and function  Urinary Problem baseline: 54 pts  PFDI Urinary baseline:  29pts  ____  Lumbar baseline:  57 pts  Goal status: INITIAL  3.  Pt will demo proper body mechanics in against gravity tasks and ADLs  work tasks, fitness  to minimize straining pelvic floor / back                  Baseline: not IND, improper form that places strain on pelvic floor .    Leaking  with stand <> floor                Goal status: INITIAL    4. Pt will increase gait speed > 1.2 m/s with improved mechanics to return to walking 2 mile  Baseline:  0.9 m/s , limited trunk  mobility, L hip higher Goal status: INITIAL    5. Pt will report decreased use of Guard pads and wearing dribble pads when he goes out in community  Baseline: Guard pad when he goes out  Goal status: INITIAL   6. Pt will report decreased pain at mid back by < 50% when washing dishes and also decreased LBP < 50% when he drives for 4 hours Oct 30th               Baseline:   washing dishes :  midback pain:  03/08/09                         Driving  4 hours: low back pain :  7/10   Goal status: INITIAL     Jerl Mina, PT 06/11/2022, 10:33 AM

## 2022-06-19 ENCOUNTER — Ambulatory Visit: Payer: Medicare Other | Admitting: Physical Therapy

## 2022-06-19 ENCOUNTER — Telehealth: Payer: Self-pay | Admitting: Internal Medicine

## 2022-06-19 DIAGNOSIS — M5459 Other low back pain: Secondary | ICD-10-CM

## 2022-06-19 DIAGNOSIS — M533 Sacrococcygeal disorders, not elsewhere classified: Secondary | ICD-10-CM

## 2022-06-19 DIAGNOSIS — R2689 Other abnormalities of gait and mobility: Secondary | ICD-10-CM

## 2022-06-19 DIAGNOSIS — R278 Other lack of coordination: Secondary | ICD-10-CM

## 2022-06-19 NOTE — Telephone Encounter (Signed)
Copied from [place] [id number]. Topic: Appointment Scheduling - Scheduling Inquiry for Clinic >> Jun 19, 2022  2:24 PM Ludger Nutting wrote: Patient called to inquire why his AWV was rescheduled to 12/10/22. Patient states that he received a letter from Medicare saying that he needed to have his first AWV within the first year of his coverage. Please advise patient.

## 2022-06-19 NOTE — Therapy (Signed)
OUTPATIENT PHYSICAL THERAPY Treatment    Patient Name: Nathan Anthony MRN: [id number] DOB:10-15-1955, 66 y.o., male Today's Date: 06/19/2022   PT End of Session - 06/19/22 1108     Visit Number 6    Number of Visits 10    Date for PT Re-Evaluation 07/24/22    PT Start Time 8242    PT Stop Time 1145    PT Time Calculation (min) 40 min    Activity Tolerance Patient tolerated treatment well;No increased pain    Behavior During Therapy WFL for tasks assessed/performed             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HCC)    Cancer (HCC)    bladder   Chronic obstructive pulmonary disease ([place]) 3/53/6144   Complication of anesthesia    Asthmatic episode when awakening from colonoscopy   DISH (diffuse idiopathic skeletal hyperostosis)    neck pain   GERD (gastroesophageal reflux disease)    Hyperlipidemia    Hyperlipidemia    Inguinal hernia    Left Side   Nasal congestion    nasal polyps   Osteopenia    Osteoporosis    RA (rheumatoid arthritis) (HCC)    shoulders and hands   Rheumatoid arthritis (HCC)    Sciatica    Sciatica    Sciatica    Tinnitus aurium, left    Past Surgical History:  Procedure Laterality Date   BASAL CELL CARCINOMA EXCISION     COLONOSCOPY  06/06/2010   COLONOSCOPY WITH PROPOFOL N/A 06/08/2020   Procedure: COLONOSCOPY WITH PROPOFOL;  Surgeon: Lucilla Lame, MD;  Location: [place];  Service: Endoscopy;  Laterality: N/A;  priority 4   ETHMOIDECTOMY Bilateral 07/03/2020   Procedure: ETHMOIDECTOMY;  Surgeon: Clyde Canterbury, MD;  Location: [place];  Service: ENT;  Laterality: Bilateral;   FRONTAL SINUS EXPLORATION Bilateral 07/03/2020   Procedure: FRONTAL SINUS EXPLORATION;  Surgeon: Clyde Canterbury, MD;  Location: [place];  Service: ENT;  Laterality: Bilateral;   HERNIA REPAIR     HOLEP-LASER ENUCLEATION OF THE PROSTATE WITH MORCELLATION N/A 09/23/2021    Procedure: HOLEP-LASER ENUCLEATION OF THE PROSTATE WITH MORCELLATION;  Surgeon: Hollice Espy, MD;  Location: [facility];  Service: Urology;  Laterality: N/A;   IMAGE GUIDED SINUS SURGERY N/A 07/03/2020   Procedure: IMAGE GUIDED SINUS SURGERY;  Surgeon: Clyde Canterbury, MD;  Location: [place];  Service: ENT;  Laterality: N/A;  need stryker disk PLACE DISK ON OR CHARGE NURSE DESK 10-01 KP Another disk on OR charge nurse desk 10.14.21 DS   MAXILLARY ANTROSTOMY Bilateral 07/03/2020   Procedure: MAXILLARY ANTROSTOMY with tissue removal;  Surgeon: Clyde Canterbury, MD;  Location: [place];  Service: ENT;  Laterality: Bilateral;   NASAL SEPTOPLASTY W/ TURBINOPLASTY Bilateral 07/03/2020   Procedure: NASAL SEPTOPLASTY;  Surgeon: Clyde Canterbury, MD;  Location: [place];  Service: ENT;  Laterality: Bilateral;   SPHENOIDECTOMY Bilateral 07/03/2020   Procedure: Coralee Pesa;  Surgeon: Clyde Canterbury, MD;  Location: [place];  Service: ENT;  Laterality: Bilateral;   TRANSURETHRAL RESECTION OF BLADDER TUMOR WITH MITOMYCIN-C N/A 06/21/2018   Procedure: TRANSURETHRAL RESECTION OF BLADDER TUMOR WITH Gemcitabine;  Surgeon: Hollice Espy, MD;  Location: [facility];  Service: Urology;  Laterality: N/A;   VARICOCELECTOMY     VARICOCELECTOMY     Patient Active Problem List   Diagnosis Date Noted   Former smoker 10/10/2020  Encounter for screening colonoscopy    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   3) upper back pain between shoulder blades:  it's been ok . Pt had no pain when using arms ( wiring outlet boxes) and doing dishes whereas he used to get pain with these activities.   Pt has DISH (diffuse idiopathic skeletal hyperostosis)   PERTINENT HISTORY:  Hx of bladder CA, HOLEP, R inguinal hernia repair, L inguinal hernia, skin CA, Rheumatoid arthritis, COPD, and additional comorbidities: DISH (diffuse idiopathic skeletal hyperostosis)  PAIN:  Are you having pain?    PRECAUTIONS: None  WEIGHT BEARING RESTRICTIONS No  FALLS:  Has patient fallen in last 6 months? No  LIVING ENVIRONMENT: Lives with: lives alone Lives in: House/apartment Stairs: yes  Has following equipment at home: no   OCCUPATION:   retired [employer]  PLOF: Independent  PATIENT GOALS   Wear pads instead of Guard pads    OBJECTIVE:     [facility] PT Assessment - 06/19/22 1114       AROM   Overall AROM Comments cervical rotation in supine, 30 deg B cervical rotation ( post Tx: 40 deg B)      Palpation   Palpation comment R paraspinals, SP of lumbar. thoracic/ cervical, intercostals, medial scapula R tightness             [facility] Adult PT Treatment/Exercise - 06/19/22 1152       Modalities   Modalities Moist Heat      Moist Heat  Therapy  Number Minutes Moist Heat 5 Minutes    Moist Heat Location --   5 ( unbilled) thoracic/ cervical, lower trunk rotation to L     Manual Therapy   Manual therapy comments STM/MWM at problem areas noted in assessment to promote segmental rotation of spine and address forwward head posture, optimize diaphragmatic excursion, neck rotation ROM                   HOME EXERCISE PROGRAM: See pt instruction section    ASSESSMENT:  CLINICAL IMPRESSION:  Pt had no midback pain when using arms ( wiring outlet boxes) and doing dishes whereas he used to get pain with these activities.    Today, further applied manual Tx to promote segmental movement with spine from lumbar to cervical. Pt achieved increased cervical rotation from 30 deg to 40 deg post Tx. This will continue to promote upright posture and make IAP more efficient .   Next session: plan to progress to deep core strengthening and pelvic floor coordination . Educated to stop doing standing breathing exercise he learned from an Conservation officer, nature.    Pt benefits from skilled PT.    OBJECTIVE IMPAIRMENTS decreased activity tolerance, decreased coordination, decreased endurance, decreased mobility, difficulty walking, decreased ROM, decreased strength, decreased safety awareness, hypomobility, increased muscle spasms, impaired flexibility, improper body mechanics, postural dysfunction, and pain.    ACTIVITY LIMITATIONS  self-care,  home chores,    PARTICIPATION LIMITATIONS:  community, playing with granddtr on floor  (leakage occurs)    PERSONAL FACTORS  Hx of bladder CA, HOLEP, R inguinal hernia repair, L inguinal hernia, skin CA, DISH, Rheumatoid arthritis, COPD, and additional comorbidities are also affecting patient's functional outcome.    REHAB POTENTIAL: Good   CLINICAL DECISION MAKING: Evolving/moderate complexity   EVALUATION COMPLEXITY: Moderate    PATIENT EDUCATION:    Education details: Showed  pt anatomy images. Explained muscles attachments/ connection, physiology of deep core system/ spinal- thoracic-pelvis-lower kinetic chain as they relate to pt's presentation, Sx, and past Hx. Explained what and how these areas of deficits need to be restored to balance and function    See Therapeutic activity / neuromuscular re-education section  Answered pt's questions.   Person educated: Patient Education method: Explanation, Demonstration, Tactile cues, Verbal cues, and Handouts Education comprehension: verbalized understanding, returned demonstration, verbal cues required, tactile cues required, and needs further education     PLAN: PT FREQUENCY: 1x/week   PT DURATION: 10 weeks   PLANNED INTERVENTIONS: Therapeutic exercises, Therapeutic activity, Neuromuscular re-education, Balance training, Gait training, Patient/Family education, Self Care, Joint mobilization, Spinal mobilization, Moist heat, Taping, and Manual therapy.   PLAN FOR NEXT SESSION: See clinical impression for plan     GOALS: Goals reviewed with patient? Yes  SHORT TERM GOALS: Target date: 06/13/2022    Pt will demo IND with HEP                    Baseline: Not IND            Goal status: INITIAL   LONG TERM GOALS: Target date:  07/24/2022     1.Pt will demo proper deep core coordination without chest breathing and optimal excursion of diaphragm/pelvic floor in order to promote spinal stability and pelvic floor function  Baseline: dyscoordination Goal status: INITIAL  2.  Pt will demo > 5 pt change on FOTO  to improve QOL and function  Urinary Problem baseline: 54 pts  PFDI Urinary baseline:  29pts  ____  Lumbar baseline:  57 pts  Goal status: INITIAL  3.  Pt will demo proper body mechanics in against gravity tasks and ADLs  work tasks, fitness  to minimize straining pelvic floor / back                  Baseline: not IND, improper form that places strain on pelvic floor .    Leaking  with  stand <> floor                Goal status: INITIAL    4. Pt will increase gait speed > 1.2 m/s with improved mechanics to return to walking 2 mile  Baseline:  0.9 m/s , limited trunk mobility, L hip higher Goal status: INITIAL    5. Pt will report decreased use of Guard pads and wearing dribble pads when he goes out in community  Baseline: Guard pad when he goes out  Goal status: INITIAL   6. Pt will report decreased pain at mid back by < 50% when washing dishes and also decreased LBP < 50% when he drives for 4 hours Oct 30th               Baseline:   washing dishes : midback pain:  03/08/09                         Driving  4 hours: low back pain :  7/10   Goal status: INITIAL     Jerl Mina, PT 06/19/2022, 11:09 AM

## 2022-06-20 NOTE — Telephone Encounter (Signed)
Spoke with patient and informed Dr [name] does not typically do welcome to medicare visits. She just has patient scheduled for their initial visit with the wellness nurse one year after medicare service started.  He verbalized understanding.  - Taquita Demby

## 2022-06-20 NOTE — Telephone Encounter (Signed)
Please review.  KP

## 2022-06-24 ENCOUNTER — Ambulatory Visit: Payer: Medicare Other | Admitting: Physical Therapy

## 2022-06-24 DIAGNOSIS — M533 Sacrococcygeal disorders, not elsewhere classified: Secondary | ICD-10-CM | POA: Diagnosis not present

## 2022-06-24 DIAGNOSIS — M5459 Other low back pain: Secondary | ICD-10-CM

## 2022-06-24 DIAGNOSIS — R278 Other lack of coordination: Secondary | ICD-10-CM

## 2022-06-24 DIAGNOSIS — R2689 Other abnormalities of gait and mobility: Secondary | ICD-10-CM

## 2022-06-24 NOTE — Therapy (Signed)
OUTPATIENT PHYSICAL THERAPY Treatment    Patient Name: Nathan Anthony MRN: [id number] DOB:[birth year], 66 y.o., male Today's Date: 06/24/2022   PT End of Session - 06/24/22 1426     Visit Number 7    Number of Visits 10    Date for PT Re-Evaluation 07/24/22    PT Start Time 1419    PT Stop Time 1500    PT Time Calculation (min) 41 min    Activity Tolerance Patient tolerated treatment well;No increased pain    Behavior During Therapy WFL for tasks assessed/performed             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HCC)    Cancer (HCC)    bladder   Chronic obstructive pulmonary disease ([place]) 12/08/8117   Complication of anesthesia    Asthmatic episode when awakening from colonoscopy   DISH (diffuse idiopathic skeletal hyperostosis)    neck pain   GERD (gastroesophageal reflux disease)    Hyperlipidemia    Hyperlipidemia    Inguinal hernia    Left Side   Nasal congestion    nasal polyps   Osteopenia    Osteoporosis    RA (rheumatoid arthritis) (HCC)    shoulders and hands   Rheumatoid arthritis (HCC)    Sciatica    Sciatica    Sciatica    Tinnitus aurium, left    Past Surgical History:  Procedure Laterality Date   BASAL CELL CARCINOMA EXCISION     COLONOSCOPY  06/06/2010   COLONOSCOPY WITH PROPOFOL N/A 06/08/2020   Procedure: COLONOSCOPY WITH PROPOFOL;  Surgeon: Lucilla Lame, MD;  Location: [place];  Service: Endoscopy;  Laterality: N/A;  priority 4   ETHMOIDECTOMY Bilateral 07/03/2020   Procedure: ETHMOIDECTOMY;  Surgeon: Clyde Canterbury, MD;  Location: [place];  Service: ENT;  Laterality: Bilateral;   FRONTAL SINUS EXPLORATION Bilateral 07/03/2020   Procedure: FRONTAL SINUS EXPLORATION;  Surgeon: Clyde Canterbury, MD;  Location: [place];  Service: ENT;  Laterality: Bilateral;   HERNIA REPAIR     HOLEP-LASER ENUCLEATION OF THE PROSTATE WITH MORCELLATION N/A 09/23/2021    Procedure: HOLEP-LASER ENUCLEATION OF THE PROSTATE WITH MORCELLATION;  Surgeon: Hollice Espy, MD;  Location: [facility];  Service: Urology;  Laterality: N/A;   IMAGE GUIDED SINUS SURGERY N/A 07/03/2020   Procedure: IMAGE GUIDED SINUS SURGERY;  Surgeon: Clyde Canterbury, MD;  Location: [place];  Service: ENT;  Laterality: N/A;  need stryker disk PLACE DISK ON OR CHARGE NURSE DESK 10-01 KP Another disk on OR charge nurse desk 10.14.21 DS   MAXILLARY ANTROSTOMY Bilateral 07/03/2020   Procedure: MAXILLARY ANTROSTOMY with tissue removal;  Surgeon: Clyde Canterbury, MD;  Location: [place];  Service: ENT;  Laterality: Bilateral;   NASAL SEPTOPLASTY W/ TURBINOPLASTY Bilateral 07/03/2020   Procedure: NASAL SEPTOPLASTY;  Surgeon: Clyde Canterbury, MD;  Location: [place];  Service: ENT;  Laterality: Bilateral;   SPHENOIDECTOMY Bilateral 07/03/2020   Procedure: Coralee Pesa;  Surgeon: Clyde Canterbury, MD;  Location: [place];  Service: ENT;  Laterality: Bilateral;   TRANSURETHRAL RESECTION OF BLADDER TUMOR WITH MITOMYCIN-C N/A 06/21/2018   Procedure: TRANSURETHRAL RESECTION OF BLADDER TUMOR WITH Gemcitabine;  Surgeon: Hollice Espy, MD;  Location: [facility];  Service: Urology;  Laterality: N/A;   VARICOCELECTOMY     VARICOCELECTOMY     Patient Active Problem List   Diagnosis Date Noted   Former smoker 10/10/2020  Encounter for screening colonoscopy    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[place]) 05/03/2020   Abdominal aortic atherosclerosis ([place]) 04/11/2020   Acquired deviated nasal septum 09/23/2019   BPH with obstruction/lower urinary tract symptoms 04/22/2018   DISH (diffuse idiopathic skeletal hyperostosis) 11/14/2016   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[place]) 02/06/2016   Unilateral inguinal hernia without obstruction or gangrene 05/22/2015   GERD (gastroesophageal reflux disease) 11/15/2014   Hyperlipidemia 11/15/2014   COPD mixed type ([place]) 11/15/2014    Osteoporosis without current pathological fracture 11/15/2014   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 Also less leakage with yard work.   2) CLBP:   Pt has had 4 weeks in a row without LBP.   3) upper back pain between shoulder blades:  Pt has not haf any midpain since the beginning of PT. Pt used his leaf blower/ weed wacking  for 45 min and then he noticed L shoulder pain.  Pt's RA  is really bad in his shoulders and arms.   Pt has DISH (diffuse idiopathic skeletal hyperostosis)   PERTINENT HISTORY:  Hx of bladder CA, HOLEP, R inguinal hernia repair, L inguinal hernia, skin CA, Rheumatoid arthritis, COPD, and additional comorbidities: DISH (diffuse idiopathic skeletal hyperostosis)  PAIN:  Are you having pain?    PRECAUTIONS: None  WEIGHT BEARING RESTRICTIONS No  FALLS:  Has patient fallen in last 6 months? No  LIVING ENVIRONMENT: Lives with: lives alone Lives in: House/apartment Stairs: yes  Has following equipment at home: no   OCCUPATION:   retired [employer]  PLOF: Independent  PATIENT GOALS   Wear pads instead of Guard pads    OBJECTIVE:     [facility] PT Assessment - 06/24/22 1431       Coordination   Coordination and Movement Description pelvic pertubration with deep core level 2      AROM   Overall AROM Comments Cervical rotation in supine on one  pillow:  30 deg L , 45 deg  R      Palpation   Spinal mobility Tightness / tenderness along paraspinal/ interspinal, intercostal T10-12 posterior L             [facility] Adult PT Treatment/Exercise - 06/24/22 1501       Therapeutic Activites    Other Therapeutic Activities body mechanics with yard activities      Neuro Re-ed    Neuro Re-ed Details  cued      Modalities   Modalities Moist Heat      Moist Heat Therapy   Number Minutes Moist Heat 15 Minutes    Moist Heat Location --   L thoracic during instruction of deep core level 2     Manual Therapy   Manual therapy comments STM/MWM at problem areas noted in assessment to promote segmental rotation of spine and  optimize diaphragmatic excursion, neck rotation ROM                     HOME EXERCISE PROGRAM: See pt instruction section    ASSESSMENT:  CLINICAL IMPRESSION:  Pt has no leakage  for the first time with getting down and up from the floor to play with his granddtr. Pt had 50% less leakage with yard work activities.    Progress ed to deep core strengthening and pelvic floor coordination .        Instructed body mechanics for weed wacking, leaf blowing, and mopping to minimize straining pelvic floor, back, and shoulders.   Manual Tx was further applied to minimize tightness and L thoracic limitations. Anticipate more upright posture will help optimize IAP system for leakage and spinal mobility. Plan to further decrease thoracic tightness and review deep core coordination.   Pt benefits from skilled PT.    OBJECTIVE IMPAIRMENTS decreased activity tolerance, decreased coordination, decreased endurance, decreased mobility, difficulty walking, decreased ROM, decreased strength, decreased safety awareness, hypomobility, increased muscle spasms, impaired flexibility, improper body mechanics, postural dysfunction, and pain.    ACTIVITY LIMITATIONS  self-care,  home chores,    PARTICIPATION LIMITATIONS:   community, playing with granddtr on floor  (leakage occurs)    PERSONAL FACTORS  Hx of bladder CA, HOLEP, R inguinal hernia repair, L inguinal hernia, skin CA, DISH, Rheumatoid arthritis, COPD, and additional comorbidities are also affecting patient's functional outcome.    REHAB POTENTIAL: Good   CLINICAL DECISION MAKING: Evolving/moderate complexity   EVALUATION COMPLEXITY: Moderate    PATIENT EDUCATION:    Education details: Showed pt anatomy images. Explained muscles attachments/ connection, physiology of deep core system/ spinal- thoracic-pelvis-lower kinetic chain as they relate to pt's presentation, Sx, and past Hx. Explained what and how these areas of deficits need to be restored to balance and function    See Therapeutic activity / neuromuscular re-education section  Answered pt's questions.   Person educated: Patient Education method: Explanation, Demonstration, Tactile cues, Verbal cues, and Handouts Education comprehension: verbalized understanding, returned demonstration, verbal cues required, tactile cues required, and needs further education     PLAN: PT FREQUENCY: 1x/week   PT DURATION: 10 weeks   PLANNED INTERVENTIONS: Therapeutic exercises, Therapeutic activity, Neuromuscular re-education, Balance training, Gait training, Patient/Family education, Self Care, Joint mobilization, Spinal mobilization, Moist heat, Taping, and Manual therapy.   PLAN FOR NEXT SESSION: See clinical impression for plan     GOALS: Goals reviewed with patient? Yes  SHORT TERM GOALS: Target date: 06/13/2022    Pt will demo IND with HEP  Baseline: Not IND            Goal status: INITIAL   LONG TERM GOALS: Target date:  07/24/2022     1.Pt will demo proper deep core coordination without chest breathing and optimal excursion of diaphragm/pelvic floor in order to promote spinal stability and pelvic floor function  Baseline: dyscoordination Goal status:  INITIAL  2.  Pt will demo > 5 pt change on FOTO  to improve QOL and function  Urinary Problem baseline: 54 pts  PFDI Urinary baseline:  29pts  ____  Lumbar baseline:  57 pts  Goal status: INITIAL  3.  Pt will demo proper body mechanics in against gravity tasks and ADLs  work tasks, fitness  to minimize straining pelvic floor / back                  Baseline: not IND, improper form that places strain on pelvic floor .    Leaking  with stand <> floor                Goal status: INITIAL    4. Pt will increase gait speed > 1.2 m/s with improved mechanics to return to walking 2 mile  Baseline:  0.9 m/s , limited trunk mobility, L hip higher Goal status: INITIAL    5. Pt will report decreased use of Guard pads and wearing dribble pads when he goes out in community  Baseline: Guard pad when he goes out  Goal status: INITIAL   6. Pt will report decreased pain at mid back by < 50% when washing dishes and also decreased LBP < 50% when he drives for 4 hours Oct 30th               Baseline:   washing dishes : midback pain:  03/08/09                         Driving  4 hours: low back pain :  7/10   Goal status: INITIAL     Jerl Mina, PT 06/24/2022, 2:27 PM

## 2022-06-25 ENCOUNTER — Ambulatory Visit (INDEPENDENT_AMBULATORY_CARE_PROVIDER_SITE_OTHER): Payer: Medicare Other | Admitting: Internal Medicine

## 2022-06-25 ENCOUNTER — Encounter: Payer: Self-pay | Admitting: Internal Medicine

## 2022-06-25 VITALS — BP 110/76 | HR 78 | Ht 70.0 in | Wt 184.0 lb

## 2022-06-25 DIAGNOSIS — I700 Atherosclerosis of aorta: Secondary | ICD-10-CM

## 2022-06-25 DIAGNOSIS — N138 Other obstructive and reflux uropathy: Secondary | ICD-10-CM

## 2022-06-25 DIAGNOSIS — C672 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: Secondary | ICD-10-CM

## 2022-06-25 DIAGNOSIS — K219 Gastro-esophageal reflux disease without esophagitis: Secondary | ICD-10-CM

## 2022-06-25 DIAGNOSIS — Z23 Encounter for immunization: Secondary | ICD-10-CM | POA: Diagnosis not present

## 2022-06-25 DIAGNOSIS — J449 Chronic obstructive pulmonary disease, unspecified: Secondary | ICD-10-CM | POA: Diagnosis not present

## 2022-06-25 DIAGNOSIS — M05711 Rheumatoid arthritis with rheumatoid factor of right shoulder without organ or systems involvement: Secondary | ICD-10-CM | POA: Diagnosis not present

## 2022-06-25 DIAGNOSIS — Z87891 Personal history of nicotine dependence: Secondary | ICD-10-CM

## 2022-06-25 DIAGNOSIS — N401 Enlarged prostate with lower urinary tract symptoms: Secondary | ICD-10-CM

## 2022-06-25 DIAGNOSIS — M05712 Rheumatoid arthritis with rheumatoid factor of left shoulder without organ or systems involvement: Secondary | ICD-10-CM

## 2022-06-25 DIAGNOSIS — E782 Mixed hyperlipidemia: Secondary | ICD-10-CM

## 2022-06-25 MED ORDER — ROSUVASTATIN CALCIUM 20 MG PO TABS: 20.0000 mg | ORAL_TABLET | Freq: Every day | ORAL | 3 refills | Status: DC

## 2022-06-25 NOTE — Progress Notes (Signed)
Date:  06/25/2022   Name:  Nathan Anthony   DOB:  09-20-1955   MRN:  [id number]   Chief Complaint: Hyperlipidemia Doing well.  Since his last visit he has had prostate surgery and is going to PT for bladder control issues. Gastroesophageal Reflux He complains of heartburn. He reports no abdominal pain, no chest pain, no coughing or no wheezing. This is a recurrent problem. The problem occurs occasionally. Pertinent negatives include no fatigue. He has tried a PPI for the symptoms.  Hyperlipidemia This is a chronic problem. The problem is controlled. Pertinent negatives include no chest pain or shortness of breath. Current antihyperlipidemic treatment includes statins.  Tobacco use history - some mild shortness of breath, no wheezing.  Actually getting slowly better.  On Singulair.  Does not qualify for LDCT screening.  Lab Results  Component Value Date   NA 140 09/12/2021   K 4.2 09/12/2021   CO2 28 09/12/2021   GLUCOSE 87 09/12/2021   BUN 12 09/12/2021   CREATININE 0.75 09/12/2021   CALCIUM 9.2 09/12/2021   EGFR 95 05/10/2021   GFRNONAA >60 09/12/2021   Lab Results  Component Value Date   CHOL 141 05/10/2021   HDL 28 (L) 05/10/2021   LDLCALC 71 05/10/2021   TRIG 259 (H) 05/10/2021   CHOLHDL 5.0 05/10/2021   Lab Results  Component Value Date   TSH 2.630 05/10/2021   No results found for: "HGBA1C" Lab Results  Component Value Date   WBC 10.6 (H) 09/12/2021   HGB 13.2 09/12/2021   HCT 42.8 09/12/2021   MCV 82.9 09/12/2021   PLT 397 09/12/2021   Lab Results  Component Value Date   ALT 19 05/10/2021   AST 21 05/10/2021   ALKPHOS 96 05/10/2021   BILITOT 0.3 05/10/2021   Lab Results  Component Value Date   VD25OH 40.7 04/22/2018     Review of Systems  Constitutional:  Negative for chills, fatigue and unexpected weight change.  HENT:  Negative for nosebleeds and trouble swallowing.   Eyes:  Negative for visual disturbance.  Respiratory:  Negative for  cough, chest tightness, shortness of breath and wheezing.   Cardiovascular:  Negative for chest pain, palpitations and leg swelling.  Gastrointestinal:  Positive for heartburn. Negative for abdominal pain, constipation and diarrhea.  Genitourinary:  Positive for urgency.  Musculoskeletal:  Positive for arthralgias.  Skin:  Negative for color change.  Neurological:  Negative for dizziness, weakness, light-headedness and headaches.  Psychiatric/Behavioral:  Negative for dysphoric mood and sleep disturbance. The patient is not nervous/anxious.     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[place]) 05/03/2020   Abdominal aortic atherosclerosis ([place]) 04/11/2020   Acquired deviated nasal septum 09/23/2019   BPH with obstruction/lower urinary tract symptoms 04/22/2018   DISH (diffuse idiopathic skeletal hyperostosis) 11/14/2016   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[place]) 02/06/2016   Unilateral inguinal hernia without obstruction or gangrene 05/22/2015   GERD (gastroesophageal reflux disease) 11/15/2014   Hyperlipidemia 11/15/2014   COPD mixed type ([place]) 11/15/2014   Osteoporosis without current pathological fracture 11/15/2014   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[place]) 11/15/2014    Allergies  Allergen Reactions   Pravastatin     Other reaction(s): Nausea Only   Simvastatin     Other reaction(s): Nausea Only    Past Surgical History:  Procedure Laterality Date   BASAL CELL CARCINOMA EXCISION     COLONOSCOPY  06/06/2010   COLONOSCOPY WITH PROPOFOL N/A 06/08/2020   Procedure: COLONOSCOPY WITH PROPOFOL;  Surgeon: Lucilla Lame, MD;  Location: [facility];  Service: Endoscopy;  Laterality: N/A;  priority 4   ETHMOIDECTOMY Bilateral 07/03/2020   Procedure: ETHMOIDECTOMY;  Surgeon: Clyde Canterbury, MD;  Location: [facility];  Service: ENT;  Laterality: Bilateral;   FRONTAL SINUS EXPLORATION Bilateral 07/03/2020   Procedure: FRONTAL SINUS EXPLORATION;  Surgeon: Clyde Canterbury, MD;  Location: [facility];  Service: ENT;  Laterality: Bilateral;   HERNIA REPAIR     HOLEP-LASER ENUCLEATION OF THE PROSTATE WITH MORCELLATION N/A 09/23/2021   Procedure: HOLEP-LASER ENUCLEATION OF THE PROSTATE WITH MORCELLATION;  Surgeon: Hollice Espy, MD;  Location: [facility];  Service: Urology;  Laterality: N/A;   IMAGE GUIDED SINUS SURGERY N/A 07/03/2020   Procedure: IMAGE GUIDED SINUS SURGERY;  Surgeon: Clyde Canterbury, MD;  Location: [facility];  Service: ENT;  Laterality: N/A;  need stryker disk PLACE DISK ON OR CHARGE NURSE DESK 10-01 KP Another disk on OR charge nurse desk 10.14.21 DS   MAXILLARY ANTROSTOMY Bilateral 07/03/2020   Procedure: MAXILLARY ANTROSTOMY with tissue removal;  Surgeon: Clyde Canterbury, MD;  Location: [facility];  Service: ENT;  Laterality: Bilateral;   NASAL SEPTOPLASTY W/ TURBINOPLASTY Bilateral 07/03/2020   Procedure: NASAL SEPTOPLASTY;  Surgeon: Clyde Canterbury, MD;  Location: [facility];  Service: ENT;  Laterality: Bilateral;   SPHENOIDECTOMY Bilateral 07/03/2020   Procedure: Coralee Pesa;  Surgeon: Clyde Canterbury, MD;  Location: [facility];  Service: ENT;  Laterality: Bilateral;   TRANSURETHRAL RESECTION OF BLADDER TUMOR WITH MITOMYCIN-C N/A 06/21/2018   Procedure: TRANSURETHRAL RESECTION OF BLADDER TUMOR WITH Gemcitabine;  Surgeon: Hollice Espy, MD;  Location: [facility];  Service: Urology;  Laterality: N/A;   VARICOCELECTOMY     VARICOCELECTOMY      Social History   Tobacco Use   Smoking status: Former    Packs/day: 0.50    Years: 10.00    Total pack years: 5.00    Types: Pipe, Cigarettes    Quit date: 2019    Years since quitting: 4.8   Smokeless tobacco: Never  Vaping Use   Vaping Use: Never used  Substance Use Topics   Alcohol use: Yes     Comment: occasionally   Drug use: No     Medication list has been reviewed and updated.  Current Meds  Medication Sig   Adalimumab 40 MG/0.8ML PNKT Inject 40 mg into the skin every 14 (fourteen) days.   albuterol (PROAIR HFA) 108 (90 Base) MCG/ACT inhaler USE 1 INHALATION EVERY 6 HOURS AS NEEDED FOR WHEEZING OR SHORTNESS OF BREATH (NEEDS APPOINTMENT FOR FURTHER REFILLS)   alendronate (FOSAMAX) 70 MG tablet Take 70 mg by mouth once a week.   calcium-vitamin D 250-100 MG-UNIT tablet Take 1 tablet by mouth daily.   ketotifen (ZADITOR) 0.025 % ophthalmic solution Place 1 drop into both eyes once a week.   loratadine (CLARITIN) 10 MG tablet Take 10 mg by mouth daily.   meloxicam (MOBIC) 15 MG tablet Take 15 mg by mouth daily.   montelukast (SINGULAIR) 10 MG tablet TAKE 1 TABLET DAILY   Multiple Vitamin (MULTIVITAMIN) capsule Take 1 capsule by mouth daily.   omeprazole (PRILOSEC) 20 MG capsule TAKE 1 CAPSULE DAILY   rosuvastatin (CRESTOR) 20 MG tablet TAKE 1 TABLET DAILY   triamcinolone (NASACORT) 55 MCG/ACT AERO nasal inhaler Place  2 sprays into the nose daily.       06/25/2022    1:22 PM 05/10/2021    8:35 AM  GAD 7 : Generalized Anxiety Score  Nervous, Anxious, on Edge 0 0  Control/stop worrying 0 0  Worry too much - different things 0 0  Trouble relaxing 0 0  Restless 0 0  Easily annoyed or irritable 0 0  Afraid - awful might happen 0 0  Total GAD 7 Score 0 0  Anxiety Difficulty Not difficult at all Not difficult at all       06/25/2022    1:22 PM 05/10/2021    8:35 AM 05/03/2020    8:23 AM  Depression screen PHQ 2/9  Decreased Interest 0 0 0  Down, Depressed, Hopeless 0 0 0  PHQ - 2 Score 0 0 0  Altered sleeping 0 0 0  Tired, decreased energy 0 0 0  Change in appetite 0 0 0  Feeling bad or failure about yourself  0 0 0  Trouble concentrating 0 0 0  Moving slowly or fidgety/restless 0 0 0  Suicidal thoughts 0 0 0  PHQ-9 Score 0 0 0  Difficult doing work/chores Not  difficult at all Not difficult at all Not difficult at all    BP Readings from Last 3 Encounters:  06/25/22 110/76  05/13/22 130/79  11/05/21 (!) 154/80    Physical Exam Vitals and nursing note reviewed.  Constitutional:      General: He is not in acute distress.    Appearance: He is well-developed.  HENT:     Head: Normocephalic and atraumatic.  Cardiovascular:     Rate and Rhythm: Normal rate and regular rhythm.     Pulses: Normal pulses.  Pulmonary:     Effort: Pulmonary effort is normal. No respiratory distress.     Breath sounds: No wheezing or rhonchi.  Musculoskeletal:     Cervical back: Normal range of motion.     Right lower leg: No edema.     Left lower leg: No edema.  Skin:    General: Skin is warm and dry.     Capillary Refill: Capillary refill takes less than 2 seconds.     Findings: No rash.  Neurological:     Mental Status: He is alert and oriented to person, place, and time.  Psychiatric:        Mood and Affect: Mood normal.        Behavior: Behavior normal.     Wt Readings from Last 3 Encounters:  06/25/22 184 lb (83.5 kg)  05/13/22 184 lb (83.5 kg)  11/05/21 184 lb (83.5 kg)    BP 110/76   Pulse 78   Ht _0  (1.778 m)   Wt 184 lb (83.5 kg)   SpO2 96%   BMI 26.40 kg/m   Assessment and Plan: 1. Abdominal aortic atherosclerosis (HCC) On high intensity statin Check labs and advise - Comprehensive metabolic panel - Lipid panel - rosuvastatin (CRESTOR) 20 MG tablet; Take 1 tablet (20 mg total) by mouth daily.  Dispense: 90 tablet; Refill: 3  2. COPD mixed type ([place]) Followed by Pulmonary On Singulair and Albuterol  3. Gastroesophageal reflux disease, unspecified whether esophagitis present Symptoms well controlled on daily PPI No red flag signs such as weight loss, n/v, melena Will continue omeprazole. - Comprehensive metabolic panel  4. 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 - Comprehensive metabolic panel -  CBC with Differential/Platelet  5. Former smoker Quit pipes after bladder cancer dx  6. Mixed hyperlipidemia - rosuvastatin (CRESTOR) 20 MG tablet; Take 1 tablet (20 mg total) by mouth daily.  Dispense: 90 tablet; Refill: 3  7. Malignant neoplasm of lateral wall of urinary bladder ([place]) Followed by Urology   Partially dictated using [place]. Any errors are unintentional.  Halina Maidens, MD [facility]  06/25/2022

## 2022-06-26 LAB — CBC WITH DIFFERENTIAL/PLATELET
Basophils Absolute: 0.1 10*3/uL (ref 0.0–0.2)
Basos: 1 %
EOS (ABSOLUTE): 0.5 10*3/uL — ABNORMAL HIGH (ref 0.0–0.4)
Eos: 5 %
Hematocrit: 41.8 % (ref 37.5–51.0)
Hemoglobin: 13 g/dL (ref 13.0–17.7)
Immature Grans (Abs): 0 10*3/uL (ref 0.0–0.1)
Immature Granulocytes: 0 %
Lymphocytes Absolute: 3.2 10*3/uL — ABNORMAL HIGH (ref 0.7–3.1)
Lymphs: 30 %
MCH: 25.1 pg — ABNORMAL LOW (ref 26.6–33.0)
MCHC: 31.1 g/dL — ABNORMAL LOW (ref 31.5–35.7)
MCV: 81 fL (ref 79–97)
Monocytes Absolute: 0.8 10*3/uL (ref 0.1–0.9)
Monocytes: 7 %
Neutrophils Absolute: 6.2 10*3/uL (ref 1.4–7.0)
Neutrophils: 57 %
Platelets: 498 10*3/uL — ABNORMAL HIGH (ref 150–450)
RBC: 5.18 x10E6/uL (ref 4.14–5.80)
RDW: 14.3 % (ref 11.6–15.4)
WBC: 10.7 10*3/uL (ref 3.4–10.8)

## 2022-06-26 LAB — LIPID PANEL
Chol/HDL Ratio: 4.7 ratio (ref 0.0–5.0)
Cholesterol, Total: 145 mg/dL (ref 100–199)
HDL: 31 mg/dL — ABNORMAL LOW (ref >39)
LDL Chol Calc (NIH): 77 mg/dL (ref 0–99)
Triglycerides: 222 mg/dL — ABNORMAL HIGH (ref 0–149)
VLDL Cholesterol Cal: 37 mg/dL (ref 5–40)

## 2022-06-26 LAB — COMPREHENSIVE METABOLIC PANEL
ALT: 12 IU/L (ref 0–44)
AST: 18 IU/L (ref 0–40)
Albumin/Globulin Ratio: 1 — ABNORMAL LOW (ref 1.2–2.2)
Albumin: 3.8 g/dL — ABNORMAL LOW (ref 3.9–4.9)
Alkaline Phosphatase: 98 IU/L (ref 44–121)
BUN/Creatinine Ratio: 13 (ref 10–24)
BUN: 10 mg/dL (ref 8–27)
Bilirubin Total: 0.2 mg/dL (ref 0.0–1.2)
CO2: 24 mmol/L (ref 20–29)
Calcium: 9.4 mg/dL (ref 8.6–10.2)
Chloride: 107 mmol/L — ABNORMAL HIGH (ref 96–106)
Creatinine, Ser: 0.78 mg/dL (ref 0.76–1.27)
Globulin, Total: 4 g/dL (ref 1.5–4.5)
Glucose: 90 mg/dL (ref 70–99)
Potassium: 5.1 mmol/L (ref 3.5–5.2)
Sodium: 146 mmol/L — ABNORMAL HIGH (ref 134–144)
Total Protein: 7.8 g/dL (ref 6.0–8.5)
eGFR: 99 mL/min/{1.73_m2} (ref >59)

## 2022-07-01 ENCOUNTER — Encounter: Payer: TRICARE For Life (TFL) | Admitting: Physical Therapy

## 2022-07-02 ENCOUNTER — Ambulatory Visit: Payer: Medicare Other | Attending: Urology | Admitting: Physical Therapy

## 2022-07-02 ENCOUNTER — Ambulatory Visit: Payer: Medicare Other

## 2022-07-02 DIAGNOSIS — M533 Sacrococcygeal disorders, not elsewhere classified: Secondary | ICD-10-CM | POA: Insufficient documentation

## 2022-07-02 DIAGNOSIS — R278 Other lack of coordination: Secondary | ICD-10-CM | POA: Insufficient documentation

## 2022-07-02 DIAGNOSIS — R2689 Other abnormalities of gait and mobility: Secondary | ICD-10-CM | POA: Insufficient documentation

## 2022-07-02 DIAGNOSIS — M5459 Other low back pain: Secondary | ICD-10-CM | POA: Insufficient documentation

## 2022-07-08 ENCOUNTER — Ambulatory Visit: Payer: Medicare Other | Admitting: Physical Therapy

## 2022-07-08 DIAGNOSIS — R278 Other lack of coordination: Secondary | ICD-10-CM | POA: Diagnosis present

## 2022-07-08 DIAGNOSIS — R2689 Other abnormalities of gait and mobility: Secondary | ICD-10-CM

## 2022-07-08 DIAGNOSIS — M533 Sacrococcygeal disorders, not elsewhere classified: Secondary | ICD-10-CM | POA: Diagnosis not present

## 2022-07-08 DIAGNOSIS — M5459 Other low back pain: Secondary | ICD-10-CM | POA: Diagnosis present

## 2022-07-08 NOTE — Therapy (Signed)
OUTPATIENT PHYSICAL THERAPY Treatment / discharge across 8 visits    Patient Name: Nathan Anthony MRN: [id number] DOB:1955/11/25, 66 y.o., male Today's Date: 07/08/2022   PT End of Session - 07/08/22 1430     Visit Number 8    Number of Visits 10    Date for PT Re-Evaluation 07/24/22    PT Start Time 0177    PT Stop Time 1500    PT Time Calculation (min) 45 min    Activity Tolerance Patient tolerated treatment well;No increased pain    Behavior During Therapy WFL for tasks assessed/performed             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HCC)    Cancer (HCC)    bladder   Chronic obstructive pulmonary disease ([place]) 9/39/0300   Complication of anesthesia    Asthmatic episode when awakening from colonoscopy   DISH (diffuse idiopathic skeletal hyperostosis)    neck pain   GERD (gastroesophageal reflux disease)    Hyperlipidemia    Hyperlipidemia    Inguinal hernia    Left Side   Nasal congestion    nasal polyps   Osteopenia    Osteoporosis    RA (rheumatoid arthritis) (HCC)    shoulders and hands   Rheumatoid arthritis (HCC)    Sciatica    Sciatica    Sciatica    Tinnitus aurium, left    Past Surgical History:  Procedure Laterality Date   BASAL CELL CARCINOMA EXCISION     COLONOSCOPY  06/06/2010   COLONOSCOPY WITH PROPOFOL N/A 06/08/2020   Procedure: COLONOSCOPY WITH PROPOFOL;  Surgeon: Lucilla Lame, MD;  Location: [facility];  Service: Endoscopy;  Laterality: N/A;  priority 4   ETHMOIDECTOMY Bilateral 07/03/2020   Procedure: ETHMOIDECTOMY;  Surgeon: Clyde Canterbury, MD;  Location: [facility];  Service: ENT;  Laterality: Bilateral;   FRONTAL SINUS EXPLORATION Bilateral 07/03/2020   Procedure: FRONTAL SINUS EXPLORATION;  Surgeon: Clyde Canterbury, MD;  Location: [facility];  Service: ENT;  Laterality: Bilateral;   HERNIA REPAIR     HOLEP-LASER ENUCLEATION OF THE PROSTATE WITH  MORCELLATION N/A 09/23/2021   Procedure: HOLEP-LASER ENUCLEATION OF THE PROSTATE WITH MORCELLATION;  Surgeon: Hollice Espy, MD;  Location: [facility];  Service: Urology;  Laterality: N/A;   IMAGE GUIDED SINUS SURGERY N/A 07/03/2020   Procedure: IMAGE GUIDED SINUS SURGERY;  Surgeon: Clyde Canterbury, MD;  Location: [place];  Service: ENT;  Laterality: N/A;  need stryker disk PLACE DISK ON OR CHARGE NURSE DESK 10-01 KP Another disk on OR charge nurse desk 10.14.21 DS   MAXILLARY ANTROSTOMY Bilateral 07/03/2020   Procedure: MAXILLARY ANTROSTOMY with tissue removal;  Surgeon: Clyde Canterbury, MD;  Location: [facility];  Service: ENT;  Laterality: Bilateral;   NASAL SEPTOPLASTY W/ TURBINOPLASTY Bilateral 07/03/2020   Procedure: NASAL SEPTOPLASTY;  Surgeon: Clyde Canterbury, MD;  Location: [facility];  Service: ENT;  Laterality: Bilateral;   SPHENOIDECTOMY Bilateral 07/03/2020   Procedure: Coralee Pesa;  Surgeon: Clyde Canterbury, MD;  Location: [facility];  Service: ENT;  Laterality: Bilateral;   TRANSURETHRAL RESECTION OF BLADDER TUMOR WITH MITOMYCIN-C N/A 06/21/2018   Procedure: TRANSURETHRAL RESECTION OF BLADDER TUMOR WITH Gemcitabine;  Surgeon: Hollice Espy, MD;  Location: [facility];  Service: Urology;  Laterality: N/A;   VARICOCELECTOMY     VARICOCELECTOMY     Patient Active Problem List   Diagnosis Date Noted  Former smoker 10/10/2020   Encounter for screening colonoscopy    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[place]) 05/03/2020   Abdominal aortic atherosclerosis ([place]) 04/11/2020   Acquired deviated nasal septum 09/23/2019   BPH with obstruction/lower urinary tract symptoms 04/22/2018   DISH (diffuse idiopathic skeletal hyperostosis) 11/14/2016   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[place]) 02/06/2016   Unilateral inguinal hernia without obstruction or gangrene 05/22/2015   GERD (gastroesophageal reflux disease) 11/15/2014   Hyperlipidemia 11/15/2014   COPD  mixed type ([place]) 11/15/2014   Osteoporosis without current pathological fracture 11/15/2014   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 Pt is down from 4 pads a day to 1 pad which stays dry most of the time. Pt no longer leaks at night but still wear a pad occasionally.   2) CLBP:   Pt has had 6 weeks in a row without LBP.   3) upper back pain between shoulder blades:  Pt worked on his [place] over 83mn -1 hr at time across 5 hours with no midpain pain.   Pt has DISH (diffuse idiopathic skeletal hyperostosis)   PERTINENT HISTORY:  Hx of bladder CA, HOLEP, R inguinal hernia repair, L inguinal hernia, skin CA, Rheumatoid arthritis, COPD, and additional comorbidities: DISH (diffuse idiopathic skeletal hyperostosis)  PAIN:  Are you having pain?    PRECAUTIONS: None  WEIGHT BEARING RESTRICTIONS No  FALLS:  Has patient fallen in last 6 months? No  LIVING ENVIRONMENT: Lives with: lives alone Lives in: House/apartment Stairs: yes  Has following equipment at home: no   OCCUPATION:   retired [employer] PLOF: Independent  PATIENT GOALS   Wear pads instead of Guard pads    OBJECTIVE:    [facility] PT Assessment - 07/08/22 1457       Observation/Other Assessments   Observations upright posture      Ambulation/Gait   Gait Comments 1.30 m/s             [facility]       Observation/Other Assessments   Observations upright posture  Ambulation/Gait   Gait Comments 1.30 m/s              [facility] Adult PT Treatment/Exercise - 07/08/22 1457       Therapeutic Activites    Other Therapeutic Activities reassessed goals / discussed d/c               [facility] PT Assessment - 07/08/22 1457       Observation/Other Assessments   Observations upright posture      Ambulation/Gait   Gait Comments 1.30 m/s              [facility] Adult PT Treatment/Exercise - 07/08/22 1457       Therapeutic Activites    Other Therapeutic Activities reassessed goals / discussed d/c                 HOME EXERCISE PROGRAM: See pt instruction section    ASSESSMENT:  CLINICAL IMPRESSION:  Pt has met 100% of this goals.  Pt's improvements include:  no leakage . Pt is down from 4 pads a day to 1 pad which stays dry most of the time. Pt no longer leaks at night but still wear a pad occasionally. Pt is able to get down to the floor and play with granddtr with no leakage.   no LBP across  6 weeks in a row. able to ride motorcycle without LBP across  110 mileswith stops, driving 2 hours    no upper back pain between shoulder blades when pt worked on his [place] over 42mn -1 hr at time across 5 hours    Pt's posture and gait speed improved significantly with manual Tx.  His deep core coordination and strengthening have helped with pelvic floor dysfunctions.  Pt remained compliant with all HEP. Pt's FOTO scores for LUMBAR and Pelvic Floor score improved signficantly. Pt is ready to be d/c at this time.    OBJECTIVE IMPAIRMENTS decreased activity tolerance, decreased coordination, decreased endurance, decreased mobility, difficulty walking, decreased ROM, decreased strength, decreased safety awareness, hypomobility, increased muscle spasms, impaired flexibility, improper body mechanics, postural dysfunction, and pain.    ACTIVITY LIMITATIONS  self-care,  home chores,    PARTICIPATION LIMITATIONS:  community, playing with  granddtr on floor  (leakage occurs)    PERSONAL FACTORS  Hx of bladder CA, HOLEP, R inguinal hernia repair, L inguinal hernia, skin CA, DISH, Rheumatoid arthritis, COPD, and additional comorbidities are also affecting patient's functional outcome.    REHAB POTENTIAL: Good   CLINICAL DECISION MAKING: Evolving/moderate complexity   EVALUATION COMPLEXITY: Moderate    PATIENT EDUCATION:    Education details: Showed pt anatomy images. Explained muscles attachments/ connection, physiology of deep core system/ spinal- thoracic-pelvis-lower kinetic chain as they relate to pt's presentation, Sx, and past Hx. Explained what and how these areas of deficits need to be restored to balance and function    See Therapeutic activity / neuromuscular re-education section  Answered pt's questions.   Person educated: Patient Education method: Explanation, Demonstration, Tactile cues, Verbal cues, and Handouts Education comprehension: verbalized understanding, returned demonstration, verbal cues required, tactile cues required, and needs further education     PLAN: PT FREQUENCY: 1x/week   PT DURATION: 10 weeks   PLANNED INTERVENTIONS: Therapeutic exercises, Therapeutic activity, Neuromuscular re-education, Balance training, Gait training, Patient/Family education, Self Care, Joint mobilization, Spinal mobilization, Moist heat, Taping, and Manual therapy.   PLAN FOR NEXT SESSION: See clinical impression for plan     GOALS: Goals reviewed with patient? Yes  SHORT TERM GOALS: Target date: 06/13/2022    Pt will demo IND with HEP                    Baseline: Not IND            Goal status:MET    LONG TERM GOALS: Target date:  07/24/2022     1.Pt will demo proper deep core coordination without chest breathing and optimal excursion of diaphragm/pelvic floor in order to promote spinal stability and pelvic floor function  Baseline: dyscoordination Goal status: MET   2.  Pt will demo > 5 pt  change on FOTO  to improve QOL and function  Urinary Problem baseline: 54 pts  -->  65 pts  ( 07/08/22)   PFDI Urinary baseline:  29pts   -->  0 pts ( 07/08/22)  ____  Lumbar baseline:  57 pts --->        75 pts ( 07/08/22)   Goal status: MET   3.  Pt will demo proper body mechanics in against gravity tasks and ADLs  work tasks, fitness  to minimize straining pelvic floor / back                  Baseline: not IND, improper form that places strain on pelvic floor .    Leaking  with stand <> floor                Goal status: MET ( no leakage)     4. Pt will increase gait speed > 1.2 m/s with improved mechanics to return to walking 2 mile  Baseline:  0.9 m/s , limited trunk mobility, L hip higher Goal status: 1.3 m/s ( reciprocal gait and levelled pelvic girdle)     5. Pt will report decreased use of Guard pads and wearing dribble pads when he goes out in community  Baseline: Guard pad when he goes out  Goal status: MET ( 1 pad which stays dry most of the time)   6. Pt will report decreased pain at mid back by < 50% when washing dishes and also decreased LBP < 50% when he drives for 4 hours Oct 30th               Baseline:   washing dishes : midback pain:  03/08/09   ( 07/08/22:  1/10)                         Driving  4 hours: low back pain :  7/10     ( 07/08/22:  0/10 2 hours)   Goal status: MET     Jerl Mina, PT 07/08/2022, 2:58 PM

## 2022-07-15 ENCOUNTER — Ambulatory Visit: Payer: Medicare Other | Admitting: Physical Therapy

## 2022-07-16 ENCOUNTER — Telehealth: Payer: Self-pay | Admitting: Primary Care

## 2022-07-16 DIAGNOSIS — J4530 Mild persistent asthma, uncomplicated: Secondary | ICD-10-CM

## 2022-07-16 MED ORDER — MONTELUKAST SODIUM 10 MG PO TABS: 10.0000 mg | ORAL_TABLET | Freq: Every day | ORAL | 3 refills | Status: DC

## 2022-07-16 NOTE — Telephone Encounter (Signed)
Called and spoke with patient. Patient stated that he needed a refill on his singulair. Patient verified pharmacy. Rx has been sent to the patients pharmacy.   Nothing further needed.

## 2022-07-29 ENCOUNTER — Encounter: Payer: TRICARE For Life (TFL) | Admitting: Physical Therapy

## 2022-08-13 ENCOUNTER — Other Ambulatory Visit: Payer: Self-pay | Admitting: Internal Medicine

## 2022-08-13 DIAGNOSIS — K219 Gastro-esophageal reflux disease without esophagitis: Secondary | ICD-10-CM

## 2022-11-11 ENCOUNTER — Ambulatory Visit (INDEPENDENT_AMBULATORY_CARE_PROVIDER_SITE_OTHER): Payer: Medicare Other | Admitting: Urology

## 2022-11-11 DIAGNOSIS — N401 Enlarged prostate with lower urinary tract symptoms: Secondary | ICD-10-CM

## 2022-11-11 DIAGNOSIS — Z8551 Personal history of malignant neoplasm of bladder: Secondary | ICD-10-CM | POA: Diagnosis not present

## 2022-11-11 DIAGNOSIS — Z87438 Personal history of other diseases of male genital organs: Secondary | ICD-10-CM

## 2022-11-11 LAB — URINALYSIS, COMPLETE
Bilirubin, UA: NEGATIVE
Glucose, UA: NEGATIVE
Ketones, UA: NEGATIVE
Leukocytes,UA: NEGATIVE
Nitrite, UA: NEGATIVE
Protein,UA: NEGATIVE
Specific Gravity, UA: 1.015 (ref 1.005–1.030)
Urobilinogen, Ur: 1 mg/dL (ref 0.2–1.0)
pH, UA: 6.5 (ref 5.0–7.5)

## 2022-11-11 LAB — MICROSCOPIC EXAMINATION

## 2022-11-11 NOTE — Progress Notes (Signed)
   11/11/22   CC:  Chief Complaint  Patient presents with   Cysto    HPI: Nathan Anthony is a 67 y.o. male with a personal history of malignant neoplasm of lateral wall of urinary bladder and BPH with bladder outlet obstruction, who presents today for routine cystoscopy.   In 2019 he underwent a CT urogram which was suspicious of bladder tumor. 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   05/2019 cystoscopy revealed approx 2 mm lesion concerning for low-grade very small recurrence. He elected to undergo in office fulguration.    Cysto 09/06/19 showed a small area <1 mm at the dome of the bladder slightly posterior to the level in the midline which is slightly heaped up but no obvious papillary change.    He underwent HoLEP in 09/2021.  He is doing well.  Rare incontinence.  Excellent stream.     NED. A&Ox3.   No respiratory distress   Abd soft, NT, ND Normal phallus with bilateral descended testicles  Cystoscopy Procedure Note  Patient identification was confirmed, informed consent was obtained, and patient was prepped using Betadine solution.  Lidocaine jelly was administered per urethral meatus.     Pre-Procedure: - Inspection reveals a normal caliber ureteral meatus.  Procedure: The flexible cystoscope was introduced without difficulty - No urethral strictures/lesions are present. -Large HoLEP defect without any evidence of regrowth, well mucosalized with open bladder neck - Normal bladder neck - Bilateral ureteral orifices identified - Bladder mucosa  reveals no ulcers, tumors, or lesions.  Previous resection site at left lateral bladder wall consistent with stellate scar. - No bladder stones - Minimal trabeculation  Retroflexion unremarkable, no  evidence of residual median lobe tissue  Post-Procedure: - Patient tolerated the procedure well  Assessment/ Plan:  History of bladder cancer - NED  - Follow-up in 6 months for repeat cystoscopy   2. History of BPH -s/p HoLEP doing well   Follow-up in 6 months with cystoscopy/ PSA  Hollice Espy, MD

## 2022-11-14 ENCOUNTER — Telehealth: Payer: Self-pay | Admitting: Internal Medicine

## 2022-11-14 NOTE — Telephone Encounter (Signed)
Copied from [place] [id number]. Topic: Medicare AWV >> Nov 14, 2022  1:41 PM Devoria Glassing wrote: Called patient to schedule Medicare Annual Wellness Visit (AWV). Left message for patient to call back and schedule Medicare Annual Wellness Visit (AWV).  Last date of AWV: none  Please schedule an appointment at any time with Kirke Shaggy, LPN .  If any questions, please contact me.  Thank you ,  Sherol Dade; [place] Direct Dial: [phone]  .

## 2022-11-17 ENCOUNTER — Telehealth: Payer: Self-pay | Admitting: Internal Medicine

## 2022-11-17 NOTE — Telephone Encounter (Signed)
Contacted Safir Taulbee Vondrasek to schedule their annual wellness visit. Appointment made for 12/24/2022.  Sherol Dade; Care Guide [facility] Direct Dial: [phone]

## 2022-12-10 ENCOUNTER — Ambulatory Visit: Payer: Medicare Other

## 2022-12-24 ENCOUNTER — Ambulatory Visit (INDEPENDENT_AMBULATORY_CARE_PROVIDER_SITE_OTHER): Payer: Medicare Other

## 2022-12-24 VITALS — Ht 70.0 in | Wt 184.0 lb

## 2022-12-24 DIAGNOSIS — Z0000 Encounter for general adult medical examination without abnormal findings: Secondary | ICD-10-CM

## 2022-12-24 NOTE — Progress Notes (Signed)
I connected with  Nathan Anthony on 12/24/22 by a audio enabled telemedicine application and verified that I am speaking with the correct person using two identifiers.  Patient Location: Home  Provider Location: Office/Clinic  I discussed the limitations of evaluation and management by telemedicine. The patient expressed understanding and agreed to proceed.  Subjective:   Nathan Anthony is a 67 y.o. male who presents for an Initial Medicare Annual Wellness Visit.  Review of Systems     Cardiac Risk Factors include: advanced age (>8men, >68 women);male gender     Objective:    There were no vitals filed for this visit. There is no height or weight on file to calculate BMI.     12/24/2022   11:05 AM 05/15/2022    3:10 PM 09/23/2021    7:06 AM 09/09/2021    8:19 AM 09/19/2020    4:52 AM 07/03/2020    8:09 AM 06/08/2020    9:15 AM  Advanced Directives  Does Patient Have a Medical Advance Directive? No No No No No No No  Would patient like information on creating a medical advance directive? No - Patient declined No - Patient declined No - Patient declined  No - Patient declined No - Patient declined No - Patient declined    Current Medications (verified) Outpatient Encounter Medications as of 12/24/2022  Medication Sig   Adalimumab 40 MG/0.8ML PNKT Inject 40 mg into the skin every 14 (fourteen) days.   albuterol (PROAIR HFA) 108 (90 Base) MCG/ACT inhaler USE 1 INHALATION EVERY 6 HOURS AS NEEDED FOR WHEEZING OR SHORTNESS OF BREATH (NEEDS APPOINTMENT FOR FURTHER REFILLS)   calcium-vitamin D 250-100 MG-UNIT tablet Take 1 tablet by mouth daily.   folic acid (FOLVITE) 1 MG tablet Take 1 mg by mouth daily.   ketotifen (ZADITOR) 0.025 % ophthalmic solution Place 1 drop into both eyes once a week.   loratadine (CLARITIN) 10 MG tablet Take 10 mg by mouth daily.   meloxicam (MOBIC) 15 MG tablet Take 15 mg by mouth daily.   methotrexate (RHEUMATREX) 2.5 MG tablet SMARTSIG:7 Tablet(s)  By Mouth Once a Week   montelukast (SINGULAIR) 10 MG tablet Take 1 tablet (10 mg total) by mouth daily.   Multiple Vitamin (MULTIVITAMIN) capsule Take 1 capsule by mouth daily.   omeprazole (PRILOSEC) 20 MG capsule TAKE 1 CAPSULE DAILY   rosuvastatin (CRESTOR) 20 MG tablet Take 1 tablet (20 mg total) by mouth daily.   triamcinolone (NASACORT) 55 MCG/ACT AERO nasal inhaler Place 2 sprays into the nose daily.   No facility-administered encounter medications on file as of 12/24/2022.    Allergies (verified) Pravastatin and Simvastatin   History: Past Medical History:  Diagnosis Date   Allergy    Asthma    Calcified granuloma of lung    Cancer    bladder   Chronic obstructive pulmonary disease 04/22/2018   Complication of anesthesia    Asthmatic episode when awakening from colonoscopy   DISH (diffuse idiopathic skeletal hyperostosis)    neck pain   GERD (gastroesophageal reflux disease)    Hyperlipidemia    Hyperlipidemia    Inguinal hernia    Left Side   Nasal congestion    nasal polyps   Osteopenia    Osteoporosis    RA (rheumatoid arthritis)    shoulders and hands   Rheumatoid arthritis    Sciatica    Sciatica    Sciatica    Tinnitus aurium, left    Past Surgical History:  Procedure Laterality Date   BASAL CELL CARCINOMA EXCISION     COLONOSCOPY  06/06/2010   COLONOSCOPY WITH PROPOFOL N/A 06/08/2020   Procedure: COLONOSCOPY WITH PROPOFOL;  Surgeon: Midge Minium, MD;  Location: [facility];  Service: Endoscopy;  Laterality: N/A;  priority 4   ETHMOIDECTOMY Bilateral 07/03/2020   Procedure: ETHMOIDECTOMY;  Surgeon: Geanie Logan, MD;  Location: [facility];  Service: ENT;  Laterality: Bilateral;   FRONTAL SINUS EXPLORATION Bilateral 07/03/2020   Procedure: FRONTAL SINUS EXPLORATION;  Surgeon: Geanie Logan, MD;  Location: [facility];  Service: ENT;  Laterality: Bilateral;   HERNIA REPAIR     HOLEP-LASER ENUCLEATION OF THE PROSTATE WITH  MORCELLATION N/A 09/23/2021   Procedure: HOLEP-LASER ENUCLEATION OF THE PROSTATE WITH MORCELLATION;  Surgeon: Vanna Scotland, MD;  Location: [facility];  Service: Urology;  Laterality: N/A;   IMAGE GUIDED SINUS SURGERY N/A 07/03/2020   Procedure: IMAGE GUIDED SINUS SURGERY;  Surgeon: Geanie Logan, MD;  Location: [facility];  Service: ENT;  Laterality: N/A;  need stryker disk PLACE DISK ON OR CHARGE NURSE DESK 10-01 KP Another disk on OR charge nurse desk 10.14.21 DS   MAXILLARY ANTROSTOMY Bilateral 07/03/2020   Procedure: MAXILLARY ANTROSTOMY with tissue removal;  Surgeon: Geanie Logan, MD;  Location: [facility];  Service: ENT;  Laterality: Bilateral;   NASAL SEPTOPLASTY W/ TURBINOPLASTY Bilateral 07/03/2020   Procedure: NASAL SEPTOPLASTY;  Surgeon: Geanie Logan, MD;  Location: [facility];  Service: ENT;  Laterality: Bilateral;   SPHENOIDECTOMY Bilateral 07/03/2020   Procedure: Selina Cooley;  Surgeon: Geanie Logan, MD;  Location: [facility];  Service: ENT;  Laterality: Bilateral;   TRANSURETHRAL RESECTION OF BLADDER TUMOR WITH MITOMYCIN-C N/A 06/21/2018   Procedure: TRANSURETHRAL RESECTION OF BLADDER TUMOR WITH Gemcitabine;  Surgeon: Vanna Scotland, MD;  Location: [facility];  Service: Urology;  Laterality: N/A;   VARICOCELECTOMY     VARICOCELECTOMY     Family History  Problem Relation Age of Onset   Heart disease Mother    Asthma Mother    Heart disease Brother    Ankylosing spondylitis Brother    Social History   Socioeconomic History   Marital status: Divorced    Spouse name: Not on file   Number of children: Not on file   Years of education: Not on file   Highest education level: Not on file  Occupational History   Not on file  Tobacco Use   Smoking status: Former    Packs/day: 0.50    Years: 10.00    Additional pack years: 0.00    Total pack years: 5.00    Types: Pipe, Cigarettes    Quit date: 2019    Years since quitting: 5.3    Smokeless tobacco: Never  Vaping Use   Vaping Use: Never used  Substance and Sexual Activity   Alcohol use: Yes    Comment: occasionally   Drug use: No   Sexual activity: Yes    Birth control/protection: None  Other Topics Concern   Not on file  Social History Narrative   Not on file   Social Determinants of Health   Financial Resource Strain: Low Risk  (12/24/2022)   Overall Financial Resource Strain (CARDIA)    Difficulty of Paying Living Expenses: Not hard at all  Food Insecurity: No Food Insecurity (12/24/2022)   Hunger Vital Sign    Worried About Running Out of Food in the Last Year: Never true    Ran Out of Food in the Last  Year: Never true  Transportation Needs: No Transportation Needs (12/24/2022)   PRAPARE - Administrator, Civil Service (Medical): No    Lack of Transportation (Non-Medical): No  Physical Activity: Sufficiently Active (12/24/2022)   Exercise Vital Sign    Days of Exercise per Week: 5 days    Minutes of Exercise per Session: 60 min  Stress: No Stress Concern Present (12/24/2022)   [employer] of Occupational Health - Occupational Stress Questionnaire    Feeling of Stress : Not at all  Social Connections: Socially Isolated (12/24/2022)   Social Connection and Isolation Panel [NHANES]    Frequency of Communication with Friends and Family: More than three times a week    Frequency of Social Gatherings with Friends and Family: More than three times a week    Attends Religious Services: Never    Database administrator or Organizations: No    Attends Engineer, structural: Never    Marital Status: Divorced    Tobacco Counseling Counseling given: Not Answered   Clinical Intake:  Pre-visit preparation completed: Yes  Pain : No/denies pain     Nutritional Risks: None Diabetes: No  How often do you need to have someone help you when you read instructions, pamphlets, or other written materials from your doctor or pharmacy?: 1  - Never  Diabetic?no  Interpreter Needed?: No  Information entered by :: Kennedy Bucker, LPN   Activities of Daily Living    12/24/2022   11:05 AM 12/21/2022   10:23 AM  In your present state of health, do you have any difficulty performing the following activities:  Hearing? 0 0  Vision? 0 0  Difficulty concentrating or making decisions? 0 0  Walking or climbing stairs? 0 0  Dressing or bathing? 0 0  Doing errands, shopping? 0 0  Preparing Food and eating ? N N  Using the Toilet? N N  In the past six months, have you accidently leaked urine? Y Y  Do you have problems with loss of bowel control? N N  Managing your Medications? N N  Managing your Finances? N N  Housekeeping or managing your Housekeeping? N N    Patient Care Team: Reubin Milan, MD as PCP - General (Internal Medicine) Erin Fulling, MD as Consulting Physician (Pulmonary Disease) Sherlon Handing, MD as Consulting Physician (Endocrinology) Vanna Scotland, MD as Consulting Physician (Urology) Patterson Hammersmith, MD as Consulting Physician (Rheumatology)  Indicate any recent Medical Services you may have received from other than [facility] providers in the past year (date may be approximate).     Assessment:   This is a routine wellness examination for Rosa.  Hearing/Vision screen Hearing Screening - Comments:: No aids Vision Screening - Comments:: Glasses for driving  Dietary issues and exercise activities discussed: Current Exercise Habits: Home exercise routine, Type of exercise: walking, Time (Minutes): 60, Frequency (Times/Week): 5, Weekly Exercise (Minutes/Week): 300, Intensity: Mild   Goals Addressed             This Visit's Progress    DIET - EAT MORE FRUITS AND VEGETABLES         Depression Screen    12/24/2022   11:03 AM 06/25/2022    1:22 PM 05/10/2021    8:35 AM 05/03/2020    8:23 AM 04/27/2019    8:11 AM 04/22/2018    8:30 AM 04/22/2017   10:37 AM  PHQ 2/9 Scores  PHQ - 2 Score 0 0  0 0 0  0 0  PHQ- 9 Score 0 0 0 0       Fall Risk    12/24/2022   11:05 AM 12/21/2022   10:23 AM 06/25/2022    1:22 PM 05/10/2021    8:36 AM 04/22/2018    8:30 AM  Fall Risk   Falls in the past year? 0 0 0 1 Yes  Number falls in past yr: 0  0 0 2 or more  Injury with Fall? 0  0 1 No  Risk Factor Category      High Fall Risk  Risk for fall due to : No Fall Risks  No Fall Risks History of fall(s) History of fall(s)  Follow up Falls prevention discussed;Falls evaluation completed  Falls evaluation completed Falls evaluation completed Falls evaluation completed    FALL RISK PREVENTION PERTAINING TO THE HOME:  Any stairs in or around the home? Yes  If so, are there any without handrails? No  Home free of loose throw rugs in walkways, pet beds, electrical cords, etc? Yes  Adequate lighting in your home to reduce risk of falls? Yes   ASSISTIVE DEVICES UTILIZED TO PREVENT FALLS:  Life alert? No  Use of a cane, walker or w/c? No  Grab bars in the bathroom? No  Shower chair or bench in shower? No  Elevated toilet seat or a handicapped toilet? Yes   Cognitive Function:        12/24/2022   11:08 AM  6CIT Screen  What Year? 0 points  What month? 0 points  What time? 0 points  Count back from 20 0 points  Months in reverse 0 points  Repeat phrase 0 points  Total Score 0 points    Immunizations Immunization History  Administered Date(s) Administered   Fluad Quad(high Dose 65+) 06/25/2022   Influenza,inj,Quad PF,6+ Mos 05/22/2015, 08/07/2016, 06/25/2018, 06/21/2019, 05/03/2020, 05/10/2021   Influenza,inj,Quad PF,6-35 Mos 05/22/2015, 08/07/2016   Janssen (J&J) SARS-COV-2 Vaccination 12/12/2019   Moderna Sars-Covid-2 Vaccination 07/30/2020   OPV 10/20/1995   PNEUMOCOCCAL CONJUGATE-20 06/25/2022   Pneumococcal Polysaccharide-23 04/22/2017   Tdap 12/12/2014   Yellow Fever 06/28/1992    TDAP status: Up to date  Flu Vaccine status: Up to date  Pneumococcal vaccine status: Up  to date  Covid-19 vaccine status: Completed vaccines  Qualifies for Shingles Vaccine? Yes   Zostavax completed No   Shingrix Completed?: No.    Education has been provided regarding the importance of this vaccine. Patient has been advised to call insurance company to determine out of pocket expense if they have not yet received this vaccine. Advised may also receive vaccine at local pharmacy or Health Dept. Verbalized acceptance and understanding.  Screening Tests Health Maintenance  Topic Date Due   HIV Screening  Never done   Zoster Vaccines- Shingrix (1 of 2) Never done   COVID-19 Vaccine (3 - 2023-24 season) 05/02/2022   INFLUENZA VACCINE  04/02/2023   Medicare Annual Wellness (AWV)  12/24/2023   DTaP/Tdap/Td (2 - Td or Tdap) 12/11/2024   COLONOSCOPY (Pts 45-48yrs Insurance coverage will need to be confirmed)  06/08/2030   Pneumonia Vaccine 22+ Years old  Completed   Hepatitis C Screening  Completed   HPV VACCINES  Aged Out    Health Maintenance  Health Maintenance Due  Topic Date Due   HIV Screening  Never done   Zoster Vaccines- Shingrix (1 of 2) Never done   COVID-19 Vaccine (3 - 2023-24 season) 05/02/2022    Colorectal cancer screening: Type  of screening: Colonoscopy. Completed 06/08/20. Repeat every 10 years  Lung Cancer Screening: (Low Dose CT Chest recommended if Age 96-80 years, 30 pack-year currently smoking OR have quit w/in 15years.) does not qualify.    Additional Screening:  Hepatitis C Screening: does qualify; Completed 04/22/17  Vision Screening: Recommended annual ophthalmology exams for early detection of glaucoma and other disorders of the eye. Is the patient up to date with their annual eye exam?  No  Who is the provider or what is the name of the office in which the patient attends annual eye exams? No one If pt is not established with a provider, would they like to be referred to a provider to establish care? No .   Dental Screening: Recommended  annual dental exams for proper oral hygiene  Community Resource Referral / Chronic Care Management: CRR required this visit?  No   CCM required this visit?  No      Plan:     I have personally reviewed and noted the following in the patient's chart:   Medical and social history Use of alcohol, tobacco or illicit drugs  Current medications and supplements including opioid prescriptions. Patient is not currently taking opioid prescriptions. Functional ability and status Nutritional status Physical activity Advanced directives List of other physicians Hospitalizations, surgeries, and ER visits in previous 12 months Vitals Screenings to include cognitive, depression, and falls Referrals and appointments  In addition, I have reviewed and discussed with patient certain preventive protocols, quality metrics, and best practice recommendations. A written personalized care plan for preventive services as well as general preventive health recommendations were provided to patient.     Hal Hope, LPN   [id number]   Nurse Notes: none

## 2022-12-24 NOTE — Patient Instructions (Signed)
[name] , Thank you for taking time to come for your Medicare Wellness Visit. I appreciate your ongoing commitment to your health goals. Please review the following plan we discussed and let me know if I can assist you in the future.   These are the goals we discussed:  Goals       DIET - EAT MORE FRUITS AND VEGETABLES      Patient Stated (pt-stated)      Continue to take medications as prescribed.        This is a list of the screening recommended for you and due dates:  Health Maintenance  Topic Date Due   HIV Screening  Never done   Zoster (Shingles) Vaccine (1 of 2) Never done   COVID-19 Vaccine (3 - 2023-24 season) 05/02/2022   Flu Shot  04/02/2023   Medicare Annual Wellness Visit  12/24/2023   DTaP/Tdap/Td vaccine (2 - Td or Tdap) 12/11/2024   Colon Cancer Screening  06/08/2030   Pneumonia Vaccine  Completed   Hepatitis C Screening: USPSTF Recommendation to screen - Ages 18-79 yo.  Completed   HPV Vaccine  Aged Out    Advanced directives: no  Conditions/risks identified: none  Next appointment: Follow up in one year for your annual wellness visit. 12/30/23 @ 10:45 am by phone  Preventive Care 65 Years and Older, Male  Preventive care refers to lifestyle choices and visits with your health care provider that can promote health and wellness. What does preventive care include? A yearly physical exam. This is also called an annual well check. Dental exams once or twice a year. Routine eye exams. Ask your health care provider how often you should have your eyes checked. Personal lifestyle choices, including: Daily care of your teeth and gums. Regular physical activity. Eating a healthy diet. Avoiding tobacco and drug use. Limiting alcohol use. Practicing safe sex. Taking low doses of aspirin every day. Taking vitamin and mineral supplements as recommended by your health care provider. What happens during an annual well check? The services and screenings done by  your health care provider during your annual well check will depend on your age, overall health, lifestyle risk factors, and family history of disease. Counseling  Your health care provider may ask you questions about your: Alcohol use. Tobacco use. Drug use. Emotional well-being. Home and relationship well-being. Sexual activity. Eating habits. History of falls. Memory and ability to understand (cognition). Work and work Astronomer. Screening  You may have the following tests or measurements: Height, weight, and BMI. Blood pressure. Lipid and cholesterol levels. These may be checked every 5 years, or more frequently if you are over 16 years old. Skin check. Lung cancer screening. You may have this screening every year starting at age 59 if you have a 30-pack-year history of smoking and currently smoke or have quit within the past 15 years. Fecal occult blood test (FOBT) of the stool. You may have this test every year starting at age 23. Flexible sigmoidoscopy or colonoscopy. You may have a sigmoidoscopy every 5 years or a colonoscopy every 10 years starting at age 71. Prostate cancer screening. Recommendations will vary depending on your family history and other risks. Hepatitis C blood test. Hepatitis B blood test. Sexually transmitted disease (STD) testing. Diabetes screening. This is done by checking your blood sugar (glucose) after you have not eaten for a while (fasting). You may have this done every 1-3 years. Abdominal aortic aneurysm (AAA) screening. You may need this if you  are a current or former smoker. Osteoporosis. You may be screened starting at age 73 if you are at high risk. Talk with your health care provider about your test results, treatment options, and if necessary, the need for more tests. Vaccines  Your health care provider may recommend certain vaccines, such as: Influenza vaccine. This is recommended every year. Tetanus, diphtheria, and acellular pertussis  (Tdap, Td) vaccine. You may need a Td booster every 10 years. Zoster vaccine. You may need this after age 62. Pneumococcal 13-valent conjugate (PCV13) vaccine. One dose is recommended after age 101. Pneumococcal polysaccharide (PPSV23) vaccine. One dose is recommended after age 54. Talk to your health care provider about which screenings and vaccines you need and how often you need them. This information is not intended to replace advice given to you by your health care provider. Make sure you discuss any questions you have with your health care provider. Document Released: 09/14/2015 Document Revised: 05/07/2016 Document Reviewed: 06/19/2015 Elsevier Interactive Patient Education  2017 [employer] Prevention in the Home Falls can cause injuries. They can happen to people of all ages. There are many things you can do to make your home safe and to help prevent falls. What can I do on the outside of my home? Regularly fix the edges of walkways and driveways and fix any cracks. Remove anything that might make you trip as you walk through a door, such as a raised step or threshold. Trim any bushes or trees on the path to your home. Use bright outdoor lighting. Clear any walking paths of anything that might make someone trip, such as rocks or tools. Regularly check to see if handrails are loose or broken. Make sure that both sides of any steps have handrails. Any raised decks and porches should have guardrails on the edges. Have any leaves, snow, or ice cleared regularly. Use sand or salt on walking paths during winter. Clean up any spills in your garage right away. This includes oil or grease spills. What can I do in the bathroom? Use night lights. Install grab bars by the toilet and in the tub and shower. Do not use towel bars as grab bars. Use non-skid mats or decals in the tub or shower. If you need to sit down in the shower, use a plastic, non-slip stool. Keep the floor dry. Clean  up any water that spills on the floor as soon as it happens. Remove soap buildup in the tub or shower regularly. Attach bath mats securely with double-sided non-slip rug tape. Do not have throw rugs and other things on the floor that can make you trip. What can I do in the bedroom? Use night lights. Make sure that you have a light by your bed that is easy to reach. Do not use any sheets or blankets that are too big for your bed. They should not hang down onto the floor. Have a firm chair that has side arms. You can use this for support while you get dressed. Do not have throw rugs and other things on the floor that can make you trip. What can I do in the kitchen? Clean up any spills right away. Avoid walking on wet floors. Keep items that you use a lot in easy-to-reach places. If you need to reach something above you, use a strong step stool that has a grab bar. Keep electrical cords out of the way. Do not use floor polish or wax that makes floors slippery. If you  must use wax, use non-skid floor wax. Do not have throw rugs and other things on the floor that can make you trip. What can I do with my stairs? Do not leave any items on the stairs. Make sure that there are handrails on both sides of the stairs and use them. Fix handrails that are broken or loose. Make sure that handrails are as long as the stairways. Check any carpeting to make sure that it is firmly attached to the stairs. Fix any carpet that is loose or worn. Avoid having throw rugs at the top or bottom of the stairs. If you do have throw rugs, attach them to the floor with carpet tape. Make sure that you have a light switch at the top of the stairs and the bottom of the stairs. If you do not have them, ask someone to add them for you. What else can I do to help prevent falls? Wear shoes that: Do not have high heels. Have rubber bottoms. Are comfortable and fit you well. Are closed at the toe. Do not wear sandals. If you  use a stepladder: Make sure that it is fully opened. Do not climb a closed stepladder. Make sure that both sides of the stepladder are locked into place. Ask someone to hold it for you, if possible. Clearly mark and make sure that you can see: Any grab bars or handrails. First and last steps. Where the edge of each step is. Use tools that help you move around (mobility aids) if they are needed. These include: Canes. Walkers. Scooters. Crutches. Turn on the lights when you go into a dark area. Replace any light bulbs as soon as they burn out. Set up your furniture so you have a clear path. Avoid moving your furniture around. If any of your floors are uneven, fix them. If there are any pets around you, be aware of where they are. Review your medicines with your doctor. Some medicines can make you feel dizzy. This can increase your chance of falling. Ask your doctor what other things that you can do to help prevent falls. This information is not intended to replace advice given to you by your health care provider. Make sure you discuss any questions you have with your health care provider. Document Released: 06/14/2009 Document Revised: 01/24/2016 Document Reviewed: 09/22/2014 Elsevier Interactive Patient Education  2017 [employer].

## 2022-12-25 ENCOUNTER — Encounter: Payer: Self-pay | Admitting: Dermatology

## 2022-12-25 ENCOUNTER — Ambulatory Visit (INDEPENDENT_AMBULATORY_CARE_PROVIDER_SITE_OTHER): Payer: Medicare Other | Admitting: Dermatology

## 2022-12-25 VITALS — BP 121/77 | HR 68

## 2022-12-25 DIAGNOSIS — L821 Other seborrheic keratosis: Secondary | ICD-10-CM | POA: Diagnosis not present

## 2022-12-25 DIAGNOSIS — L570 Actinic keratosis: Secondary | ICD-10-CM | POA: Diagnosis not present

## 2022-12-25 DIAGNOSIS — Z7189 Other specified counseling: Secondary | ICD-10-CM

## 2022-12-25 DIAGNOSIS — B079 Viral wart, unspecified: Secondary | ICD-10-CM | POA: Diagnosis not present

## 2022-12-25 DIAGNOSIS — L820 Inflamed seborrheic keratosis: Secondary | ICD-10-CM

## 2022-12-25 DIAGNOSIS — L578 Other skin changes due to chronic exposure to nonionizing radiation: Secondary | ICD-10-CM | POA: Diagnosis not present

## 2022-12-25 NOTE — Patient Instructions (Addendum)
Cryotherapy Aftercare  Wash gently with soap and water everyday.   Apply Vaseline and Band-Aid daily until healed.    Recommend daily broad spectrum sunscreen SPF 30+ to sun-exposed areas, reapply every 2 hours as needed. Call for new or changing lesions.  Staying in the shade or wearing long sleeves, sun glasses (UVA+UVB protection) and wide brim hats (4-inch brim around the entire circumference of the hat) are also recommended for sun protection.     Due to recent changes in healthcare laws, you may see results of your pathology and/or laboratory studies on MyChart before the doctors have had a chance to review them. We understand that in some cases there may be results that are confusing or concerning to you. Please understand that not all results are received at the same time and often the doctors may need to interpret multiple results in order to provide you with the best plan of care or course of treatment. Therefore, we ask that you please give us 2 business days to thoroughly review all your results before contacting the office for clarification. Should we see a critical lab result, you will be contacted sooner.   If You Need Anything After Your Visit  If you have any questions or concerns for your doctor, please call our main line at [phone] and press option 4 to reach your doctor's medical assistant. If no one answers, please leave a voicemail as directed and we will return your call as soon as possible. Messages left after 4 pm will be answered the following business day.   You may also send us a message via MyChart. We typically respond to MyChart messages within 1-2 business days.  For prescription refills, please ask your pharmacy to contact our office. Our fax number is [phone].  If you have an urgent issue when the clinic is closed that cannot wait until the next business day, you can page your doctor at the number below.    Please note that while we do our best to  be available for urgent issues outside of office hours, we are not available 24/7.   If you have an urgent issue and are unable to reach us, you may choose to seek medical care at your doctor's office, retail clinic, urgent care center, or emergency room.  If you have a medical emergency, please immediately call 911 or go to the emergency department.  Pager Numbers  - Dr. Kowalski: [phone]  - Dr. Moye: [phone]  - Dr. Stewart: [phone]  In the event of inclement weather, please call our main line at [phone] for an update on the status of any delays or closures.  Dermatology Medication Tips: Please keep the boxes that topical medications come in in order to help keep track of the instructions about where and how to use these. Pharmacies typically print the medication instructions only on the boxes and not directly on the medication tubes.   If your medication is too expensive, please contact our office at [phone] option 4 or send us a message through MyChart.   We are unable to tell what your co-pay for medications will be in advance as this is different depending on your insurance coverage. However, we may be able to find a substitute medication at lower cost or fill out paperwork to get insurance to cover a needed medication.   If a prior authorization is required to get your medication covered by your insurance company, please allow us 1-2 business days to complete this process.    Drug prices often vary depending on where the prescription is filled and some pharmacies may offer cheaper prices.  The website www.goodrx.com contains coupons for medications through different pharmacies. The prices here do not account for what the cost may be with help from insurance (it may be cheaper with your insurance), but the website can give you the price if you did not use any insurance.  - You can print the associated coupon and take it with your prescription to the pharmacy.   - You may also stop by our office during regular business hours and pick up a GoodRx coupon card.  - If you need your prescription sent electronically to a different pharmacy, notify our office through [facility] MyChart or by phone at [phone] option 4.     Si Usted Necesita Algo Despus de Su Visita  Tambin puede enviarnos un mensaje a travs de MyChart. Por lo general respondemos a los mensajes de MyChart en el transcurso de 1 a 2 das hbiles.  Para renovar recetas, por favor pida a su farmacia que se ponga en contacto con nuestra oficina. Nuestro nmero de fax es el [phone].  Si tiene un asunto urgente cuando la clnica est cerrada y que no puede esperar hasta el siguiente da hbil, puede llamar/localizar a su doctor(a) al nmero que aparece a continuacin.   Por favor, tenga en cuenta que aunque hacemos todo lo posible para estar disponibles para asuntos urgentes fuera del horario de oficina, no estamos disponibles las 24 horas del da, los 7 das de la semana.   Si tiene un problema urgente y no puede comunicarse con nosotros, puede optar por buscar atencin mdica  en el consultorio de su doctor(a), en una clnica privada, en un centro de atencin urgente o en una sala de emergencias.  Si tiene una emergencia mdica, por favor llame inmediatamente al 911 o vaya a la sala de emergencias.  Nmeros de bper  - Dr. Kowalski: [phone]  - Dra. Moye: [phone]  - Dra. Stewart: [phone]  En caso de inclemencias del tiempo, por favor llame a nuestra lnea principal al [phone] para una actualizacin sobre el estado de cualquier retraso o cierre.  Consejos para la medicacin en dermatologa: Por favor, guarde las cajas en las que vienen los medicamentos de uso tpico para ayudarle a seguir las instrucciones sobre dnde y cmo usarlos. Las farmacias generalmente imprimen las instrucciones del medicamento slo en las cajas y no directamente en los tubos del  medicamento.   Si su medicamento es muy caro, por favor, pngase en contacto con nuestra oficina llamando al [phone] y presione la opcin 4 o envenos un mensaje a travs de MyChart.   No podemos decirle cul ser su copago por los medicamentos por adelantado ya que esto es diferente dependiendo de la cobertura de su seguro. Sin embargo, es posible que podamos encontrar un medicamento sustituto a menor costo o llenar un formulario para que el seguro cubra el medicamento que se considera necesario.   Si se requiere una autorizacin previa para que su compaa de seguros cubra su medicamento, por favor permtanos de 1 a 2 das hbiles para completar este proceso.  Los precios de los medicamentos varan con frecuencia dependiendo del lugar de dnde se surte la receta y alguna farmacias pueden ofrecer precios ms baratos.  El sitio web www.goodrx.com tiene cupones para medicamentos de diferentes farmacias. Los precios aqu no tienen en cuenta lo que podra costar con la ayuda del seguro (puede ser ms   barato con su seguro), pero el sitio web puede darle el precio si no utiliz ningn seguro.  - Puede imprimir el cupn correspondiente y llevarlo con su receta a la farmacia.  - Tambin puede pasar por nuestra oficina durante el horario de atencin regular y recoger una tarjeta de cupones de GoodRx.  - Si necesita que su receta se enve electrnicamente a una farmacia diferente, informe a nuestra oficina a travs de MyChart de [facility] o por telfono llamando al [phone] y presione la opcin 4.  

## 2022-12-25 NOTE — Progress Notes (Signed)
Follow-Up Visit   Subjective  Nathan Anthony is a 67 y.o. male who presents for the following: Spots. Scalp. Hx of AK's Check wart on left index finger. Used OTC Freeze treatment last summer, went away, has recurred.  The patient has spots, moles and lesions to be evaluated, some may be new or changing and the patient has concerns that these could be cancer.  The following portions of the chart were reviewed this encounter and updated as appropriate: medications, allergies, medical history  Review of Systems:  No other skin or systemic complaints except as noted in HPI or Assessment and Plan.  Objective  Well appearing patient in no apparent distress; mood and affect are within normal limits.  A focused examination was performed of the following areas: Scalp, face, ears, neck, arms, hands, fingers Relevant physical exam findings are noted in the Assessment and Plan.   Assessment & Plan   ACTINIC DAMAGE - chronic, secondary to cumulative UV radiation exposure/sun exposure over time - diffuse scaly erythematous macules with underlying dyspigmentation - Recommend daily broad spectrum sunscreen SPF 30+ to sun-exposed areas, reapply every 2 hours as needed.  - Recommend staying in the shade or wearing long sleeves, sun glasses (UVA+UVB protection) and wide brim hats (4-inch brim around the entire circumference of the hat). - Call for new or changing lesions.  SEBORRHEIC KERATOSIS - Stuck-on, waxy, tan-brown papules and/or plaques  - Benign-appearing - Discussed benign etiology and prognosis. - Observe - Call for any changes  ACTINIC KERATOSIS Exam: Erythematous thin papules/macules with gritty scale  Actinic keratoses are precancerous spots that appear secondary to cumulative UV radiation exposure/sun exposure over time. They are chronic with expected duration over 1 year. A portion of actinic keratoses will progress to squamous cell carcinoma of the skin. It is not possible to  reliably predict which spots will progress to skin cancer and so treatment is recommended to prevent development of skin cancer.  Recommend daily broad spectrum sunscreen SPF 30+ to sun-exposed areas, reapply every 2 hours as needed.  Recommend staying in the shade or wearing long sleeves, sun glasses (UVA+UVB protection) and wide brim hats (4-inch brim around the entire circumference of the hat). Call for new or changing lesions.  Treatment Plan:  Prior to procedure, discussed risks of blister formation, small wound, skin dyspigmentation, or rare scar following cryotherapy. Recommend Vaseline ointment to treated areas while healing.  Destruction Procedure Note Destruction method: cryotherapy   Informed consent: discussed and consent obtained   Lesion destroyed using liquid nitrogen: Yes   Outcome: patient tolerated procedure well with no complications   Post-procedure details: wound care instructions given   Locations: scalp x6 # of Lesions Treated: 6  INFLAMED SEBORRHEIC KERATOSIS Exam: Erythematous keratotic or waxy stuck-on papule or plaque.  Symptomatic, irritating, patient would like treated.  Benign-appearing.  Call clinic for new or changing lesions.   Prior to procedure, discussed risks of blister formation, small wound, skin dyspigmentation, or rare scar following treatment. Recommend Vaseline ointment to treated areas while healing.  Destruction Procedure Note Destruction method: cryotherapy   Informed consent: discussed and consent obtained   Lesion destroyed using liquid nitrogen: Yes   Outcome: patient tolerated procedure well with no complications   Post-procedure details: wound care instructions given   Locations: scalp x2 # of Lesions Treated: 2  WART Exam: verrucous papule at L-2 palmar finger.  Counseling Discussed viral / HPV (Human Papilloma Virus) etiology and risk of spread /infectivity to other areas of  body as well as to other people.  Multiple  treatments and methods may be required to clear warts and it is possible treatment may not be successful.  Treatment risks include discoloration; scarring and there is still potential for wart recurrence.  Treatment Plan: Patient prefers to use OTC Freeze Off.   Return in about 6 months (around 06/26/2023) for TBSE As Scheduled.  I, Lawson Radar, CMA, am acting as scribe for Armida Sans, MD.  Documentation: I have reviewed the above documentation for accuracy and completeness, and I agree with the above.  Armida Sans, MD

## 2023-01-02 ENCOUNTER — Encounter: Payer: Self-pay | Admitting: Dermatology

## 2023-01-25 IMAGING — CR DG CHEST 2V
2 series · 2 of 2 positions shown · non-contrast
Comparison: None.

CLINICAL DATA: COPD, cough

EXAM:
CHEST - 2 VIEW

[chest pa]
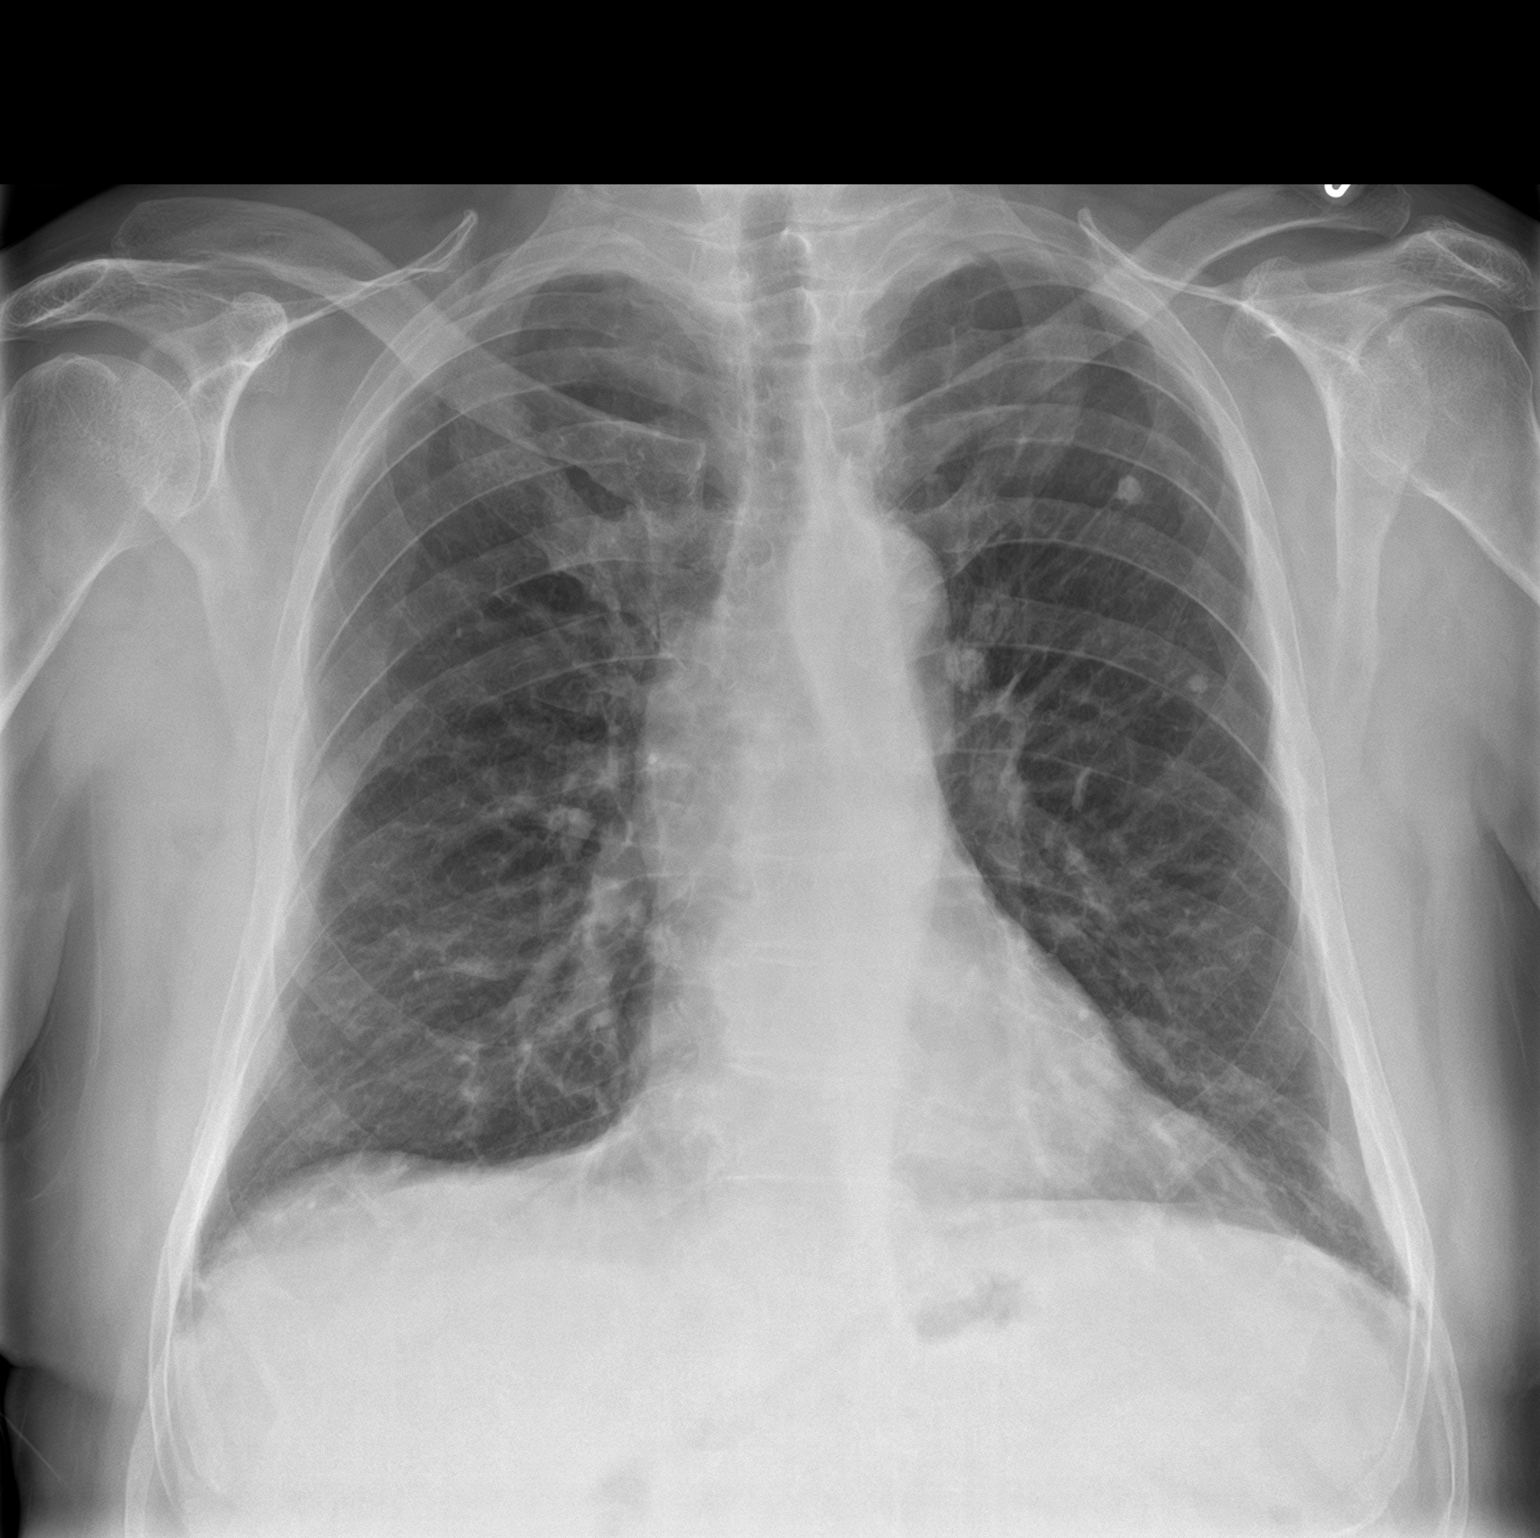

[chest lat]
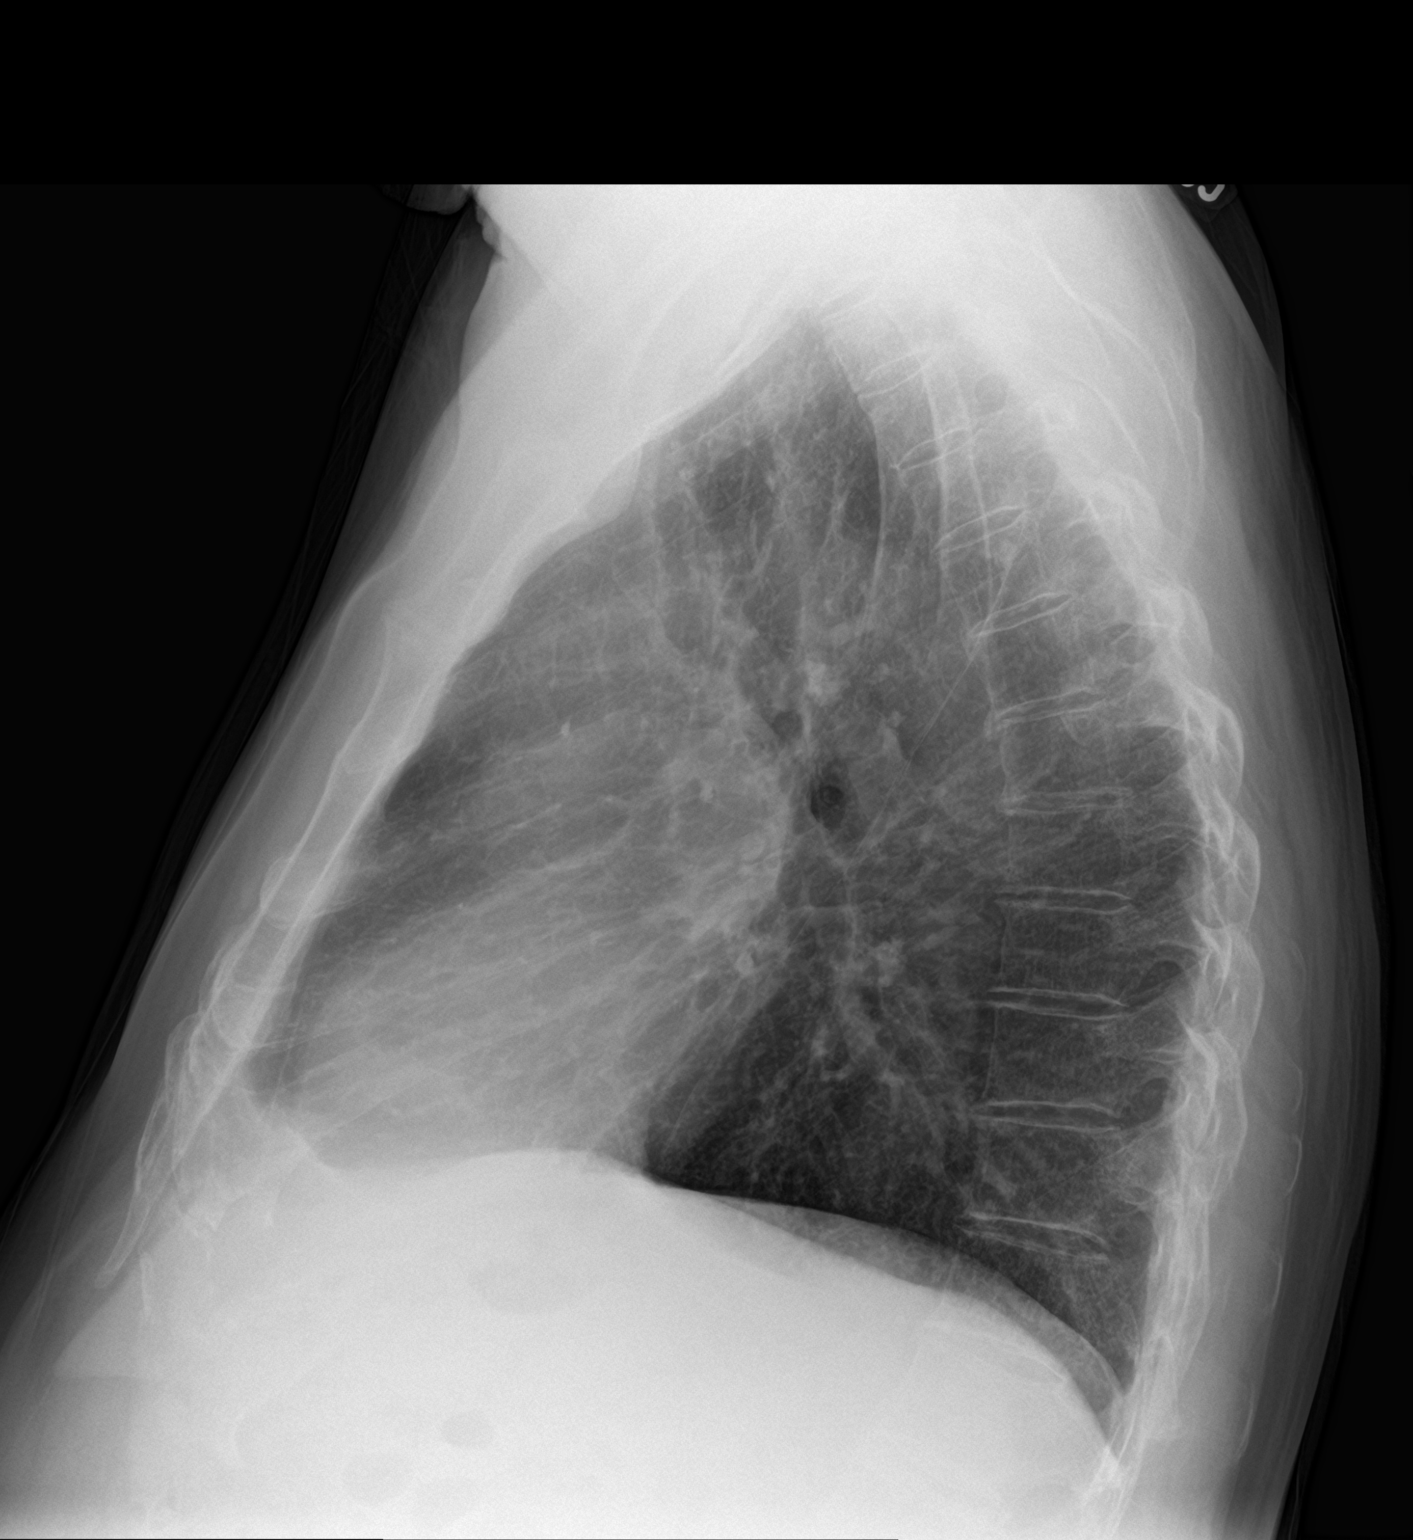

[2 of 2 positions shown; findings below may reference images not displayed]

FINDINGS: The lungs are symmetrically well expanded. Bronchitic changes are
noted centrally. Mild lingular atelectasis or infiltrate is present.
Benign calcified granuloma are noted within the left upper lobe. No
pneumothorax or pleural effusion. Cardiac size within normal limits.
IMPRESSION: Mild lingular atelectasis or infiltrate. A follow-up chest
radiograph in 3-4 weeks, following conservative therapy, would be
helpful in documenting resolution.

## 2023-03-13 ENCOUNTER — Ambulatory Visit
Admission: RE | Admit: 2023-03-13 | Discharge: 2023-03-13 | Disposition: A | Discharge status: Home / Self Care | Payer: Medicare Other | Source: Ambulatory Visit | Attending: Primary Care | Admitting: Primary Care

## 2023-03-13 ENCOUNTER — Ambulatory Visit: Payer: Medicare Other | Admitting: Primary Care

## 2023-03-13 ENCOUNTER — Ambulatory Visit
Admission: RE | Admit: 2023-03-13 | Discharge: 2023-03-13 | Disposition: A | Discharge status: Home / Self Care | Payer: Medicare Other | Attending: Primary Care | Admitting: Primary Care

## 2023-03-13 ENCOUNTER — Encounter: Payer: Self-pay | Admitting: Primary Care

## 2023-03-13 VITALS — BP 130/86 | HR 71 | Temp 98.2°F | Ht 70.0 in | Wt 187.0 lb

## 2023-03-13 DIAGNOSIS — J449 Chronic obstructive pulmonary disease, unspecified: Secondary | ICD-10-CM | POA: Insufficient documentation

## 2023-03-13 DIAGNOSIS — M05712 Rheumatoid arthritis with rheumatoid factor of left shoulder without organ or systems involvement: Secondary | ICD-10-CM

## 2023-03-13 DIAGNOSIS — M05711 Rheumatoid arthritis with rheumatoid factor of right shoulder without organ or systems involvement: Secondary | ICD-10-CM | POA: Insufficient documentation

## 2023-03-13 DIAGNOSIS — J8410 Pulmonary fibrosis, unspecified: Secondary | ICD-10-CM

## 2023-03-13 NOTE — Progress Notes (Signed)
@Patient  ID: Nathan Anthony, male    DOB: [birth year], 67 y.o.   MRN: [id number]  Chief Complaint  Patient presents with   Follow-up    Referring provider: Reubin Milan, MD  HPI: 67 year old male, former smoker.  Past medical history significant for mild persistent asthma, nasal septum deviation, aortic arthrosclerosis, GERD, rheumatoid arthritis, bladder cancer, calcified granuloma of lung, hyperlipidemia.  Patient of Dr. Belia Heman.  Consistent with moderate COPD.  Sleep study negative for OSA.   Previous [facility] pulmonary encounter:  10/10/2020 Presents today for annual follow-up.  He has been off Advair for the last 1 to 2 years. Feels his breathing has been remarkably better since sinus surgery. Asthma still causes some problems especially with weather changes. He is still using Albuterol rescue inhaler on average 6 times a month. Sometimes he will not use it for a whole month. Other times he will need to use it daily. His breathing is no worse than it has been the last 5 years. He feels better since being off maintenance ICS/LABA inhaler.  He takes Singulair, notices a difference when he does not take it. CAT score 9   10/30/2021 Patient presents today for 1 year follow-up. He had prostate surgery in January 2023. He is doing well. States that his breathing is the best its been since 1995. He is currently on Singulair and prn albuterol only. Breathing symptoms are worse with weather changes. He has been using his rescue inhaler more recently, max 2-3 times a week. He has occasional coughing spasms with phlegm which is new the last 6 months. PFT in May 2018 showed moderate obstructive airways disease with significant BD response. [facility] [employer] d/t asthma. He was on Advair for a long time. He does not feel he needs to go back on maintenance inhaler. He had previous headaches with Advair. CAT score 7.   03/13/2023 Doing well on Singulair and PRN Albuterol. Extreme change in  weather/temperature causes him to have increased chest congestion. Asthma symptoms do not typically flare. He uses SABA routinely most morning. He does not feel he needs a routine maintenance inhaler. He follows with rheumatology. Maintained on methotrexate. Arthritis symptoms are currently well controlled.    Allergies  Allergen Reactions   Pravastatin     Other reaction(s): Nausea Only   Simvastatin     Other reaction(s): Nausea Only    Immunization History  Administered Date(s) Administered   Fluad Quad(high Dose 65+) 06/25/2022   Influenza,inj,Quad PF,6+ Mos 05/22/2015, 08/07/2016, 06/25/2018, 06/21/2019, 05/03/2020, 05/10/2021   Influenza,inj,Quad PF,6-35 Mos 05/22/2015, 08/07/2016   Janssen (J&J) SARS-COV-2 Vaccination 12/12/2019   Moderna Sars-Covid-2 Vaccination 07/30/2020   OPV 10/20/1995   PNEUMOCOCCAL CONJUGATE-20 06/25/2022   Pneumococcal Polysaccharide-23 04/22/2017   Tdap 12/12/2014   Yellow Fever 06/28/1992    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HCC)    Cancer (HCC)    bladder   Chronic obstructive pulmonary disease (HCC) 04/22/2018   Complication of anesthesia    Asthmatic episode when awakening from colonoscopy   DISH (diffuse idiopathic skeletal hyperostosis)    neck pain   GERD (gastroesophageal reflux disease)    Hyperlipidemia    Hyperlipidemia    Inguinal hernia    Left Side   Nasal congestion    nasal polyps   Osteopenia    Osteoporosis    RA (rheumatoid arthritis) (HCC)    shoulders and hands  Rheumatoid arthritis (HCC)    Sciatica    Sciatica    Sciatica    Tinnitus aurium, left     Tobacco History: Social History   Tobacco Use  Smoking Status Former   Current packs/day: 0.00   Average packs/day: 0.5 packs/day for 10.0 years (5.0 ttl pk-yrs)   Types: Pipe, Cigarettes   Start date: 02/13/1974   Quit date: 2019   Years since quitting: 5.5  Smokeless Tobacco Never    Counseling given: Not Answered   Outpatient Medications Prior to Visit  Medication Sig Dispense Refill   Adalimumab 40 MG/0.8ML PNKT Inject 40 mg into the skin every 14 (fourteen) days.     albuterol (PROAIR HFA) 108 (90 Base) MCG/ACT inhaler USE 1 INHALATION EVERY 6 HOURS AS NEEDED FOR WHEEZING OR SHORTNESS OF BREATH (NEEDS APPOINTMENT FOR FURTHER REFILLS) 25.5 g 3   calcium-vitamin D 250-100 MG-UNIT tablet Take 1 tablet by mouth daily.     folic acid (FOLVITE) 1 MG tablet Take 1 mg by mouth daily.     ketotifen (ZADITOR) 0.025 % ophthalmic solution Place 1 drop into both eyes once a week.     loratadine (CLARITIN) 10 MG tablet Take 10 mg by mouth daily.     meloxicam (MOBIC) 15 MG tablet Take 15 mg by mouth daily.     methotrexate (RHEUMATREX) 2.5 MG tablet SMARTSIG:7 Tablet(s) By Mouth Once a Week     montelukast (SINGULAIR) 10 MG tablet Take 1 tablet (10 mg total) by mouth daily. 90 tablet 3   Multiple Vitamin (MULTIVITAMIN) capsule Take 1 capsule by mouth daily.     omeprazole (PRILOSEC) 20 MG capsule TAKE 1 CAPSULE DAILY 90 capsule 3   rosuvastatin (CRESTOR) 20 MG tablet Take 1 tablet (20 mg total) by mouth daily. 90 tablet 3   triamcinolone (NASACORT) 55 MCG/ACT AERO nasal inhaler Place 2 sprays into the nose daily.     No facility-administered medications prior to visit.    Review of Systems  Review of Systems  Constitutional: Negative.   Respiratory: Negative.     Physical Exam  BP 130/86 (BP Location: Left Arm, Patient Position: Sitting, Cuff Size: Normal)   Pulse 71   Temp 98.2 F (36.8 C) (Oral)   Ht 5\' 10"  (1.778 m)   Wt 187 lb (84.8 kg)   SpO2 96%   BMI 26.83 kg/m  Physical Exam Constitutional:      Appearance: Normal appearance.  HENT:     Head: Normocephalic and atraumatic.     Mouth/Throat:     Mouth: Mucous membranes are moist.     Pharynx: Oropharynx is clear.  Cardiovascular:     Rate and Rhythm: Normal rate and regular rhythm.  Pulmonary:      Effort: Pulmonary effort is normal.     Breath sounds: No wheezing or rhonchi.     Comments: Distant crackles left base, otherwise clear. No wheezing or rhonchi Skin:    General: Skin is warm and dry.  Neurological:     General: No focal deficit present.     Mental Status: He is alert and oriented to person, place, and time. Mental status is at baseline.  Psychiatric:        Mood and Affect: Mood normal.        Behavior: Behavior normal.        Thought Content: Thought content normal.        Judgment: Judgment normal.      Lab Results:  CBC  Component Value Date/Time   WBC 10.7 06/25/2022 1357   WBC 10.6 (H) 09/12/2021 1339   RBC 5.18 06/25/2022 1357   RBC 5.16 09/12/2021 1339   HGB 13.0 06/25/2022 1357   HCT 41.8 06/25/2022 1357   PLT 498 (H) 06/25/2022 1357   MCV 81 06/25/2022 1357   MCH 25.1 (L) 06/25/2022 1357   MCH 25.6 (L) 09/12/2021 1339   MCHC 31.1 (L) 06/25/2022 1357   MCHC 30.8 09/12/2021 1339   RDW 14.3 06/25/2022 1357   LYMPHSABS 3.2 (H) 06/25/2022 1357   MONOABS 0.6 01/05/2017 0902   EOSABS 0.5 (H) 06/25/2022 1357   BASOSABS 0.1 06/25/2022 1357    BMET    Component Value Date/Time   NA 146 (H) 06/25/2022 1357   K 5.1 06/25/2022 1357   CL 107 (H) 06/25/2022 1357   CO2 24 06/25/2022 1357   GLUCOSE 90 06/25/2022 1357   GLUCOSE 87 09/12/2021 1339   BUN 10 06/25/2022 1357   CREATININE 0.78 06/25/2022 1357   CALCIUM 9.4 06/25/2022 1357   GFRNONAA >60 09/12/2021 1339   GFRAA 109 05/03/2020 0937    BNP No results found for: "BNP"  ProBNP No results found for: "PROBNP"  Imaging: DG Chest 2 View  Result Date: 03/13/2023 CLINICAL DATA:  Provided history: COPD. Additional history provided: The patient reports history of asthma. EXAM: CHEST - 2 VIEW COMPARISON:  Prior chest radiographs 12/12/2021 and earlier. FINDINGS: Heart size within normal limits. As before, there is a calcified mediastinal/hilar lymph node on the left and two calcified  granulomas within the upper left lung. No appreciable airspace consolidation. No evidence of pleural effusion or pneumothorax. No acute osseous abnormality identified. Degenerative changes of the spine. IMPRESSION: 1. No evidence of an acute cardiopulmonary abnormality. 2. Redemonstrated calcified mediastinal/hilar lymph node on the left, and calcified granulomas within the left lung, likely reflecting sequelae of prior granulomatous disease. Electronically Signed   By: Jackey Loge D.O.   On: 03/13/2023 11:28     Assessment & Plan:   COPD mixed type (HCC) - Stable interval; Doing well on Singulair and prn Albuterol. No recent exacerbations. Weather changes causes increased congestion. Using SABA once daily. Continue current medication regimen. We will get a routine CXR today and recommend getting updated breathing test prior to next follow-up  Orders: CXR today Spirometry in 6 months   Follow-up 6 months with Dr. Belia Heman or sooner if needed      Glenford Bayley, NP 03/16/2023

## 2023-03-13 NOTE — Patient Instructions (Signed)
  Continue current medication regimen Routine CXR today and recommend getting updated breathing test prior to next follow-up  Orders: CXR today Spirometry in 6 months   Follow-up 6 months with Dr. Belia Heman or sooner if needed

## 2023-03-16 NOTE — Assessment & Plan Note (Signed)
-   Stable interval; Doing well on Singulair and prn Albuterol. No recent exacerbations. Weather changes causes increased congestion. Using SABA once daily. Continue current medication regimen. We will get a routine CXR today and recommend getting updated breathing test prior to next follow-up  Orders: CXR today Spirometry in 6 months   Follow-up 6 months with Dr. Belia Heman or sooner if needed

## 2023-05-13 ENCOUNTER — Encounter: Payer: Self-pay | Admitting: Urology

## 2023-05-13 ENCOUNTER — Ambulatory Visit (INDEPENDENT_AMBULATORY_CARE_PROVIDER_SITE_OTHER): Payer: Medicare Other | Admitting: Urology

## 2023-05-13 VITALS — BP 171/70 | HR 64 | Ht 70.0 in | Wt 189.3 lb

## 2023-05-13 DIAGNOSIS — Z8551 Personal history of malignant neoplasm of bladder: Secondary | ICD-10-CM | POA: Diagnosis not present

## 2023-05-13 DIAGNOSIS — N393 Stress incontinence (female) (male): Secondary | ICD-10-CM

## 2023-05-13 DIAGNOSIS — Z87438 Personal history of other diseases of male genital organs: Secondary | ICD-10-CM

## 2023-05-13 DIAGNOSIS — N138 Other obstructive and reflux uropathy: Secondary | ICD-10-CM

## 2023-05-13 LAB — URINALYSIS, COMPLETE
Bilirubin, UA: NEGATIVE
Glucose, UA: NEGATIVE
Ketones, UA: NEGATIVE
Leukocytes,UA: NEGATIVE
Nitrite, UA: NEGATIVE
Protein,UA: NEGATIVE
RBC, UA: NEGATIVE
Specific Gravity, UA: 1.01 (ref 1.005–1.030)
Urobilinogen, Ur: 0.2 mg/dL (ref 0.2–1.0)
pH, UA: 6.5 (ref 5.0–7.5)

## 2023-05-13 LAB — MICROSCOPIC EXAMINATION: Bacteria, UA: NONE SEEN

## 2023-05-13 NOTE — Progress Notes (Signed)
   05/13/23   CC:  Chief Complaint  Patient presents with   Cysto    HPI: Nathan Anthony is a 67 y.o. male with a personal history of malignant neoplasm of lateral wall of urinary bladder and BPH with bladder outlet obstruction, who presents today for routine cystoscopy.   In 2019 he underwent a CT urogram which was suspicious of bladder tumor. 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   05/2019 cystoscopy revealed approx 2 mm lesion concerning for low-grade very small recurrence. He elected to undergo in office fulguration.    Cysto 09/06/19 showed a small area <1 mm at the dome of the bladder slightly posterior to the level in the midline which is slightly heaped up but no obvious papillary change.    He underwent HoLEP in 09/2021.  He is doing well.  No urinary complaints today.     NED. A&Ox3.   No respiratory distress   Abd soft, NT, ND Normal phallus with bilateral descended testicles  Cystoscopy Procedure Note  Patient identification was confirmed, informed consent was obtained, and patient was prepped using Betadine solution.  Lidocaine jelly was administered per urethral meatus.     Pre-Procedure: - Inspection reveals a normal caliber ureteral meatus.  Procedure: The flexible cystoscope was introduced without difficulty - No urethral strictures/lesions are present. -Large HoLEP defect without any evidence of regrowth, well mucosalized with open bladder neck - Normal bladder neck - Bilateral ureteral orifices identified - Bladder mucosa  reveals no ulcers, tumors, or lesions.  Previous resection site at left lateral bladder wall consistent with stellate scar. - No bladder stones - Minimal trabeculation  Retroflexion unremarkable, no evidence  of residual median lobe tissue  Post-Procedure: - Patient tolerated the procedure well  Assessment/ Plan:  History of bladder cancer - NED  - Transition ton annual  cysto  2. History of BPH -s/p HoLEP doing well - PSA drawn today, pending  Follow-up in 12 months with cystoscopy  Vanna Scotland, MD

## 2023-05-14 LAB — PSA: Prostate Specific Ag, Serum: 0.2 ng/mL (ref 0.0–4.0)

## 2023-06-13 ENCOUNTER — Other Ambulatory Visit: Payer: Self-pay | Admitting: Internal Medicine

## 2023-06-13 DIAGNOSIS — E782 Mixed hyperlipidemia: Secondary | ICD-10-CM

## 2023-06-13 DIAGNOSIS — I700 Atherosclerosis of aorta: Secondary | ICD-10-CM

## 2023-07-01 ENCOUNTER — Ambulatory Visit (INDEPENDENT_AMBULATORY_CARE_PROVIDER_SITE_OTHER): Payer: Medicare Other | Admitting: Internal Medicine

## 2023-07-01 ENCOUNTER — Encounter: Payer: Self-pay | Admitting: Internal Medicine

## 2023-07-01 VITALS — BP 118/74 | HR 62 | Ht 70.0 in | Wt 187.0 lb

## 2023-07-01 DIAGNOSIS — M05711 Rheumatoid arthritis with rheumatoid factor of right shoulder without organ or systems involvement: Secondary | ICD-10-CM

## 2023-07-01 DIAGNOSIS — M810 Age-related osteoporosis without current pathological fracture: Secondary | ICD-10-CM | POA: Diagnosis not present

## 2023-07-01 DIAGNOSIS — Z23 Encounter for immunization: Secondary | ICD-10-CM

## 2023-07-01 DIAGNOSIS — E782 Mixed hyperlipidemia: Secondary | ICD-10-CM

## 2023-07-01 DIAGNOSIS — K219 Gastro-esophageal reflux disease without esophagitis: Secondary | ICD-10-CM | POA: Diagnosis not present

## 2023-07-01 DIAGNOSIS — C672 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: Secondary | ICD-10-CM

## 2023-07-01 DIAGNOSIS — I700 Atherosclerosis of aorta: Secondary | ICD-10-CM

## 2023-07-01 DIAGNOSIS — M05712 Rheumatoid arthritis with rheumatoid factor of left shoulder without organ or systems involvement: Secondary | ICD-10-CM

## 2023-07-01 MED ORDER — SHINGRIX 50 MCG/0.5ML IM SUSR
0.5000 mL | Freq: Once | INTRAMUSCULAR | 1 refills | Status: AC
Start: 2023-07-01 — End: 2023-07-01

## 2023-07-01 NOTE — Assessment & Plan Note (Signed)
LDL is  Lab Results  Component Value Date   LDLCALC 77 06/25/2022  Currently being treated with Crestor with good compliance and no concerns.

## 2023-07-01 NOTE — Assessment & Plan Note (Addendum)
Treated with Prolia in the past as well as Fosamax and followed by Endo Continues on calcium and vitamin D. Will get DEXA next year to assess.

## 2023-07-01 NOTE — Progress Notes (Signed)
Date:  07/01/2023   Name:  Nathan Anthony   DOB:  Jul 22, 1956   MRN:  [id number]   Chief Complaint: Annual Exam Nathan Anthony is a 67 y.o. male who presents today for his Complete Annual Exam. He feels fairly well. He reports exercising. He reports he is sleeping fairly well.   Colonoscopy: 06/2020 repeat 10 yrs  Immunization History  Administered Date(s) Administered   Fluad Quad(high Dose 65+) 06/25/2022   Fluad Trivalent(High Dose 65+) 07/01/2023   Influenza,inj,Quad PF,6+ Mos 05/22/2015, 08/07/2016, 06/25/2018, 06/21/2019, 05/03/2020, 05/10/2021   Influenza,inj,Quad PF,6-35 Mos 05/22/2015, 08/07/2016   Janssen (J&J) SARS-COV-2 Vaccination 12/12/2019   Moderna Sars-Covid-2 Vaccination 07/30/2020   OPV 10/20/1995   PNEUMOCOCCAL CONJUGATE-20 06/25/2022   Pneumococcal Polysaccharide-23 04/22/2017   Tdap 12/12/2014   Yellow Fever 06/28/1992   Health Maintenance Due  Topic Date Due   Zoster Vaccines- Shingrix (1 of 2) Never done   COVID-19 Vaccine (3 - 2023-24 season) 05/03/2023    Lab Results  Component Value Date   PSA1 0.2 05/13/2023   PSA1 0.2 05/13/2022   PSA1 0.7 05/10/2021    Hyperlipidemia This is a chronic problem. The problem is controlled. Pertinent negatives include no chest pain, myalgias or shortness of breath. Current antihyperlipidemic treatment includes statins. The current treatment provides significant improvement of lipids.  Gastroesophageal Reflux He complains of heartburn. He reports no abdominal pain, no chest pain, no choking or no wheezing. This is a recurrent problem. The problem occurs rarely. Pertinent negatives include no fatigue. He has tried a PPI for the symptoms.  Asthma There is no shortness of breath or wheezing. This is a recurrent problem. Associated symptoms include heartburn. Pertinent negatives include no appetite change, chest pain, headaches, myalgias or trouble swallowing. His symptoms are alleviated by beta-agonist and  leukotriene antagonist. He reports significant improvement on treatment. His past medical history is significant for asthma.  Osteoporosis - previously on Prolia q 6 mo from Endo.  Also on Vitamin D for deficiency.  RA - on methotrexate and Humira.  Review of Systems  Constitutional:  Negative for appetite change, chills, diaphoresis, fatigue and unexpected weight change.  HENT:  Negative for hearing loss, tinnitus, trouble swallowing and voice change.   Eyes:  Negative for visual disturbance.  Respiratory:  Negative for choking, shortness of breath and wheezing.   Cardiovascular:  Negative for chest pain, palpitations and leg swelling.  Gastrointestinal:  Positive for heartburn. Negative for abdominal pain, blood in stool, constipation and diarrhea.  Genitourinary:  Negative for difficulty urinating, dysuria and frequency.  Musculoskeletal:  Positive for arthralgias and back pain. Negative for myalgias.  Skin:  Negative for color change and rash.  Neurological:  Negative for dizziness, syncope and headaches.  Hematological:  Negative for adenopathy.  Psychiatric/Behavioral:  Negative for dysphoric mood and sleep disturbance. The patient is not nervous/anxious.      Lab Results  Component Value Date   NA 146 (H) 06/25/2022   K 5.1 06/25/2022   CO2 24 06/25/2022   GLUCOSE 90 06/25/2022   BUN 10 06/25/2022   CREATININE 0.78 06/25/2022   CALCIUM 9.4 06/25/2022   EGFR 99 06/25/2022   GFRNONAA >60 09/12/2021   Lab Results  Component Value Date   CHOL 145 06/25/2022   HDL 31 (L) 06/25/2022   LDLCALC 77 06/25/2022   TRIG 222 (H) 06/25/2022   CHOLHDL 4.7 06/25/2022   Lab Results  Component Value Date   TSH 2.630 05/10/2021   No results  found for: "HGBA1C" Lab Results  Component Value Date   WBC 10.7 06/25/2022   HGB 13.0 06/25/2022   HCT 41.8 06/25/2022   MCV 81 06/25/2022   PLT 498 (H) 06/25/2022   Lab Results  Component Value Date   ALT 12 06/25/2022   AST 18  06/25/2022   ALKPHOS 98 06/25/2022   BILITOT 0.2 06/25/2022   Lab Results  Component Value Date   VD25OH 40.7 04/22/2018     Patient Active Problem List   Diagnosis Date Noted   Former smoker 10/10/2020   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) 05/03/2020   Abdominal aortic atherosclerosis (HCC) 04/11/2020   Acquired deviated nasal septum 09/23/2019   BPH with obstruction/lower urinary tract symptoms 04/22/2018   DISH (diffuse idiopathic skeletal hyperostosis) 11/14/2016   Calcified granuloma of lung 02/06/2016   Unilateral inguinal hernia without obstruction or gangrene 05/22/2015   GERD (gastroesophageal reflux disease) 11/15/2014   Mixed hyperlipidemia 11/15/2014   COPD mixed type (HCC) 11/15/2014   Osteoporosis without current pathological fracture 11/15/2014   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) 11/15/2014    Allergies  Allergen Reactions   Pravastatin     Other reaction(s): Nausea Only   Simvastatin     Other reaction(s): Nausea Only    Past Surgical History:  Procedure Laterality Date   BASAL CELL CARCINOMA EXCISION     COLONOSCOPY  06/06/2010   COLONOSCOPY WITH PROPOFOL N/A 06/08/2020   Procedure: COLONOSCOPY WITH PROPOFOL;  Surgeon: Midge Minium, MD;  Location: [facility];  Service: Endoscopy;  Laterality: N/A;  priority 4   ETHMOIDECTOMY Bilateral 07/03/2020   Procedure: ETHMOIDECTOMY;  Surgeon: Geanie Logan, MD;  Location: [facility];  Service: ENT;  Laterality: Bilateral;   FRONTAL SINUS EXPLORATION Bilateral 07/03/2020   Procedure: FRONTAL SINUS EXPLORATION;  Surgeon: Geanie Logan, MD;  Location: [facility];  Service: ENT;  Laterality: Bilateral;   HERNIA REPAIR     HOLEP-LASER ENUCLEATION OF THE PROSTATE WITH MORCELLATION N/A 09/23/2021   Procedure: HOLEP-LASER ENUCLEATION OF THE PROSTATE WITH MORCELLATION;  Surgeon: Vanna Scotland, MD;  Location: [facility] ORS;  Service:  Urology;  Laterality: N/A;   IMAGE GUIDED SINUS SURGERY N/A 07/03/2020   Procedure: IMAGE GUIDED SINUS SURGERY;  Surgeon: Geanie Logan, MD;  Location: [facility];  Service: ENT;  Laterality: N/A;  need stryker disk PLACE DISK ON OR CHARGE NURSE DESK 10-01 KP Another disk on OR charge nurse desk 10.14.21 DS   MAXILLARY ANTROSTOMY Bilateral 07/03/2020   Procedure: MAXILLARY ANTROSTOMY with tissue removal;  Surgeon: Geanie Logan, MD;  Location: [facility];  Service: ENT;  Laterality: Bilateral;   NASAL SEPTOPLASTY W/ TURBINOPLASTY Bilateral 07/03/2020   Procedure: NASAL SEPTOPLASTY;  Surgeon: Geanie Logan, MD;  Location: [facility];  Service: ENT;  Laterality: Bilateral;   SPHENOIDECTOMY Bilateral 07/03/2020   Procedure: Selina Cooley;  Surgeon: Geanie Logan, MD;  Location: [facility];  Service: ENT;  Laterality: Bilateral;   TRANSURETHRAL RESECTION OF BLADDER TUMOR WITH MITOMYCIN-C N/A 06/21/2018   Procedure: TRANSURETHRAL RESECTION OF BLADDER TUMOR WITH Gemcitabine;  Surgeon: Vanna Scotland, MD;  Location: [facility] ORS;  Service: Urology;  Laterality: N/A;   VARICOCELECTOMY     VARICOCELECTOMY      Social History   Tobacco Use   Smoking status: Former    Current packs/day: 0.00    Average packs/day: 0.5 packs/day for 10.0 years (5.0 ttl pk-yrs)    Types: Pipe, Cigarettes  Start date: 02/13/1974    Quit date: 2019    Years since quitting: 5.8   Smokeless tobacco: Never  Vaping Use   Vaping status: Never Used  Substance Use Topics   Alcohol use: Yes    Comment: occasionally   Drug use: No     Medication list has been reviewed and updated.  Current Meds  Medication Sig   Adalimumab 40 MG/0.8ML PNKT Inject 40 mg into the skin every 14 (fourteen) days.   albuterol (PROAIR HFA) 108 (90 Base) MCG/ACT inhaler USE 1 INHALATION EVERY 6 HOURS AS NEEDED FOR WHEEZING OR SHORTNESS OF BREATH (NEEDS APPOINTMENT FOR FURTHER REFILLS)   calcium-vitamin D  250-100 MG-UNIT tablet Take 1 tablet by mouth daily.   folic acid (FOLVITE) 1 MG tablet Take 1 mg by mouth daily.   ketotifen (ZADITOR) 0.025 % ophthalmic solution Place 1 drop into both eyes once a week.   loratadine (CLARITIN) 10 MG tablet Take 10 mg by mouth daily.   meloxicam (MOBIC) 15 MG tablet Take 15 mg by mouth daily.   methotrexate (RHEUMATREX) 2.5 MG tablet SMARTSIG:7 Tablet(s) By Mouth Once a Week   montelukast (SINGULAIR) 10 MG tablet Take 1 tablet (10 mg total) by mouth daily.   Multiple Vitamin (MULTIVITAMIN) capsule Take 1 capsule by mouth daily.   omeprazole (PRILOSEC) 20 MG capsule TAKE 1 CAPSULE DAILY   rosuvastatin (CRESTOR) 20 MG tablet TAKE 1 TABLET DAILY (NEED APPOINTMENT FOR FURTHER REFILLS)   triamcinolone (NASACORT) 55 MCG/ACT AERO nasal inhaler Place 2 sprays into the nose daily.   Zoster Vaccine Adjuvanted [facility]) injection Inject 0.5 mLs into the muscle once for 1 dose.   [DISCONTINUED] denosumab (PROLIA) 60 MG/ML SOSY injection        07/01/2023    8:31 AM 06/25/2022    1:22 PM 05/10/2021    8:35 AM  GAD 7 : Generalized Anxiety Score  Nervous, Anxious, on Edge 0 0 0  Control/stop worrying 0 0 0  Worry too much - different things 0 0 0  Trouble relaxing 0 0 0  Restless 0 0 0  Easily annoyed or irritable 0 0 0  Afraid - awful might happen 0 0 0  Total GAD 7 Score 0 0 0  Anxiety Difficulty Not difficult at all Not difficult at all Not difficult at all       07/01/2023    8:31 AM 12/24/2022   11:03 AM 06/25/2022    1:22 PM  Depression screen PHQ 2/9  Decreased Interest 0 0 0  Down, Depressed, Hopeless 0 0 0  PHQ - 2 Score 0 0 0  Altered sleeping 0 0 0  Tired, decreased energy 0 0 0  Change in appetite 0 0 0  Feeling bad or failure about yourself  0 0 0  Trouble concentrating 0 0 0  Moving slowly or fidgety/restless 3 0 0  Suicidal thoughts 0 0 0  PHQ-9 Score 3 0 0  Difficult doing work/chores Not difficult at all Not difficult at all Not  difficult at all    BP Readings from Last 3 Encounters:  07/01/23 118/74  05/13/23 (!) 171/70  03/13/23 130/86    Physical Exam Vitals and nursing note reviewed.  Constitutional:      Appearance: Normal appearance. He is well-developed.  HENT:     Head: Normocephalic.     Right Ear: Tympanic membrane, ear canal and external ear normal.     Left Ear: Tympanic membrane, ear canal and external ear  normal.     Nose: Nose normal.  Eyes:     Conjunctiva/sclera: Conjunctivae normal.     Pupils: Pupils are equal, round, and reactive to light.  Neck:     Thyroid: No thyromegaly.     Vascular: No carotid bruit.  Cardiovascular:     Rate and Rhythm: Normal rate and regular rhythm.     Heart sounds: Normal heart sounds.  Pulmonary:     Effort: Pulmonary effort is normal.     Breath sounds: Normal breath sounds. No wheezing.  Chest:  Breasts:    Right: No mass.     Left: No mass.  Abdominal:     General: Bowel sounds are normal.     Palpations: Abdomen is soft.     Tenderness: There is no abdominal tenderness.  Musculoskeletal:        General: Normal range of motion.     Cervical back: Normal range of motion and neck supple.  Lymphadenopathy:     Cervical: No cervical adenopathy.  Skin:    General: Skin is warm and dry.  Neurological:     Mental Status: He is alert and oriented to person, place, and time.     Deep Tendon Reflexes: Reflexes are normal and symmetric.  Psychiatric:        Attention and Perception: Attention normal.        Mood and Affect: Mood normal.        Thought Content: Thought content normal.     Wt Readings from Last 3 Encounters:  07/01/23 187 lb (84.8 kg)  05/13/23 189 lb 4.8 oz (85.9 kg)  03/13/23 187 lb (84.8 kg)    BP 118/74   Pulse 62   Ht 5\' 10"  (1.778 m)   Wt 187 lb (84.8 kg)   SpO2 98%   BMI 26.83 kg/m   Assessment and Plan:  Problem List Items Addressed This Visit       Unprioritized   Abdominal aortic atherosclerosis (HCC)  (Chronic)   Relevant Orders   Lipid panel   GERD (gastroesophageal reflux disease) - Primary (Chronic)    Reflux symptoms are minimal on current therapy - omeprazole. 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      Mixed hyperlipidemia    LDL is  Lab Results  Component Value Date   LDLCALC 77 06/25/2022  Currently being treated with Crestor with good compliance and no concerns.       Relevant Orders   Lipid panel   Osteoporosis without current pathological fracture (Chronic)    Treated with Prolia in the past as well as Fosamax and followed by Endo Continues on calcium and vitamin D. Will get DEXA next year to assess.      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    Reubin Milan, MD [facility] [place]

## 2023-07-01 NOTE — Assessment & Plan Note (Signed)
Followed by Urology - recent cysto without evidence of recurrence.

## 2023-07-01 NOTE — Assessment & Plan Note (Signed)
Followed by Rheumatology On Humira and MTX

## 2023-07-01 NOTE — Assessment & Plan Note (Signed)
Reflux symptoms are minimal on current therapy - omeprazole. No red flag signs such as weight loss, n/v, melena  

## 2023-07-02 LAB — LIPID PANEL
Chol/HDL Ratio: 4.2 ratio (ref 0.0–5.0)
Cholesterol, Total: 135 mg/dL (ref 100–199)
HDL: 32 mg/dL — ABNORMAL LOW (ref >39)
LDL Chol Calc (NIH): 75 mg/dL (ref 0–99)
Triglycerides: 164 mg/dL — ABNORMAL HIGH (ref 0–149)
VLDL Cholesterol Cal: 28 mg/dL (ref 5–40)

## 2023-07-02 LAB — COMPREHENSIVE METABOLIC PANEL
ALT: 15 [IU]/L (ref 0–44)
AST: 21 [IU]/L (ref 0–40)
Albumin: 3.9 g/dL (ref 3.9–4.9)
Alkaline Phosphatase: 108 [IU]/L (ref 44–121)
BUN/Creatinine Ratio: 20 (ref 10–24)
BUN: 17 mg/dL (ref 8–27)
Bilirubin Total: 0.4 mg/dL (ref 0.0–1.2)
CO2: 23 mmol/L (ref 20–29)
Calcium: 9.2 mg/dL (ref 8.6–10.2)
Chloride: 105 mmol/L (ref 96–106)
Creatinine, Ser: 0.87 mg/dL (ref 0.76–1.27)
Globulin, Total: 3 g/dL (ref 1.5–4.5)
Glucose: 92 mg/dL (ref 70–99)
Potassium: 4.3 mmol/L (ref 3.5–5.2)
Sodium: 142 mmol/L (ref 134–144)
Total Protein: 6.9 g/dL (ref 6.0–8.5)
eGFR: 95 mL/min/{1.73_m2} (ref >59)

## 2023-07-02 LAB — VITAMIN D 25 HYDROXY (VIT D DEFICIENCY, FRACTURES): Vit D, 25-Hydroxy: 46.6 ng/mL (ref 30.0–100.0)

## 2023-07-08 ENCOUNTER — Ambulatory Visit: Payer: Medicare Other | Admitting: Dermatology

## 2023-07-08 DIAGNOSIS — L570 Actinic keratosis: Secondary | ICD-10-CM | POA: Diagnosis not present

## 2023-07-08 DIAGNOSIS — D1801 Hemangioma of skin and subcutaneous tissue: Secondary | ICD-10-CM

## 2023-07-08 DIAGNOSIS — L814 Other melanin hyperpigmentation: Secondary | ICD-10-CM | POA: Diagnosis not present

## 2023-07-08 DIAGNOSIS — D229 Melanocytic nevi, unspecified: Secondary | ICD-10-CM

## 2023-07-08 DIAGNOSIS — Z5111 Encounter for antineoplastic chemotherapy: Secondary | ICD-10-CM

## 2023-07-08 DIAGNOSIS — Z1283 Encounter for screening for malignant neoplasm of skin: Secondary | ICD-10-CM

## 2023-07-08 DIAGNOSIS — Z79899 Other long term (current) drug therapy: Secondary | ICD-10-CM

## 2023-07-08 DIAGNOSIS — Z7189 Other specified counseling: Secondary | ICD-10-CM

## 2023-07-08 DIAGNOSIS — W908XXA Exposure to other nonionizing radiation, initial encounter: Secondary | ICD-10-CM

## 2023-07-08 DIAGNOSIS — L578 Other skin changes due to chronic exposure to nonionizing radiation: Secondary | ICD-10-CM | POA: Diagnosis not present

## 2023-07-08 DIAGNOSIS — L821 Other seborrheic keratosis: Secondary | ICD-10-CM

## 2023-07-08 MED ORDER — FLUOROURACIL 5 % EX CREA: TOPICAL_CREAM | CUTANEOUS | 0 refills | Status: DC

## 2023-07-08 NOTE — Progress Notes (Signed)
Follow-Up Visit   Subjective  Nathan Anthony is a 67 y.o. male who presents for the following: Skin Cancer Screening and Full Body Skin Exam  The patient presents for Total-Body Skin Exam (TBSE) for skin cancer screening and mole check. The patient has spots, moles and lesions to be evaluated, some may be new or changing and the patient may have concern these could be cancer.  The following portions of the chart were reviewed this encounter and updated as appropriate: medications, allergies, medical history  Review of Systems:  No other skin or systemic complaints except as noted in HPI or Assessment and Plan.  Objective  Well appearing patient in no apparent distress; mood and affect are within normal limits.  A full examination was performed including scalp, head, eyes, ears, nose, lips, neck, chest, axillae, abdomen, back, buttocks, bilateral upper extremities, bilateral lower extremities, hands, feet, fingers, toes, fingernails, and toenails. All findings within normal limits unless otherwise noted below.   Relevant physical exam findings are noted in the Assessment and Plan.  Scalp/forehead x 8 (8) Erythematous thin papules/macules with gritty scale.     Assessment & Plan   SKIN CANCER SCREENING PERFORMED TODAY.  ACTINIC DAMAGE WITH PRECANCEROUS ACTINIC KERATOSES Counseling for Topical Chemotherapy Management: Patient exhibits: - Severe, confluent actinic changes with pre-cancerous actinic keratoses that is secondary to cumulative UV radiation exposure over time - Condition that is severe; chronic, not at goal. - diffuse scaly erythematous macules and papules with underlying dyspigmentation - Discussed Prescription "Field Treatment" topical Chemotherapy for Severe, Chronic Confluent Actinic Changes with Pre-Cancerous Actinic Keratoses Field treatment involves treatment of an entire area of skin that has confluent Actinic Changes (Sun/ Ultraviolet light damage) and  PreCancerous Actinic Keratoses by method of PhotoDynamic Therapy (PDT) and/or prescription Topical Chemotherapy agents such as 5-fluorouracil, 5-fluorouracil/calcipotriene, and/or imiquimod.  The purpose is to decrease the number of clinically evident and subclinical PreCancerous lesions to prevent progression to development of skin cancer by chemically destroying early precancer changes that may or may not be visible.  It has been shown to reduce the risk of developing skin cancer in the treated area. As a result of treatment, redness, scaling, crusting, and open sores may occur during treatment course. One or more than one of these methods may be used and may have to be used several times to control, suppress and eliminate the PreCancerous changes. Discussed treatment course, expected reaction, and possible side effects. - Recommend daily broad spectrum sunscreen SPF 30+ to sun-exposed areas, reapply every 2 hours as needed.  - Staying in the shade or wearing long sleeves, sun glasses (UVA+UVB protection) and wide brim hats (4-inch brim around the entire circumference of the hat) are also recommended. - Call for new or changing lesions. - In January start 5FU/Calcipotriene mix BID x 7 days to the forehead and scalp.   LENTIGINES, SEBORRHEIC KERATOSES, HEMANGIOMAS - Benign normal skin lesions - Benign-appearing - Call for any changes  MELANOCYTIC NEVI - Tan-brown and/or pink-flesh-colored symmetric macules and papules - Benign appearing on exam today - Observation - Call clinic for new or changing moles - Recommend daily use of broad spectrum spf 30+ sunscreen to sun-exposed areas.   AK (actinic keratosis) (8) Scalp/forehead x 8  Actinic Damage - Severe, confluent actinic changes with pre-cancerous actinic keratoses  - Severe, chronic, not at goal, secondary to cumulative UV radiation exposure over time - diffuse scaly erythematous macules and papules with underlying dyspigmentation -  Discussed Prescription "Field Treatment" for  Severe, Chronic Confluent Actinic Changes with Pre-Cancerous Actinic Keratoses Field treatment involves treatment of an entire area of skin that has confluent Actinic Changes (Sun/ Ultraviolet light damage) and PreCancerous Actinic Keratoses by method of PhotoDynamic Therapy (PDT) and/or prescription Topical Chemotherapy agents such as 5-fluorouracil, 5-fluorouracil/calcipotriene, and/or imiquimod.  The purpose is to decrease the number of clinically evident and subclinical PreCancerous lesions to prevent progression to development of skin cancer by chemically destroying early precancer changes that may or may not be visible.  It has been shown to reduce the risk of developing skin cancer in the treated area. As a result of treatment, redness, scaling, crusting, and open sores may occur during treatment course. One or more than one of these methods may be used and may have to be used several times to control, suppress and eliminate the PreCancerous changes. Discussed treatment course, expected reaction, and possible side effects. - Recommend daily broad spectrum sunscreen SPF 30+ to sun-exposed areas, reapply every 2 hours as needed.  - Staying in the shade or wearing long sleeves, sun glasses (UVA+UVB protection) and wide brim hats (4-inch brim around the entire circumference of the hat) are also recommended. - Call for new or changing lesions.  Destruction of lesion - Scalp/forehead x 8 (8) Complexity: simple   Destruction method: cryotherapy   Informed consent: discussed and consent obtained   Timeout:  patient name, date of birth, surgical site, and procedure verified Lesion destroyed using liquid nitrogen: Yes   Region frozen until ice ball extended beyond lesion: Yes   Outcome: patient tolerated procedure well with no complications   Post-procedure details: wound care instructions given     Return for AK follow up in 6-8 mths.  Maylene Roes,  CMA, am acting as scribe for Armida Sans, MD .   Documentation: I have reviewed the above documentation for accuracy and completeness, and I agree with the above.  Armida Sans, MD

## 2023-07-08 NOTE — Patient Instructions (Addendum)
Instructions for [facility] Medications  One or more of your medications was sent to the [facility] mail order compounding pharmacy. You will receive an email from them and can purchase the medicine through that link. It will then be mailed to your home at the address you confirmed. If for any reason you do not receive an email from them, please check your spam folder. If you still do not find the email, please let us know. [facility] phone number is [phone].       Due to recent changes in healthcare laws, you may see results of your pathology and/or laboratory studies on MyChart before the doctors have had a chance to review them. We understand that in some cases there may be results that are confusing or concerning to you. Please understand that not all results are received at the same time and often the doctors may need to interpret multiple results in order to provide you with the best plan of care or course of treatment. Therefore, we ask that you please give [place] 2 business days to thoroughly review all your results before contacting the office for clarification. Should we see a critical lab result, you will be contacted sooner.   If You Need Anything After Your Visit  If you have any questions or concerns for your doctor, please call our main line at [phone] and press option 4 to reach your doctor's medical assistant. If no one answers, please leave a voicemail as directed and we will return your call as soon as possible. Messages left after 4 pm will be answered the following business day.   You may also send [place] a message via MyChart. We typically respond to MyChart messages within 1-2 business days.  For prescription refills, please ask your pharmacy to contact our office. Our fax number is [phone].  If you have an urgent issue when the clinic is closed that cannot wait until the next business day, you can page your doctor at the number below.    Please note  that while we do our best to be available for urgent issues outside of office hours, we are not available 24/7.   If you have an urgent issue and are unable to reach [place], you may choose to seek medical care at your doctor's office, retail clinic, urgent care center, or emergency room.  If you have a medical emergency, please immediately call 911 or go to the emergency department.  Pager Numbers  - Dr. Gwen Pounds: [phone]  - Dr. Roseanne Reno: [phone]  - Dr. Katrinka Blazing: [phone]   In the event of inclement weather, please call our main line at [phone] for an update on the status of any delays or closures.  Dermatology Medication Tips: Please keep the boxes that topical medications come in in order to help keep track of the instructions about where and how to use these. Pharmacies typically print the medication instructions only on the boxes and not directly on the medication tubes.   If your medication is too expensive, please contact our office at [phone] option 4 or send [place] a message through MyChart.   We are unable to tell what your co-pay for medications will be in advance as this is different depending on your insurance coverage. However, we may be able to find a substitute medication at lower cost or fill out paperwork to get insurance to cover a needed medication.   If a prior authorization is required to get your medication covered by your  insurance company, please allow [place] 1-2 business days to complete this process.  Drug prices often vary depending on where the prescription is filled and some pharmacies may offer cheaper prices.  The website www.goodrx.com contains coupons for medications through different pharmacies. The prices here do not account for what the cost may be with help from insurance (it may be cheaper with your insurance), but the website can give you the price if you did not use any insurance.  - You can print the associated coupon and take it with your  prescription to the pharmacy.  - You may also stop by our office during regular business hours and pick up a GoodRx coupon card.  - If you need your prescription sent electronically to a different pharmacy, notify our office through [facility] or by phone at [phone] option 4.     Si Usted Necesita Algo Despus de Su Visita  Tambin puede enviarnos un mensaje a travs de Clinical cytogeneticist. Por lo general respondemos a los mensajes de MyChart en el transcurso de 1 a 2 das hbiles.  Para renovar recetas, por favor pida a su farmacia que se ponga en contacto con nuestra oficina. Annie Sable de fax es [place] [phone].  Si tiene un asunto urgente cuando la clnica est cerrada y que no puede esperar hasta el siguiente da hbil, puede llamar/localizar a su doctor(a) al nmero que aparece a continuacin.   Por favor, tenga en cuenta que aunque hacemos todo lo posible para estar disponibles para asuntos urgentes fuera del horario de [place], no estamos disponibles las 24 horas del da, los 7 [address]  [address] de la [place].   Si tiene un problema urgente y no puede comunicarse con nosotros, puede optar por buscar atencin mdica  en el consultorio de su doctor(a), en una clnica privada, en un centro de atencin urgente o en una sala de emergencias.  Si tiene Engineer, drilling, por favor llame inmediatamente al 911 o vaya a la sala de emergencias.  Nmeros de bper  - Dr. Gwen Pounds: [phone]  - Dra. Roseanne Reno: [phone]  - Dr. Katrinka Blazing: [phone]   En caso de inclemencias del tiempo, por favor llame a Lacy Duverney principal al [phone] para una actualizacin sobre el [place] de cualquier retraso o cierre.  Consejos para la medicacin en dermatologa: Por favor, guarde las cajas en las que vienen los medicamentos de uso tpico para ayudarle a seguir las instrucciones sobre dnde y cmo usarlos. Las farmacias generalmente imprimen las instrucciones del medicamento slo en las cajas y no  directamente en los tubos del [place].   Si su medicamento es muy caro, por favor, pngase en contacto con Rolm Gala llamando al [phone] y presione la opcin 4 o envenos un mensaje a travs de Clinical cytogeneticist.   No podemos decirle cul ser su copago por los medicamentos por adelantado ya que esto es diferente dependiendo de la cobertura de su seguro. Sin embargo, es posible que podamos encontrar un medicamento sustituto a Audiological scientist un formulario para que el seguro cubra el medicamento que se considera necesario.   Si se requiere una autorizacin previa para que su compaa de seguros [place] su medicamento, por favor permtanos de 1 a 2 das hbiles para completar [address]  Los precios de los medicamentos varan con frecuencia dependiendo del Environmental consultant de dnde se surte la receta y alguna farmacias pueden ofrecer precios ms baratos.  El sitio web www.goodrx.com tiene cupones para medicamentos de Health and safety inspector. Los precios aqu no tienen  en cuenta lo que podra costar con la ayuda del seguro (puede ser ms barato con su seguro), pero el sitio web puede darle el precio si no Visual merchandiser.  - Puede imprimir el cupn correspondiente y llevarlo con su receta a la farmacia.  - Tambin puede pasar por nuestra oficina durante el horario de atencin regular y Education officer, museum una tarjeta de cupones de GoodRx.  - Si necesita que su receta se enve electrnicamente a una farmacia diferente, informe a nuestra oficina a travs de MyChart de [place] o por telfono llamando al [phone] y presione la opcin 4.

## 2023-07-14 ENCOUNTER — Encounter: Payer: Self-pay | Admitting: Dermatology

## 2023-07-27 ENCOUNTER — Other Ambulatory Visit: Payer: Self-pay | Admitting: Primary Care

## 2023-07-27 DIAGNOSIS — J4530 Mild persistent asthma, uncomplicated: Secondary | ICD-10-CM

## 2023-07-27 DIAGNOSIS — J4520 Mild intermittent asthma, uncomplicated: Secondary | ICD-10-CM

## 2023-07-27 MED ORDER — ALBUTEROL SULFATE HFA 108 (90 BASE) MCG/ACT IN AERS
INHALATION_SPRAY | RESPIRATORY_TRACT | 3 refills | Status: AC
Start: 2023-07-27 — End: ?

## 2023-08-19 ENCOUNTER — Other Ambulatory Visit: Payer: Self-pay | Admitting: Internal Medicine

## 2023-08-19 DIAGNOSIS — K219 Gastro-esophageal reflux disease without esophagitis: Secondary | ICD-10-CM

## 2023-08-19 NOTE — Telephone Encounter (Signed)
Requested Prescriptions  Pending Prescriptions Disp Refills   omeprazole (PRILOSEC) 20 MG capsule [Pharmacy Med Name: OMEPRAZOLE DR CAPS 20MG ] 90 capsule 3    Sig: TAKE 1 CAPSULE DAILY     Gastroenterology: Proton Pump Inhibitors Passed - 08/19/2023  2:10 PM      Passed - Valid encounter within last 12 months    Recent Outpatient Visits           1 month ago Gastroesophageal reflux disease, unspecified whether esophagitis present   [facility] at [facility], Nyoka Cowden, MD   1 year ago Abdominal aortic atherosclerosis [facility])   [place] [facility] at [facility], Nyoka Cowden, MD   2 years ago Annual physical exam   [facility] at [facility], Nyoka Cowden, MD   3 years ago Annual physical exam   [facility] at [facility], Nyoka Cowden, MD   3 years ago Cellulitis of left lower extremity   [place] [facility] at [facility], Nyoka Cowden, MD       Future Appointments             In 6 months Deirdre Evener, MD [facility]   In 10 months Judithann Graves, Nyoka Cowden, MD [facility] Care [facility] Sports Medicine at [facility], [facility]

## 2023-09-23 ENCOUNTER — Telehealth: Payer: Self-pay | Admitting: *Deleted

## 2023-09-23 NOTE — Telephone Encounter (Signed)
[facility]: calling to verify allergy list and patient can take rosuvastatin. Per chart- patient has been taking for many years- will send message to office for confirmation. Please call them back.  [id number]#[id number]  Contact: [place]- [facility]- [phone]

## 2023-09-23 NOTE — Telephone Encounter (Signed)
Called and gave verbal okay to continue to take.

## 2023-12-30 ENCOUNTER — Ambulatory Visit: Payer: Medicare Other | Admitting: Emergency Medicine

## 2023-12-30 VITALS — Ht 70.0 in | Wt 180.0 lb

## 2023-12-30 DIAGNOSIS — Z0000 Encounter for general adult medical examination without abnormal findings: Secondary | ICD-10-CM

## 2023-12-30 NOTE — Patient Instructions (Addendum)
 [name] , Thank you for taking time to come for your Medicare Wellness Visit. I appreciate your ongoing commitment to your health goals. Please review the following plan we discussed and let me know if I can assist you in the future.   Referrals/Orders/Follow-Ups/Clinician Recommendations: Keep up the good work!  This is a list of the screening recommended for you and due dates:  Health Maintenance  Topic Date Due   Zoster (Shingles) Vaccine (1 of 2) Never done   COVID-19 Vaccine (3 - 2024-25 season) 05/03/2023   Flu Shot  04/01/2024   DTaP/Tdap/Td vaccine (2 - Td or Tdap) 12/11/2024   Medicare Annual Wellness Visit  12/29/2024   Colon Cancer Screening  06/08/2030   Pneumonia Vaccine  Completed   Hepatitis C Screening  Completed   HPV Vaccine  Aged Out   Meningitis B Vaccine  Aged Out    Advanced directives: (Declined) Advance directive discussed with you today. Even though you declined this today, please call our office should you change your mind, and we can give you the proper paperwork for you to fill out.  Next Medicare Annual Wellness Visit scheduled for next year: Yes, 01/04/25 @ 10:50am (phone visit)  Fall Prevention in the Home, Adult Falls can cause injuries and affect people of all ages. There are many simple things that you can do to make your home safe and to help prevent falls. If you need it, ask for help making these changes. What actions can I take to prevent falls? General information Use good lighting in all rooms. Make sure to: Replace any light bulbs that burn out. Turn on lights if it is dark and use night-lights. Keep items that you use often in easy-to-reach places. Lower the shelves around your home if needed. Move furniture so that there are clear paths around it. Do not keep throw rugs or other things on the floor that can make you trip. If any of your floors are uneven, fix them. Add color or contrast paint or tape to clearly mark and help you  see: Grab bars or handrails. First and last steps of staircases. Where the edge of each step is. If you use a ladder or stepladder: Make sure that it is fully opened. Do not climb a closed ladder. Make sure the sides of the ladder are locked in place. Have someone hold the ladder while you use it. Know where your pets are as you move through your home. What can I do in the bathroom?     Keep the floor dry. Clean up any water  that is on the floor right away. Remove soap buildup in the bathtub or shower. Buildup makes bathtubs and showers slippery. Use non-skid mats or decals on the floor of the bathtub or shower. Attach bath mats securely with double-sided, non-slip rug tape. If you need to sit down while you are in the shower, use a non-slip stool. Install grab bars by the toilet and in the bathtub and shower. Do not use towel bars as grab bars. What can I do in the bedroom? Make sure that you have a light by your bed that is easy to reach. Do not use any sheets or blankets on your bed that hang to the floor. Have a firm bench or chair with side arms that you can use for support when you get dressed. What can I do in the kitchen? Clean up any spills right away. If you need to reach something above you, use  a sturdy step stool that has a grab bar. Keep electrical cables out of the way. Do not use floor polish or wax that makes floors slippery. What can I do with my stairs? Do not leave anything on the stairs. Make sure that you have a light switch at the top and the bottom of the stairs. Have them installed if you do not have them. Make sure that there are handrails on both sides of the stairs. Fix handrails that are broken or loose. Make sure that handrails are as long as the staircases. Install non-slip stair treads on all stairs in your home if they do not have carpet. Avoid having throw rugs at the top or bottom of stairs, or secure the rugs with carpet tape to prevent them from  moving. Choose a carpet design that does not hide the edge of steps on the stairs. Make sure that carpet is firmly attached to the stairs. Fix any carpet that is loose or worn. What can I do on the outside of my home? Use bright outdoor lighting. Repair the edges of walkways and driveways and fix any cracks. Clear paths of anything that can make you trip, such as tools or rocks. Add color or contrast paint or tape to clearly mark and help you see high doorway thresholds. Trim any bushes or trees on the main path into your home. Check that handrails are securely fastened and in good repair. Both sides of all steps should have handrails. Install guardrails along the edges of any raised decks or porches. Have leaves, snow, and ice cleared regularly. Use sand, salt, or ice melt on walkways during winter months if you live where there is ice and snow. In the garage, clean up any spills right away, including grease or oil spills. What other actions can I take? Review your medicines with your health care provider. Some medicines can make you confused or feel dizzy. This can increase your chance of falling. Wear closed-toe shoes that fit well and support your feet. Wear shoes that have rubber soles and low heels. Use a cane, walker, scooter, or crutches that help you move around if needed. Talk with your provider about other ways that you can decrease your risk of falls. This may include seeing a physical therapist to learn to do exercises to improve movement and strength. Where to find more information Centers for Disease Control and Prevention, STEADI: TonerPromos.no [employer] on Aging: BaseRingTones.pl National Institute on Aging: BaseRingTones.pl Contact a health care provider if: You are afraid of falling at home. You feel weak, drowsy, or dizzy at home. You fall at home. Get help right away if you: Lose consciousness or have trouble moving after a fall. Have a fall that causes a head injury. These  symptoms may be an emergency. Get help right away. Call 911. Do not wait to see if the symptoms will go away. Do not drive yourself to the hospital. This information is not intended to replace advice given to you by your health care provider. Make sure you discuss any questions you have with your health care provider. Document Revised: 04/21/2022 Document Reviewed: 04/21/2022 Elsevier Patient Education  2024 [employer].

## 2023-12-30 NOTE — Progress Notes (Signed)
 Subjective:   Nathan Anthony is a 68 y.o. who presents for a Medicare Wellness preventive visit.  Visit Complete: Virtual I connected with  Nathan Anthony on 12/30/23 by a audio enabled telemedicine application and verified that I am speaking with the correct person using two identifiers.  Patient Location: Other:  [employer] parking lot  Provider Location: Home Office  I discussed the limitations of evaluation and management by telemedicine. The patient expressed understanding and agreed to proceed.  Vital Signs: Because this visit was a virtual/telehealth visit, some criteria may be missing or patient reported. Any vitals not documented were not able to be obtained and vitals that have been documented are patient reported.  VideoDeclined- This patient declined Librarian, academic. Therefore the visit was completed with audio only.  Persons Participating in Visit: Patient.  AWV Questionnaire: Yes: Patient Medicare AWV questionnaire was completed by the patient on 12/23/23; I have confirmed that all information answered by patient is correct and no changes since this date.  Cardiac Risk Factors include: advanced age (>80men, >25 women);male gender;dyslipidemia     Objective:    Today's Vitals   12/30/23 1046  Weight: 180 lb (81.6 kg)  Height: 5\' 10"  (1.778 m)  PainSc: 4    Body mass index is 25.83 kg/m.     12/30/2023   11:08 AM 12/24/2022   11:05 AM 05/15/2022    3:10 PM 09/23/2021    7:06 AM 09/09/2021    8:19 AM 09/19/2020    4:52 AM 07/03/2020    8:09 AM  Advanced Directives  Does Patient Have a Medical Advance Directive? No No No No No No No  Would patient like information on creating a medical advance directive? No - Patient declined No - Patient declined No - Patient declined No - Patient declined  No - Patient declined No - Patient declined    Current Medications (verified) Outpatient Encounter Medications as of 12/30/2023   Medication Sig   Adalimumab 40 MG/0.8ML PNKT Inject 40 mg into the skin every 14 (fourteen) days.   albuterol  (PROAIR  HFA) 108 (90 Base) MCG/ACT inhaler USE 1 INHALATION EVERY 6 HOURS AS NEEDED FOR WHEEZING OR SHORTNESS OF BREATH (NEEDS APPOINTMENT FOR FURTHER REFILLS)   Aspirin-Acetaminophen -Caffeine (GOODYS EXTRA STRENGTH PO) Take 1 packet by mouth daily as needed (headaches).   calcium -vitamin D  250-100 MG-UNIT tablet Take 1 tablet by mouth daily.   folic acid (FOLVITE) 1 MG tablet Take 1 mg by mouth daily.   ketotifen (ZADITOR) 0.025 % ophthalmic solution Place 1 drop into both eyes once a week.   loratadine (CLARITIN) 10 MG tablet Take 10 mg by mouth daily.   meloxicam (MOBIC) 15 MG tablet Take 15 mg by mouth daily.   methotrexate (RHEUMATREX) 2.5 MG tablet Take 2.5 mg by mouth once a week. 8 tablets by mouth once a week   montelukast  (SINGULAIR ) 10 MG tablet TAKE 1 TABLET DAILY   Multiple Vitamin (MULTIVITAMIN) capsule Take 1 capsule by mouth daily.   omeprazole  (PRILOSEC) 20 MG capsule TAKE 1 CAPSULE DAILY   rosuvastatin  (CRESTOR ) 20 MG tablet TAKE 1 TABLET DAILY (NEED APPOINTMENT FOR FURTHER REFILLS)   triamcinolone (NASACORT) 55 MCG/ACT AERO nasal inhaler Place 2 sprays into the nose daily.   fluorouracil  (EFUDEX ) 5 % cream In January apply to the forehead and scalp BID x 7 days (Patient not taking: Reported on 12/30/2023)   methotrexate (RHEUMATREX) 2.5 MG tablet SMARTSIG:7 Tablet(s) By Mouth Once a Week (Patient not  taking: Reported on 12/30/2023)   No facility-administered encounter medications on file as of 12/30/2023.    Allergies (verified) Pravastatin and Simvastatin   History: Past Medical History:  Diagnosis Date   Allergy    Asthma    Calcified granuloma of lung    Cancer (HCC)    bladder   Chronic obstructive pulmonary disease (HCC) 04/22/2018   Complication of anesthesia    Asthmatic episode when awakening from colonoscopy   DISH (diffuse idiopathic skeletal  hyperostosis)    neck pain   GERD (gastroesophageal reflux disease)    Hyperlipidemia    Hyperlipidemia    Inguinal hernia    Left Side   Nasal congestion    nasal polyps   Osteopenia    Osteoporosis    RA (rheumatoid arthritis) (HCC)    shoulders and hands   Rheumatoid arthritis (HCC)    Sciatica    Sciatica    Sciatica    Tinnitus aurium, left    Past Surgical History:  Procedure Laterality Date   BASAL CELL CARCINOMA EXCISION     COLONOSCOPY  06/06/2010   COLONOSCOPY WITH PROPOFOL  N/A 06/08/2020   Procedure: COLONOSCOPY WITH PROPOFOL ;  Surgeon: Marnee Sink, MD;  Location: [facility];  Service: Endoscopy;  Laterality: N/A;  priority 4   ETHMOIDECTOMY Bilateral 07/03/2020   Procedure: ETHMOIDECTOMY;  Surgeon: Von Grumbling, MD;  Location: [facility];  Service: ENT;  Laterality: Bilateral;   FRONTAL SINUS EXPLORATION Bilateral 07/03/2020   Procedure: FRONTAL SINUS EXPLORATION;  Surgeon: Von Grumbling, MD;  Location: [facility];  Service: ENT;  Laterality: Bilateral;   HERNIA REPAIR     HOLEP-LASER ENUCLEATION OF THE PROSTATE WITH MORCELLATION N/A 09/23/2021   Procedure: HOLEP-LASER ENUCLEATION OF THE PROSTATE WITH MORCELLATION;  Surgeon: Dustin Gimenez, MD;  Location: [facility];  Service: Urology;  Laterality: N/A;   IMAGE GUIDED SINUS SURGERY N/A 07/03/2020   Procedure: IMAGE GUIDED SINUS SURGERY;  Surgeon: Von Grumbling, MD;  Location: [facility];  Service: ENT;  Laterality: N/A;  need [employer]ry[employer] disk PLACE DISK ON OR CHARGE NURSE DESK 10-01 KP Another disk on OR charge nurse desk 10.14.21 DS   MAXILLARY ANTROSTOMY Bilateral 07/03/2020   Procedure: MAXILLARY ANTROSTOMY with tissue removal;  Surgeon: Von Grumbling, MD;  Location: [facility];  Service: ENT;  Laterality: Bilateral;   NASAL SEPTOPLASTY W/ TURBINOPLASTY Bilateral 07/03/2020   Procedure: NASAL SEPTOPLASTY;  Surgeon: Von Grumbling, MD;  Location: [facility];  Service:  ENT;  Laterality: Bilateral;   SPHENOIDECTOMY Bilateral 07/03/2020   Procedure: Rupert Counts;  Surgeon: Von Grumbling, MD;  Location: [facility];  Service: ENT;  Laterality: Bilateral;   TRANSURETHRAL RESECTION OF BLADDER TUMOR WITH MITOMYCIN -C N/A 06/21/2018   Procedure: TRANSURETHRAL RESECTION OF BLADDER TUMOR WITH Gemcitabine ;  Surgeon: Dustin Gimenez, MD;  Location: [facility];  Service: Urology;  Laterality: N/A;   VARICOCELECTOMY     VARICOCELECTOMY     Family History  Problem Relation Age of Onset   Hypertension Mother    Heart disease Mother    Asthma Mother    Kidney failure Mother    Other Father        paralysis   Heart disease Brother    Ankylosing spondylitis Brother    Social History   Socioeconomic History   Marital status: Divorced    Spouse name: Not on file   Number of children: 2   Years of education: Not on file   Highest education level: Associate degree: occupational,  technical, or vocational program  Occupational History   Not on file  Tobacco Use   Smoking status: Former    Current packs/day: 0.00    Average packs/day: 0.5 packs/day for 8.0 years (4.0 ttl pk-yrs)    Types: Pipe, Cigars, Cigarettes    Start date: 02/13/1974    Quit date: 2019    Years since quitting: 6.3    Passive exposure: Past   Smokeless tobacco: Never  Vaping Use   Vaping status: Never Used  Substance and Sexual Activity   Alcohol use: Yes    Comment: 2 beers or Mead (wine made from honey) 1-2 times per month   Drug use: No   Sexual activity: Yes    Birth control/protection: None  Other Topics Concern   Not on file  Social History Narrative   1 biological child and 1 adopted child   Social Drivers of Corporate investment banker Strain: Low Risk  (12/30/2023)   Overall Financial Resource Strain (CARDIA)    Difficulty of Paying Living Expenses: Not hard at all  Food Insecurity: No Food Insecurity (12/30/2023)   Hunger Vital Sign    Worried About Running Out of  Food in the Last Year: Never true    Ran Out of Food in the Last Year: Never true  Transportation Needs: No Transportation Needs (12/30/2023)   PRAPARE - Administrator, Civil Service (Medical): No    Lack of Transportation (Non-Medical): No  Physical Activity: Insufficiently Active (12/30/2023)   Exercise Vital Sign    Days of Exercise per Week: 4 days    Minutes of Exercise per Session: 20 min  Stress: No Stress Concern Present (12/30/2023)   [employer] of Occupational Health - Occupational Stress Questionnaire    Feeling of Stress : Not at all  Social Connections: Unknown (12/30/2023)   Social Connection and Isolation Panel [NHANES]    Frequency of Communication with Friends and Family: More than three times a week    Frequency of Social Gatherings with Friends and Family: More than three times a week    Attends Religious Services: Patient declined    Database administrator or Organizations: No    Attends Engineer, structural: Never    Marital Status: Divorced    Tobacco Counseling Counseling given: Not Answered    Clinical Intake:  Pre-visit preparation completed: Yes  Pain : 0-10 Pain Score: 4  Pain Type: Chronic pain Pain Location: Neck Pain Descriptors / Indicators: Aching     BMI - recorded: 25.83 Nutritional Status: BMI 25 -29 Overweight Nutritional Risks: None Diabetes: No  No results found for: "HGBA1C"   How often do you need to have someone help you when you read instructions, pamphlets, or other written materials from your doctor or pharmacy?: 1 - Never  Interpreter Needed?: No  Information entered by :: Jaunita Messier, CMA   Activities of Daily Living     12/30/2023   10:49 AM 12/23/2023   11:56 AM  In your present state of health, do you have any difficulty performing the following activities:  Hearing? 0 0  Vision? 0 0  Difficulty concentrating or making decisions? 0 0  Walking or climbing stairs? 0 0  Dressing or  bathing? 0 0  Doing errands, shopping? 0 0  Preparing Food and eating ? N N  Using the Toilet? N N  In the past six months, have you accidently leaked urine? Y Y  Comment hx of bladder cancer  and prostate surgery, wears depends guard   Do you have problems with loss of bowel control? N N  Managing your Medications? N N  Managing your Finances? N N  Housekeeping or managing your Housekeeping? N N    Patient Care Team: Sheron Dixons, MD as PCP - General (Internal Medicine) Berton Brock, MD as Consulting Physician (Endocrinology) Dustin Gimenez, MD as Consulting Physician (Urology) Adolphus Akin, MD as Consulting Physician (Rheumatology) Antonio Baumgarten, NP as Nurse Practitioner (Pulmonary Disease) Elta Halter, MD (Dermatology) Von Grumbling, MD as Referring Physician (Otolaryngology)  Indicate any recent Medical Services you may have received from other than [facility] providers in the past year (date may be approximate).     Assessment:   This is a routine wellness examination for [place].  Hearing/Vision screen Hearing Screening - Comments:: Denies hearing loss Vision Screening - Comments:: Needs eye exam, declined referral or list of eye doctors   Goals Addressed             This Visit's Progress    Patient Stated       Lose 15 lbs.       Depression Screen     12/30/2023   11:06 AM 07/01/2023    8:31 AM 12/24/2022   11:03 AM 06/25/2022    1:22 PM 05/10/2021    8:35 AM 05/03/2020    8:23 AM 04/27/2019    8:11 AM  PHQ 2/9 Scores  PHQ - 2 Score 0 0 0 0 0 0 0  PHQ- 9 Score 0 3 0 0 0 0     Fall Risk     12/30/2023   11:10 AM 12/23/2023   11:56 AM 07/01/2023    8:31 AM 12/24/2022   11:05 AM 12/21/2022   10:23 AM  Fall Risk   Falls in the past year? 1 1 1  0 0  Number falls in past yr: 0 0 0 0   Injury with Fall? 1 1 0 0   Risk for fall due to : History of fall(s);Impaired balance/gait;Orthopedic patient  History of fall(s) No Fall Risks   Follow up  Falls prevention discussed;Falls evaluation completed;Education provided  Falls evaluation completed Falls prevention discussed;Falls evaluation completed     MEDICARE RISK AT HOME:  Medicare Risk at Home Any stairs in or around the home?: Yes If so, are there any without handrails?: No Home free of loose throw rugs in walkways, pet beds, electrical cords, etc?: Yes Adequate lighting in your home to reduce risk of falls?: Yes Life alert?: No Use of a cane, walker or w/c?: No Grab bars in the bathroom?: No Shower chair or bench in shower?: Yes Elevated toilet seat or a handicapped toilet?: Yes  TIMED UP AND GO:  Was the test performed?  No  Cognitive Function: 6CIT completed        12/30/2023   11:10 AM 12/24/2022   11:08 AM  6CIT Screen  What Year? 0 points 0 points  What month? 0 points 0 points  What time? 0 points 0 points  Count back from 20 0 points 0 points  Months in reverse 0 points 0 points  Repeat phrase 0 points 0 points  Total Score 0 points 0 points    Immunizations Immunization History  Administered Date(s) Administered   Fluad Quad(high Dose 65+) 06/25/2022   Fluad Trivalent(High Dose 65+) 07/01/2023   Influenza,inj,Quad PF,6+ Mos 05/22/2015, 08/07/2016, 06/25/2018, 06/21/2019, 05/03/2020, 05/10/2021   Influenza,inj,Quad PF,6-35 Mos 05/22/2015, 08/07/2016  Janssen (J&J) SARS-COV-2 Vaccination 12/12/2019   Moderna Sars-Covid-2 Vaccination 07/30/2020   OPV 10/20/1995   PNEUMOCOCCAL CONJUGATE-20 06/25/2022   Pneumococcal Polysaccharide-23 04/22/2017   Tdap 12/12/2014   Yellow Fever 06/28/1992    Screening Tests Health Maintenance  Topic Date Due   Zoster Vaccines- Shingrix  (1 of 2) Never done   COVID-19 Vaccine (3 - 2024-25 season) 05/03/2023   INFLUENZA VACCINE  04/01/2024   DTaP/Tdap/Td (2 - Td or Tdap) 12/11/2024   Medicare Annual Wellness (AWV)  12/29/2024   Colonoscopy  06/08/2030   Pneumonia Vaccine 28+ Years old  Completed   Hepatitis C  Screening  Completed   HPV VACCINES  Aged Out   Meningococcal B Vaccine  Aged Out    Health Maintenance  Health Maintenance Due  Topic Date Due   Zoster Vaccines- Shingrix  (1 of 2) Never done   COVID-19 Vaccine (3 - 2024-25 season) 05/03/2023   Health Maintenance Items Addressed: See Nurse Notes  Additional Screening:  Vision Screening: Recommended annual ophthalmology exams for early detection of glaucoma and other disorders of the eye.  Dental Screening: Recommended annual dental exams for proper oral hygiene  Community Resource Referral / Chronic Care Management: CRR required this visit?  No   CCM required this visit?  No     Plan:     I have personally reviewed and noted the following in the patient's chart:   Medical and social history Use of alcohol, tobacco or illicit drugs  Current medications and supplements including opioid prescriptions. Patient is not currently taking opioid prescriptions. Functional ability and status Nutritional status Physical activity Advanced directives List of other physicians Hospitalizations, surgeries, and ER visits in previous 12 months Vitals Screenings to include cognitive, depression, and falls Referrals and appointments  In addition, I have reviewed and discussed with patient certain preventive protocols, quality metrics, and best practice recommendations. A written personalized care plan for preventive services as well as general preventive health recommendations were provided to patient.     Jaunita Messier, CMA   12/30/2023   After Visit Summary: (MyChart) Due to this being a telephonic visit, the after visit summary with patients personalized plan was offered to patient via MyChart   Notes: Please refer to Routing Comments.

## 2024-02-22 ENCOUNTER — Other Ambulatory Visit: Payer: Self-pay

## 2024-02-22 DIAGNOSIS — R0602 Shortness of breath: Secondary | ICD-10-CM

## 2024-02-25 ENCOUNTER — Encounter: Payer: Self-pay | Admitting: Internal Medicine

## 2024-02-25 ENCOUNTER — Ambulatory Visit (INDEPENDENT_AMBULATORY_CARE_PROVIDER_SITE_OTHER): Admitting: Internal Medicine

## 2024-02-25 ENCOUNTER — Ambulatory Visit: Admitting: Internal Medicine

## 2024-02-25 DIAGNOSIS — R0602 Shortness of breath: Secondary | ICD-10-CM

## 2024-02-25 DIAGNOSIS — J449 Chronic obstructive pulmonary disease, unspecified: Secondary | ICD-10-CM

## 2024-02-25 DIAGNOSIS — M05711 Rheumatoid arthritis with rheumatoid factor of right shoulder without organ or systems involvement: Secondary | ICD-10-CM

## 2024-02-25 LAB — PULMONARY FUNCTION TEST
DL/VA % pred: 125 %
DL/VA: 5.11 ml/min/mmHg/L
DLCO unc % pred: 83 %
DLCO unc: 21.87 ml/min/mmHg
FEF 25-75 Pre: 1.13 L/s
FEF2575-%Pred-Pre: 43 %
FEV1-%Pred-Pre: 58 %
FEV1-Pre: 1.95 L
FEV1FVC-%Pred-Pre: 92 %
FEV6-%Pred-Pre: 64 %
FEV6-Pre: 2.78 L
FEV6FVC-%Pred-Pre: 103 %
FVC-%Pred-Pre: 62 %
FVC-Pre: 2.83 L
Pre FEV1/FVC ratio: 69 %
Pre FEV6/FVC Ratio: 98 %
RV % pred: 81 %
RV: 1.93 L
TLC % pred: 66 %
TLC: 4.64 L

## 2024-02-25 NOTE — Progress Notes (Signed)
 [facility] Consultation     Date: 02/25/2024,   MRN# [id number] [name] Oct 09, 1955    AdmissionWeight: 183 lb 3.2 oz (83.1 kg)                 CurrentWeight: 183 lb 3.2 oz (83.1 kg) Nathan Anthony is a 68 y.o. old male seen in consultation for ASTHMA at the request of Dr. Delfin   Previous OV  PFT's show ratio 69% with Fev1 67% +BD response Scooping of exp limb,air trapping Findings c/w Moderate COPD   Patient has been having excessive daytime sleepiness -sleep study does NOT show evidence of OSA ONO does NOT reveal hypoxia -alpha one antitrypsin levels wnl   SOB and wheezing have improved His cat died 2 months ago-seems that his asthma and allergies are much better controlled Off of advair for 40 days and feels well  Had smoked pipe for 30 years    68 yo Dx in 1997 with ASTHMA, EIB had been on advair 100 bid for 15 years changed to 250 BID 1 year ago Dx with GERD 1996 Retired from [employer] in 2000 Had frequent ASTHMA attacks 10 years ago SLeep study negative of OSA and ONO negative for hypoxia I have reviewed studies with patient    CHIEF COMPLAINT:   Follow up asthma/COPD   HISTORY OF PRESENT ILLNESS   No exacerbation at this time No evidence of heart failure at this time No evidence or signs of infection at this time No respiratory distress No fevers, chills, nausea, vomiting, diarrhea No evidence of lower extremity edema No evidence hemoptysis   Symptoms  well controlled at this time Dymista  for allergic rhinitis but  uses Nasal Chrome from [employer]-works well        Current Medication:  Current Outpatient Medications:    Adalimumab 40 MG/0.8ML PNKT, Inject 40 mg into the skin every 14 (fourteen) days., Disp: , Rfl:    albuterol  (PROAIR  HFA) 108 (90 Base) MCG/ACT inhaler, USE 1 INHALATION EVERY 6 HOURS AS NEEDED FOR WHEEZING OR SHORTNESS OF BREATH (NEEDS APPOINTMENT FOR FURTHER REFILLS), Disp: 3 each, Rfl: 3    Aspirin-Acetaminophen -Caffeine (GOODYS EXTRA STRENGTH PO), Take 1 packet by mouth daily as needed (headaches)., Disp: , Rfl:    calcium -vitamin D  250-100 MG-UNIT tablet, Take 1 tablet by mouth daily., Disp: , Rfl:    fluorouracil  (EFUDEX ) 5 % cream, In January apply to the forehead and scalp BID x 7 days (Patient not taking: Reported on 12/30/2023), Disp: 30 g, Rfl: 0   folic acid (FOLVITE) 1 MG tablet, Take 1 mg by mouth daily., Disp: , Rfl:    ketotifen (ZADITOR) 0.025 % ophthalmic solution, Place 1 drop into both eyes once a week., Disp: , Rfl:    loratadine (CLARITIN) 10 MG tablet, Take 10 mg by mouth daily., Disp: , Rfl:    meloxicam (MOBIC) 15 MG tablet, Take 15 mg by mouth daily., Disp: , Rfl:    methotrexate (RHEUMATREX) 2.5 MG tablet, SMARTSIG:7 Tablet(s) By Mouth Once a Week (Patient not taking: Reported on 12/30/2023), Disp: , Rfl:    methotrexate (RHEUMATREX) 2.5 MG tablet, Take 2.5 mg by mouth once a week. 8 tablets by mouth once a week, Disp: , Rfl:    montelukast  (SINGULAIR ) 10 MG tablet, TAKE 1 TABLET DAILY, Disp: 90 tablet, Rfl: 3   Multiple Vitamin (MULTIVITAMIN) capsule, Take 1 capsule by mouth daily., Disp: , Rfl:    omeprazole  (PRILOSEC) 20 MG capsule, TAKE 1 CAPSULE DAILY,  Disp: 90 capsule, Rfl: 3   rosuvastatin  (CRESTOR ) 20 MG tablet, TAKE 1 TABLET DAILY (NEED APPOINTMENT FOR FURTHER REFILLS), Disp: 90 tablet, Rfl: 3   triamcinolone (NASACORT) 55 MCG/ACT AERO nasal inhaler, Place 2 sprays into the nose daily., Disp: , Rfl:     ALLERGIES   Pravastatin and Simvastatin  Pulse 70   Ht 5' 10 (1.778 m)   Wt 183 lb 3.2 oz (83.1 kg)   SpO2 98%   BMI 26.29 kg/m      Review of Systems: Gen:  Denies  fever, sweats, chills weight loss  HEENT: Denies blurred vision, double vision, ear pain, eye pain, hearing loss, nose bleeds, sore throat Cardiac:  No dizziness, chest pain or heaviness, chest tightness,edema, No JVD Resp:   No cough, -sputum production, -shortness of  breath,-wheezing, -hemoptysis,  Other:  All other systems negative   Physical Examination:   General Appearance: No distress  EYES PERRLA, EOM intact.   NECK Supple, No JVD Pulmonary: normal breath sounds, No wheezing.  CardiovascularNormal S1,S2.  No m/r/g.   Abdomen: Benign, Soft, non-tender. 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 All questions answered   Follow up    I spent a total of 40 minutes dedicated to the care of this patient on the date of this encounter to include pre-visit review of records, face-to-face time with the patient discussing conditions above, post visit ordering of testing, clinical documentation with the electronic health record, making appropriate referrals as documented, and communicating necessary information to the patient's healthcare team.    The Patient requires high complexity decision making for assessment and support, frequent evaluation and titration of therapies, application of advanced monitoring technologies and extensive interpretation of multiple databases.  Patient satisfied with Plan of action and management. All questions answered    Nickolas Alm Cellar, M.D.  [facility]  Medical Director [facility]  Director [facility] Cardio-Pulmonary Department

## 2024-02-25 NOTE — Patient Instructions (Signed)
 Full PFT completed today without post.

## 2024-02-25 NOTE — Progress Notes (Signed)
 [facility] [place] Pulmonary Medicine Consultation    Date: 02/25/2024,   MRN# [id number] Nathan Anthony 1956-03-14    Previous OV  PFT's show ratio 69% with Fev1 67% +BD response Scooping of exp limb,air trapping Findings c/w Moderate COPD  Patient has been having excessive daytime sleepiness -sleep study does NOT show evidence of OSA ONO does NOT reveal hypoxia -alpha one antitrypsin levels wnl  His cat died --seems that his asthma and allergies are much better controlled Off of advair for 40 days and feels well Had smoked pipe for 30 years    68 yo Dx in 1997 with ASTHMA, EIB had been on advair 100 bid for 15 years changed to 250 BID 1 year ago Dx with GERD 1996 Retired from [employer] in 2000 Had frequent ASTHMA attacks 10 years ago SLeep study negative of OSA and ONO negative for hypoxia  Pulmonary function test February 25, 2024 Findings reviewed in detail with patient 02/25/2024 FEV1 FVC ratio is 92% predicted FEV1 is 58% predicted FVC 62% predicted TLC is 66% predicted RV/TLC ratio is 119% predicted DLCO was 83% predicted Flow-volume loop shows obstructive pattern in the expiratory limb Overall the interpretation is a combination of restrictive pattern with obstructive airways disease clinical correlation advised with asthma     CHIEF COMPLAINT:   Follow up assessment of asthma   HISTORY OF PRESENT ILLNESS   No  exacerbation at this time No evidence of heart failure at this time No evidence or signs of infection at this time No respiratory distress No fevers, chills, nausea, vomiting, diarrhea No evidence of lower extremity edema No evidence hemoptysis  ASTHMA well controlled at this time With avoidance of triggers Patient uses mask and needed time he goes outside and does lawn work Patient stays away from cats   Patient currently on immunological agents Humira for rheumatoid arthritis Patient also on methotrexate Follows up with [facility] clinic  rheumatology No active signs of infection at this time   Patient with history of bladder cancer status post surgery Follow-up with urology  Current Medication:  Current Outpatient Medications:    Adalimumab 40 MG/0.8ML PNKT, Inject 40 mg into the skin every 14 (fourteen) days., Disp: , Rfl:    albuterol  (PROAIR  HFA) 108 (90 Base) MCG/ACT inhaler, USE 1 INHALATION EVERY 6 HOURS AS NEEDED FOR WHEEZING OR SHORTNESS OF BREATH (NEEDS APPOINTMENT FOR FURTHER REFILLS), Disp: 3 each, Rfl: 3   Aspirin-Acetaminophen -Caffeine (GOODYS EXTRA STRENGTH PO), Take 1 packet by mouth daily as needed (headaches)., Disp: , Rfl:    calcium -vitamin D  250-100 MG-UNIT tablet, Take 1 tablet by mouth daily., Disp: , Rfl:    fluorouracil  (EFUDEX ) 5 % cream, In January apply to the forehead and scalp BID x 7 days (Patient not taking: Reported on 12/30/2023), Disp: 30 g, Rfl: 0   folic acid (FOLVITE) 1 MG tablet, Take 1 mg by mouth daily., Disp: , Rfl:    ketotifen (ZADITOR) 0.025 % ophthalmic solution, Place 1 drop into both eyes once a week., Disp: , Rfl:    loratadine (CLARITIN) 10 MG tablet, Take 10 mg by mouth daily., Disp: , Rfl:    meloxicam (MOBIC) 15 MG tablet, Take 15 mg by mouth daily., Disp: , Rfl:    methotrexate (RHEUMATREX) 2.5 MG tablet, SMARTSIG:7 Tablet(s) By Mouth Once a Week (Patient not taking: Reported on 12/30/2023), Disp: , Rfl:    methotrexate (RHEUMATREX) 2.5 MG tablet, Take 2.5 mg by mouth once a week. 8 tablets by mouth once a week,  Disp: , Rfl:    montelukast  (SINGULAIR ) 10 MG tablet, TAKE 1 TABLET DAILY, Disp: 90 tablet, Rfl: 3   Multiple Vitamin (MULTIVITAMIN) capsule, Take 1 capsule by mouth daily., Disp: , Rfl:    omeprazole  (PRILOSEC) 20 MG capsule, TAKE 1 CAPSULE DAILY, Disp: 90 capsule, Rfl: 3   rosuvastatin  (CRESTOR ) 20 MG tablet, TAKE 1 TABLET DAILY (NEED APPOINTMENT FOR FURTHER REFILLS), Disp: 90 tablet, Rfl: 3   triamcinolone (NASACORT) 55 MCG/ACT AERO nasal inhaler, Place 2 sprays  into the nose daily., Disp: , Rfl:     ALLERGIES   Pravastatin and Simvastatin   BP (!) 150/98 (BP Location: Right Arm, Patient Position: Sitting, Cuff Size: Normal)   Pulse 70   Temp 98.4 F (36.9 C) (Oral)   Ht 5' 10 (1.778 m)   Wt 183 lb (83 kg)   SpO2 96%   BMI 26.26 kg/m    Review of Systems: Gen:  Denies  fever, sweats, chills weight loss  HEENT: Denies blurred vision, double vision, ear pain, eye pain, hearing loss, nose bleeds, sore throat Cardiac:  No dizziness, chest pain or heaviness, chest tightness,edema, No JVD Resp:   No cough, -sputum production, -shortness of breath,-wheezing, -hemoptysis,  Other:  All other systems negative   Physical Examination:   General Appearance: No distress  EYES PERRLA, EOM intact.   NECK Supple, No JVD Pulmonary: normal breath sounds, No wheezing.  CardiovascularNormal S1,S2.  No m/r/g.   Abdomen: Benign, Soft, non-tender. Neurology UE/LE 5/5 strength, no focal deficits Ext pulses intact, cap refill intact ALL OTHER ROS ARE NEGATIVE       ABSOLUTE EOS COUNT is 600 IgE levels 119   LABS   ASSESSMENT/PLAN  68 yo pleasant white male retired [employer] with h/o ASTHMA/COPD well controlled at this time with avoidance  Of triggers and control of allergic rhinitis    ASTHMA/COPD Well controlled at this time Only uses albuterol  as needed Albuterol  as needed No exacerbation at this time No evidence of heart failure at this time No evidence or signs of infection at this time No respiratory distress No fevers, chills, nausea, vomiting, diarrhea No evidence of lower extremity edema No evidence hemoptysis Avoid Allergens and Irritants Avoid secondhand smoke Avoid SICK contacts Recommend  Masking  when appropriate Recommend Keep up-to-date with vaccinations   Allergic Rhinitis Continue nasal Chrome,  claritan Responds well to this regimen    MEDICATION ADJUSTMENTS/LABS AND TESTS ORDERED: Albuterol  as needed Avoid  Allergens and Irritants Avoid secondhand smoke Avoid SICK contacts Recommend  Masking  when appropriate Recommend Keep up-to-date with vaccinations Follow-up rheumatology Follow-up urology  CURRENT MEDICATIONS REVIEWED AT LENGTH WITH PATIENT TODAY   Patient  satisfied with Plan of action and management. All questions answered   Follow up 1 year   I spent a total of 41 minutes dedicated to the care of this patient on the date of this encounter to include pre-visit review of records, face-to-face time with the patient discussing conditions above, post visit ordering of testing, clinical documentation with the electronic health record, making appropriate referrals as documented, and communicating necessary information to the patient's healthcare team.    The Patient requires high complexity decision making for assessment and support, frequent evaluation and titration of therapies, application of advanced monitoring technologies and extensive interpretation of multiple databases.  Patient satisfied with Plan of action and management. All questions answered    Nickolas Alm Cellar, M.D.  [facility] Pulmonary & Critical Care Medicine  Medical Director [facility]  Medical Director [facility] Cardio-Pulmonary Department

## 2024-02-25 NOTE — Progress Notes (Signed)
 Full PFT completed today without post per order.

## 2024-02-25 NOTE — Patient Instructions (Signed)
 Continue albuterol  as needed Avoid Allergens and Irritants Avoid secondhand smoke Avoid SICK contacts Recommend  Masking  when appropriate Recommend Keep up-to-date with vaccinations  Follow-up with rheumatology Follow-up with urology

## 2024-03-08 ENCOUNTER — Encounter: Payer: Self-pay | Admitting: Dermatology

## 2024-03-08 ENCOUNTER — Ambulatory Visit (INDEPENDENT_AMBULATORY_CARE_PROVIDER_SITE_OTHER): Payer: TRICARE For Life (TFL) | Admitting: Dermatology

## 2024-03-08 DIAGNOSIS — L578 Other skin changes due to chronic exposure to nonionizing radiation: Secondary | ICD-10-CM | POA: Diagnosis not present

## 2024-03-08 DIAGNOSIS — L719 Rosacea, unspecified: Secondary | ICD-10-CM

## 2024-03-08 DIAGNOSIS — L570 Actinic keratosis: Secondary | ICD-10-CM | POA: Diagnosis not present

## 2024-03-08 DIAGNOSIS — W908XXA Exposure to other nonionizing radiation, initial encounter: Secondary | ICD-10-CM | POA: Diagnosis not present

## 2024-03-08 DIAGNOSIS — D692 Other nonthrombocytopenic purpura: Secondary | ICD-10-CM | POA: Diagnosis not present

## 2024-03-08 NOTE — Patient Instructions (Addendum)

## 2024-03-08 NOTE — Progress Notes (Signed)
 Follow-Up Visit   Subjective  Nathan Anthony is a 68 y.o. male who presents for the following: AK f/u, 58m f/u, 09/2023 pt used 5FU/Calcipotriene to forehead and scalp with good reaction  The following portions of the chart were reviewed this encounter and updated as appropriate: medications, allergies, medical history  Review of Systems:  No other skin or systemic complaints except as noted in HPI or Assessment and Plan.  Objective  Well appearing patient in no apparent distress; mood and affect are within normal limits.  A focused examination was performed of the following areas: Scalp, face, ears, arms  Relevant exam findings are noted in the Assessment and Plan.  Left Ear x 1 Pink scaly macules  Assessment & Plan   AK (ACTINIC KERATOSIS) Left Ear x 1 Good results with 5FU/Calcipotriene to scalp and forehead  Actinic keratoses are precancerous spots that appear secondary to cumulative UV radiation exposure/sun exposure over time. They are chronic with expected duration over 1 year. A portion of actinic keratoses will progress to squamous cell carcinoma of the skin. It is not possible to reliably predict which spots will progress to skin cancer and so treatment is recommended to prevent development of skin cancer.  Recommend daily broad spectrum sunscreen SPF 30+ to sun-exposed areas, reapply every 2 hours as needed.  Recommend staying in the shade or wearing long sleeves, sun glasses (UVA+UVB protection) and wide brim hats (4-inch brim around the entire circumference of the hat). Call for new or changing lesions. Destruction of lesion - Left Ear x 1 Complexity: simple   Destruction method: cryotherapy   Informed consent: discussed and consent obtained   Timeout:  patient name, date of birth, surgical site, and procedure verified Lesion destroyed using liquid nitrogen: Yes   Region frozen until ice ball extended beyond lesion: Yes   Outcome: patient tolerated procedure  well with no complications   Post-procedure details: wound care instructions given     ACTINIC DAMAGE - chronic, secondary to cumulative UV radiation exposure/sun exposure over time - diffuse scaly erythematous macules with underlying dyspigmentation - Recommend daily broad spectrum sunscreen SPF 30+ to sun-exposed areas, reapply every 2 hours as needed.  - Recommend staying in the shade or wearing long sleeves, sun glasses (UVA+UVB protection) and wide brim hats (4-inch brim around the entire circumference of the hat). - Call for new or changing lesions.   Purpura - Chronic; persistent and recurrent.  Treatable, but not curable. arms - Violaceous macules and patches - Benign - Related to trauma, age, sun damage and/or use of blood thinners, chronic use of topical and/or oral steroids - Observe - Can use OTC arnica containing moisturizer such as Dermend Bruise Formula if desired - Call for worsening or other concerns  ROSACEA face Exam Mid face erythema with telangiectasias, minimal rhinophyma nose Chronic and persistent condition with duration or expected duration over one year. Condition is symptomatic / bothersome to patient. Not to goal. Rosacea is a chronic progressive skin condition usually affecting the face of adults, causing redness and/or acne bumps. It is treatable but not curable. It sometimes affects the eyes (ocular rosacea) as well. It may respond to topical and/or systemic medication and can flare with stress, sun exposure, alcohol, exercise, topical steroids (including hydrocortisone /cortisone 10) and some foods.  Daily application of broad spectrum spf 30+ sunscreen to face is recommended to reduce flares. Treatment Plan Restart otc acne treatment Pt declines Rx  Return for 9-12 months AK f/u.  I, Sonya  Hupman, RMA, am acting as scribe for Alm Rhyme, MD .   Documentation: I have reviewed the above documentation for accuracy and completeness, and I agree with  the above.  Alm Rhyme, MD

## 2024-03-16 DIAGNOSIS — Z7960 Long term (current) use of unspecified immunomodulators and immunosuppressants: Secondary | ICD-10-CM | POA: Insufficient documentation

## 2024-04-14 ENCOUNTER — Encounter: Payer: Self-pay | Admitting: Urology

## 2024-04-27 NOTE — Progress Notes (Signed)
   05/09/2024 8:54 AM   Nathan Anthony 08/31/1956 [id number]  Cystoscopy Procedure Note:  Indication:  Initial visit with me today Hx of LGTa (w/ 5% HG involvement), dx 2019  Prior HPI: Previously followed by Dr. 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 Cystoscopy was performed with a flexible cystoscope. The urethra, bladder neck and bladder mucosa were visualized in a systematic fashion. The ureteral orifices were noted in orthotopic location and orientation.  <1 cm centimeter papillary lesion noted along the left bladder base, mild erythema around lesion stalk. The prostate gland was s/p HOLEP, some minor Right lateral regrowth but patent channel.   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

## 2024-04-27 NOTE — H&P (View-Only) (Signed)
   05/09/2024 8:54 AM   Nathan Anthony 08/31/1956 [id number]  Cystoscopy Procedure Note:  Indication:  Initial visit with me today Hx of LGTa (w/ 5% HG involvement), dx 2019  Prior HPI: Previously followed by Dr. 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 Cystoscopy was performed with a flexible cystoscope. The urethra, bladder neck and bladder mucosa were visualized in a systematic fashion. The ureteral orifices were noted in orthotopic location and orientation.  <1 cm centimeter papillary lesion noted along the left bladder base, mild erythema around lesion stalk. The prostate gland was s/p HOLEP, some minor Right lateral regrowth but patent channel.   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

## 2024-05-09 ENCOUNTER — Other Ambulatory Visit: Payer: Self-pay

## 2024-05-09 ENCOUNTER — Ambulatory Visit: Admitting: Urology

## 2024-05-09 ENCOUNTER — Telehealth: Payer: Self-pay

## 2024-05-09 ENCOUNTER — Encounter: Payer: Self-pay | Admitting: Urology

## 2024-05-09 VITALS — BP 164/82 | HR 67 | Ht 70.0 in | Wt 180.0 lb

## 2024-05-09 DIAGNOSIS — N329 Bladder disorder, unspecified: Secondary | ICD-10-CM

## 2024-05-09 DIAGNOSIS — Z8551 Personal history of malignant neoplasm of bladder: Secondary | ICD-10-CM

## 2024-05-09 DIAGNOSIS — N138 Other obstructive and reflux uropathy: Secondary | ICD-10-CM

## 2024-05-09 DIAGNOSIS — N401 Enlarged prostate with lower urinary tract symptoms: Secondary | ICD-10-CM | POA: Diagnosis not present

## 2024-05-09 LAB — URINALYSIS, COMPLETE
Bilirubin, UA: NEGATIVE
Glucose, UA: NEGATIVE
Ketones, UA: NEGATIVE
Leukocytes,UA: NEGATIVE
Nitrite, UA: NEGATIVE
Protein,UA: NEGATIVE
RBC, UA: NEGATIVE
Specific Gravity, UA: 1.025 (ref 1.005–1.030)
Urobilinogen, Ur: 0.2 mg/dL (ref 0.2–1.0)
pH, UA: 6 (ref 5.0–7.5)

## 2024-05-09 LAB — MICROSCOPIC EXAMINATION

## 2024-05-09 NOTE — Progress Notes (Signed)
   [facility] Surgical Posting Form  Surgery Date: Date: 05/17/2024  Surgeon: Dr. Penne Skye, MD  Inpt ( No  )   Outpt (Yes)   Obs ( No  )   Diagnosis: N32.9 Bladder Lesion  -CPT: 47765, [phone]  Surgery: Transurethral Resection of Bladder Tumor with Intravesical Instillation of Gemcitabine   Stop Anticoagulations: Yes, may continue ASA  Cardiac/Medical/Pulmonary Clearance needed: no  *Orders entered into EPIC  Date: 05/09/24   *Case booked in [place]  Date: 05/09/24  *Notified pt of Surgery: Date: 05/09/24  PRE-OP UA & CX: no  *Placed into Prior Authorization Work Que Date: 05/09/24  Assistant/laser/rep:No

## 2024-05-09 NOTE — Telephone Encounter (Signed)
 Per Dr. Georganne, Patient is to be scheduled for Transurethral Resection of Bladder Tumor with Intravesical Instillation of Gemcitabine    [name] was contacted and possible surgical dates were discussed, Tuesday September 16th, 2025 was agreed upon for surgery.   Patient was directed to call [phone] between 1-3pm the day before surgery to find out surgical arrival time.  Instructions were given not to eat or drink from midnight on the night before surgery and have a driver for the day of surgery. On the surgery day patient was instructed to enter through the Medical Mall entrance of [facility] report the Same Day Surgery desk.   Pre-Admit Testing will be in contact via phone to set up an interview with the anesthesia team to review your history and medications prior to surgery.   Reminder of this information was sent via MyChart to the patient.

## 2024-05-09 NOTE — Progress Notes (Signed)
 Surgical Physician Order Form [facility]  Dr. Georganne, MD  * Scheduling expectation : Next Available  *Length of Case: 30 min  *Clearance needed: no  *Anticoagulation Instructions: Hold all anticoagulants  *Aspirin Instructions: Ok to continue Aspirin  *Post-op visit Date/Instructions:  TBD  *Diagnosis: Bladder Lesion  *Procedure: bilateral TURBT <2cm (47765)   Additional orders: Gemcitabine  2000mg  bladder instillation  -Admit type: OUTpatient  -Anesthesia: General  -VTE Prophylaxis Standing Order SCD's       Other:   -Standing Lab Orders Per Anesthesia    Lab other: None  -Standing Test orders EKG/Chest x-ray per Anesthesia       Test other:   - Medications:  Ancef  2gm IV  -Other orders:  N/A

## 2024-05-09 NOTE — Addendum Note (Signed)
 Addended by: GEORGANNE PENNE SAUNDERS on: 05/09/2024 08:59 AM   Modules accepted: Orders

## 2024-05-10 ENCOUNTER — Other Ambulatory Visit: Payer: Self-pay | Admitting: Urology

## 2024-05-12 ENCOUNTER — Other Ambulatory Visit: Payer: Self-pay

## 2024-05-12 ENCOUNTER — Encounter
Admission: RE | Admit: 2024-05-12 | Discharge: 2024-05-12 | Disposition: A | Discharge status: Home / Self Care | Source: Ambulatory Visit | Attending: Urology | Admitting: Urology

## 2024-05-12 DIAGNOSIS — Z01810 Encounter for preprocedural cardiovascular examination: Secondary | ICD-10-CM

## 2024-05-12 DIAGNOSIS — D494 Neoplasm of unspecified behavior of bladder: Secondary | ICD-10-CM

## 2024-05-12 DIAGNOSIS — E782 Mixed hyperlipidemia: Secondary | ICD-10-CM

## 2024-05-12 DIAGNOSIS — J449 Chronic obstructive pulmonary disease, unspecified: Secondary | ICD-10-CM

## 2024-05-12 HISTORY — DX: Personal history of nicotine dependence: Z87.891

## 2024-05-12 HISTORY — DX: Malignant neoplasm of bladder, unspecified: C67.9

## 2024-05-12 HISTORY — DX: Other obstructive and reflux uropathy: N13.8

## 2024-05-12 HISTORY — DX: Atherosclerosis of aorta: I70.0

## 2024-05-12 NOTE — Patient Instructions (Signed)
 Your procedure is scheduled on:05-17-24 Tuesday Report to the Registration Desk on the 1st floor of the Medical Mall.Then proceed to the 2nd floor Surgery Desk To find out your arrival time, please call [phone] between 1PM - 3PM on:05-16-24 Monday If your arrival time is 6:00 am, do not arrive before that time as the Medical Mall entrance doors do not open until 6:00 am.  REMEMBER: Instructions that are not followed completely may result in serious medical risk, up to and including death; or upon the discretion of your surgeon and anesthesiologist your surgery may need to be rescheduled.  Do not eat food OR drink liquids after midnight the night before surgery.  No gum chewing or hard candies.  One week prior to surgery:Stop NOW (05-12-24) Stop Anti-inflammatories (NSAIDS) such as meloxicam (MOBIC) Advil, Aleve, Ibuprofen, Motrin, Naproxen, Naprosyn and Aspirin based products such as Excedrin, Goody's Powder, BC Powder. Stop ANY OVER THE COUNTER supplements until after surgery (Calcium  + Vitamin D , Folic Acid, Multivitamin)  You may however, continue to take Tylenol  if needed for pain up until the day of surgery.  Continue taking all of your other prescription medications up until the day of surgery.  ON THE DAY OF SURGERY ONLY TAKE THESE MEDICATIONS WITH SIPS OF WATER : -loratadine (CLARITIN)  -omeprazole  (PRILOSEC)   Use your Albuterol  Inhaler the day of surgery and bring your Inhaler to the hospital  No Alcohol for 24 hours before or after surgery.  No Smoking including e-cigarettes for 24 hours before surgery.  No chewable tobacco products for at least 6 hours before surgery.  No nicotine patches on the day of surgery.  Do not use any recreational drugs for at least a week (preferably 2 weeks) before your surgery.  Please be advised that the combination of cocaine and anesthesia may have negative outcomes, up to and including death. If you test positive for cocaine, your  surgery will be cancelled.  On the morning of surgery brush your teeth with toothpaste and water , you may rinse your mouth with mouthwash if you wish. Do not swallow any toothpaste or mouthwash.  Do not wear jewelry, make-up, hairpins, clips or nail polish.  For welded (permanent) jewelry: bracelets, anklets, waist bands, etc.  Please have this removed prior to surgery.  If it is not removed, there is a chance that hospital personnel will need to cut it off on the day of surgery.  Do not wear lotions, powders, or perfumes.   Do not shave body hair from the neck down 48 hours before surgery.  Contact lenses, hearing aids and dentures may not be worn into surgery.  Do not bring valuables to the hospital. [facility] is not responsible for any missing/lost belongings or valuables.   Notify your doctor if there is any change in your medical condition (cold, fever, infection).  Wear comfortable clothing (specific to your surgery type) to the hospital.  After surgery, you can help prevent lung complications by doing breathing exercises.  Take deep breaths and cough every 1-2 hours. Your doctor may order a device called an Incentive Spirometer to help you take deep breaths. When coughing or sneezing, hold a pillow firmly against your incision with both hands. This is called "splinting." Doing this helps protect your incision. It also decreases belly discomfort.  If you are being admitted to the hospital overnight, leave your suitcase in the car. After surgery it may be brought to your room.  In case of increased patient census, it may be  necessary for you, the patient, to continue your postoperative care in the Same Day Surgery department.  If you are being discharged the day of surgery, you will not be allowed to drive home. You will need a responsible individual to drive you home and stay with you for 24 hours after surgery.   If you are taking public transportation, you will need to have  a responsible individual with you.  Please call the Pre-admissions Testing Dept. at [phone] if you have any questions about these instructions.  Surgery Visitation Policy:  Patients having surgery or a procedure may have two visitors.  Children under the age of 64 must have an adult with them who is not the patient.   Merchandiser, retail to address health-related social needs:  [email or web address]

## 2024-05-13 ENCOUNTER — Encounter
Admission: RE | Admit: 2024-05-13 | Discharge: 2024-05-13 | Disposition: A | Discharge status: Home / Self Care | Source: Ambulatory Visit | Attending: Urology | Admitting: Urology

## 2024-05-13 DIAGNOSIS — Z01810 Encounter for preprocedural cardiovascular examination: Secondary | ICD-10-CM | POA: Diagnosis not present

## 2024-05-13 DIAGNOSIS — E782 Mixed hyperlipidemia: Secondary | ICD-10-CM | POA: Diagnosis not present

## 2024-05-13 DIAGNOSIS — Z01818 Encounter for other preprocedural examination: Secondary | ICD-10-CM | POA: Diagnosis present

## 2024-05-13 DIAGNOSIS — J449 Chronic obstructive pulmonary disease, unspecified: Secondary | ICD-10-CM | POA: Diagnosis present

## 2024-05-13 LAB — CYTOLOGY PLUS MONITORING PROFILE: PAP & FEULGEN

## 2024-05-17 ENCOUNTER — Ambulatory Visit
Admission: RE | Admit: 2024-05-17 | Discharge: 2024-05-17 | Disposition: A | Discharge status: Home / Self Care | Attending: Urology | Admitting: Urology

## 2024-05-17 ENCOUNTER — Ambulatory Visit: Payer: Self-pay | Admitting: Anesthesiology

## 2024-05-17 ENCOUNTER — Encounter
Admission: RE | Disposition: A | Discharge status: Home / Self Care | Payer: Self-pay | Source: Home / Self Care | Attending: Urology

## 2024-05-17 ENCOUNTER — Encounter: Payer: Self-pay | Admitting: Urology

## 2024-05-17 DIAGNOSIS — N329 Bladder disorder, unspecified: Secondary | ICD-10-CM

## 2024-05-17 DIAGNOSIS — C679 Malignant neoplasm of bladder, unspecified: Secondary | ICD-10-CM

## 2024-05-17 DIAGNOSIS — J4489 Other specified chronic obstructive pulmonary disease: Secondary | ICD-10-CM | POA: Insufficient documentation

## 2024-05-17 DIAGNOSIS — Z87891 Personal history of nicotine dependence: Secondary | ICD-10-CM | POA: Insufficient documentation

## 2024-05-17 DIAGNOSIS — K219 Gastro-esophageal reflux disease without esophagitis: Secondary | ICD-10-CM | POA: Insufficient documentation

## 2024-05-17 SURGERY — TURBT (TRANSURETHRAL RESECTION OF BLADDER TUMOR)
Anesthesia: General | Site: Bladder

## 2024-05-17 MED ORDER — OXYCODONE HCL 5 MG/5ML PO SOLN: 5.0000 mg | Freq: Once | ORAL | Status: DC | PRN

## 2024-05-17 MED ORDER — CEFAZOLIN SODIUM-DEXTROSE 2-4 GM/100ML-% IV SOLN
2.0000 g | INTRAVENOUS | Status: AC
  Administered 2024-05-17: 2 g via INTRAVENOUS

## 2024-05-17 MED ORDER — SODIUM CHLORIDE 0.9 % IR SOLN
Status: DC | PRN
  Administered 2024-05-17: 6000 mL

## 2024-05-17 MED ORDER — LACTATED RINGERS IV SOLN: INTRAVENOUS | Status: DC

## 2024-05-17 MED ORDER — CHLORHEXIDINE GLUCONATE 0.12 % MT SOLN
15.0000 mL | Freq: Once | OROMUCOSAL | Status: AC
  Administered 2024-05-17: 15 mL via OROMUCOSAL

## 2024-05-17 MED ORDER — LIDOCAINE HCL (CARDIAC) PF 100 MG/5ML IV SOSY
PREFILLED_SYRINGE | INTRAVENOUS | Status: DC | PRN
  Administered 2024-05-17: 100 mg via INTRAVENOUS

## 2024-05-17 MED ORDER — MIDAZOLAM HCL 2 MG/2ML IJ SOLN
INTRAMUSCULAR | Status: AC
  Filled 2024-05-17: qty 2

## 2024-05-17 MED ORDER — PROPOFOL 1000 MG/100ML IV EMUL
INTRAVENOUS | Status: AC
  Filled 2024-05-17: qty 100

## 2024-05-17 MED ORDER — FENTANYL CITRATE (PF) 100 MCG/2ML IJ SOLN
INTRAMUSCULAR | Status: DC | PRN
  Administered 2024-05-17 (×2): 50 ug via INTRAVENOUS

## 2024-05-17 MED ORDER — CHLORHEXIDINE GLUCONATE 0.12 % MT SOLN
OROMUCOSAL | Status: AC
Start: 2024-05-17 — End: 2024-05-17
  Filled 2024-05-17: qty 15

## 2024-05-17 MED ORDER — CEFAZOLIN SODIUM-DEXTROSE 2-4 GM/100ML-% IV SOLN
INTRAVENOUS | Status: AC
  Filled 2024-05-17: qty 100

## 2024-05-17 MED ORDER — FENTANYL CITRATE (PF) 100 MCG/2ML IJ SOLN: 25.0000 ug | INTRAMUSCULAR | Status: DC | PRN

## 2024-05-17 MED ORDER — ORAL CARE MOUTH RINSE: 15.0000 mL | Freq: Once | OROMUCOSAL | Status: AC

## 2024-05-17 MED ORDER — GEMCITABINE CHEMO FOR BLADDER INSTILLATION 2000 MG
INTRAVENOUS | Status: DC | PRN
  Administered 2024-05-17: 2000 mg via INTRAVESICAL

## 2024-05-17 MED ORDER — MIDAZOLAM HCL 2 MG/2ML IJ SOLN
INTRAMUSCULAR | Status: DC | PRN
  Administered 2024-05-17: 2 mg via INTRAVENOUS

## 2024-05-17 MED ORDER — FENTANYL CITRATE (PF) 100 MCG/2ML IJ SOLN
INTRAMUSCULAR | Status: AC
  Filled 2024-05-17: qty 2

## 2024-05-17 MED ORDER — PROPOFOL 500 MG/50ML IV EMUL
INTRAVENOUS | Status: DC | PRN
  Administered 2024-05-17: 200 ug/kg/min via INTRAVENOUS
  Administered 2024-05-17: 100 mg via INTRAVENOUS

## 2024-05-17 MED ORDER — GEMCITABINE CHEMO FOR BLADDER INSTILLATION 2000 MG
2000.0000 mg | Freq: Once | INTRAVENOUS | Status: DC
  Filled 2024-05-17: qty 52.6

## 2024-05-17 MED ORDER — STERILE WATER FOR IRRIGATION IR SOLN
Status: DC | PRN
Start: 2024-05-17 — End: 2024-05-17
  Administered 2024-05-17: 1000 mL

## 2024-05-17 MED ORDER — OXYCODONE HCL 5 MG PO TABS: 5.0000 mg | ORAL_TABLET | Freq: Once | ORAL | Status: DC | PRN

## 2024-05-17 SURGICAL SUPPLY — 21 items
BAG DRAIN SIEMENS DORNER NS (MISCELLANEOUS) ×1 IMPLANT
BAG URINE DRAIN 2000ML AR STRL (UROLOGICAL SUPPLIES) ×1 IMPLANT
BRUSH SCRUB EZ 4% CHG (MISCELLANEOUS) ×1 IMPLANT
CATH FOL 2WAY LX 18X30 (CATHETERS) IMPLANT
CATH URTH 16FR FL 2W BLN LF (CATHETERS) IMPLANT
DRAPE UTILITY 15X26 TOWEL STRL (DRAPES) ×1 IMPLANT
DRSG TELFA 3X4 N-ADH STERILE (GAUZE/BANDAGES/DRESSINGS) ×1 IMPLANT
ELECTRODE LOOP 22F BIPOLAR SML (ELECTROSURGICAL) IMPLANT
ELECTRODE REM PT RTRN 9FT ADLT (ELECTROSURGICAL) IMPLANT
GOWN STRL REUS W/ TWL LRG LVL3 (GOWN DISPOSABLE) ×2 IMPLANT
KIT TURNOVER CYSTO (KITS) ×1 IMPLANT
LOOP CUT BIPOLAR 24F LRG (ELECTROSURGICAL) IMPLANT
NDL SAFETY ECLIPSE 18X1.5 (NEEDLE) ×1 IMPLANT
PACK CYSTO AR (MISCELLANEOUS) ×1 IMPLANT
PAD ARMBOARD POSITIONER FOAM (MISCELLANEOUS) ×1 IMPLANT
SET IRRIG Y TYPE TUR BLADDER L (SET/KITS/TRAYS/PACK) ×1 IMPLANT
SOL .9 NS 3000ML IRR UROMATIC (IV SOLUTION) ×2 IMPLANT
SURGILUBE 2OZ TUBE FLIPTOP (MISCELLANEOUS) ×1 IMPLANT
SYRINGE TOOMEY IRRIG 70ML (MISCELLANEOUS) ×1 IMPLANT
WATER STERILE IRR 1000ML POUR (IV SOLUTION) ×1 IMPLANT
WATER STERILE IRR 500ML POUR (IV SOLUTION) ×1 IMPLANT

## 2024-05-17 NOTE — Anesthesia Postprocedure Evaluation (Signed)
 Anesthesia Post Note  Patient: Nathan Anthony  Procedure(s) Performed: TURBT (TRANSURETHRAL RESECTION OF BLADDER TUMOR) (Bladder) INSTILLATION, BLADDER (Bladder)  Patient location during evaluation: PACU Anesthesia Type: General Level of consciousness: awake and alert Pain management: pain level controlled Vital Signs Assessment: post-procedure vital signs reviewed and stable Respiratory status: spontaneous breathing, nonlabored ventilation, respiratory function stable and patient connected to nasal cannula oxygen Cardiovascular status: blood pressure returned to baseline and stable Postop Assessment: no apparent nausea or vomiting Anesthetic complications: no   There were no known notable events for this encounter.   Last Vitals:  Vitals:   05/17/24 0904 05/17/24 0929  BP:  (!) 164/88  Pulse: (!) 55 (!) 59  Resp: 17 16  Temp: 36.8 C 36.8 C  SpO2: 96% 97%    Last Pain:  Vitals:   05/17/24 0929  TempSrc: Temporal  PainSc: 0-No pain                 Debby Mines

## 2024-05-17 NOTE — Progress Notes (Signed)
 Unclamped foley per order.  Pt tolerated well.

## 2024-05-17 NOTE — Transfer of Care (Signed)
 Immediate Anesthesia Transfer of Care Note  Patient: Nathan Anthony  Procedure(s) Performed: TURBT (TRANSURETHRAL RESECTION OF BLADDER TUMOR) INSTILLATION, BLADDER  Patient Location: PACU  Anesthesia Type:General  Level of Consciousness: drowsy  Airway & Oxygen Therapy: Patient Spontanous Breathing and Patient connected to face mask oxygen  Post-op Assessment: Report given to RN and Post -op Vital signs reviewed and stable  Post vital signs: Reviewed and stable  Last Vitals:  Vitals Value Taken Time  BP 108/61 05/17/24 08:04  Temp 36.7 C 05/17/24 08:04  Pulse 65 05/17/24 08:08  Resp 15 05/17/24 08:08  SpO2 96 % 05/17/24 08:08  Vitals shown include unfiled device data.  Last Pain:  Vitals:   05/17/24 0804  TempSrc:   PainSc: Asleep      Patients Stated Pain Goal: 2 (05/17/24 9375)  Complications: There were no known notable events for this encounter.

## 2024-05-17 NOTE — Discharge Instructions (Signed)
 Transurethral Resection of Bladder Tumor (TURBT) or Bladder Biopsy   Definition:  Transurethral Resection of the Bladder Tumor is a surgical procedure used to diagnose and remove tumors within the bladder. TURBT is the most common treatment for early stage bladder cancer.  General instructions:     Your recent bladder surgery requires very little post hospital care but some definite precautions.  Despite the fact that no skin incisions were used, the area around the bladder incisions are raw and covered with scabs to promote healing and prevent bleeding. Certain precautions are needed to insure that the scabs are not disturbed over the next 2-4 weeks while the healing proceeds.  Because the raw surface inside your bladder and the irritating effects of urine you may expect frequency of urination and/or urgency (a stronger desire to urinate) and perhaps even getting up at night more often. This will usually resolve or improve slowly over the healing period. You may see some blood in your urine over the first 6 weeks. Do not be alarmed, even if the urine was clear for a while. Get off your feet and drink lots of fluids until clearing occurs. If you start to pass clots or don't improve call us .  Diet:  You may return to your normal diet immediately. Because of the raw surface of your bladder, alcohol, spicy foods, foods high in acid and drinks with caffeine may cause irritation or frequency and should be used in moderation. To keep your urine flowing freely and avoid constipation, drink plenty of fluids during the day (8-10 glasses). Tip: Avoid cranberry juice because it is very acidic.  Activity:  Your physical activity doesn't need to be restricted. However, if you are very active, you may see some blood in the urine. We suggest that you reduce your activity under the circumstances until the bleeding has stopped.  Bowels:  It is important to keep your bowels regular during the postoperative  period. Straining with bowel movements can cause bleeding. A bowel movement every other day is reasonable. Use a mild laxative if needed, such as milk of magnesia 2-3 tablespoons, or 2 Dulcolax tablets. Call if you continue to have problems. If you had been taking narcotics for pain, before, during or after your surgery, you may be constipated. Take a laxative if necessary.   Medication:  You should resume your pre-surgery medications unless told not to. In addition you may be given an antibiotic to prevent or treat infection. Antibiotics are not always necessary. All medication should be taken as prescribed until the bottles are finished unless you are having an unusual reaction to one of the drugs.   [facility] [address] [place], [place] [address] [phone]

## 2024-05-17 NOTE — Op Note (Signed)
 Date of procedure: 05/17/24  Preoperative diagnosis:  Non-muscle invasive bladder Ca   Postoperative diagnosis:  Non-muscle invasive bladder Ca    Procedure: Cystoscopy w/ TURBT < 2cm Instillation of intravesical Gemcitabine , chemotherapy  Surgeon: Penne Skye, MD  Anesthesia: General  Complications: None  Intraoperative findings:  Small x2 papillary tumors along Left bladder side wall, ~1 cm total volume (medial to prior TUR scar)  S/p complete resection down to muscularis Instillation of 2g gemcitabine    EBL: 3 cc  Specimens: 1. Bladder tumor  Drains: 78F 2-way catheter with 10cc balloon  Indication: Nathan Anthony is a 68 y.o. patient with a hx of recurrent LGTa (focal HGTa) NMIBC since 2019, last recurrene in 2020. Recent cystoscopy revealed a small papillary tumor along Left trigone.  After reviewing the management options for treatment, they elected to proceed with the above surgical procedure(s). We have discussed the potential benefits and risks of the procedure, side effects of the proposed treatment, the likelihood of the patient achieving the goals of the procedure, and any potential problems that might occur during the procedure or recuperation. Informed consent has been obtained.  Description of procedure:  The patient was taken to the operating room and general anesthesia was induced. SCDs were placed for DVT prophylaxis. The patient was placed in the dorsal lithotomy position, prepped and draped in the usual sterile fashion, and preoperative antibiotics were administered. A preoperative time-out was performed.   We began with rigid cystoscopy and a complete inspection of the urethra and bladder was performed.  Bilateral ureteral orifices were noted in orthotopic location.  2 small papillary tumors were noted along the left bladder sidewall, medial to the prior TUR scar. We transitioned to a 43F resectoscope with Bipolar loop electrocautery.  The visible  lesions were completely resected down to the muscularis.  Hemostasis was achieved with careful electrocautery along the periphery of our resection beds.   A 16 French two-way Foley catheter was placed, 10 cc sterile water  in balloon.  The bladder was drained.  Next, 2 g gemcitabine  was instilled via catheter port into the bladder.  Foley clamped with hemostats x 2.  At this point all instruments were removed and this concluded the procedure.  Patient tolerated well, was extubated and transferred to the PACU in excellent condition.  Disposition: Stable to PACU   Plan:  Post-op Gemcitabine :  2g intravesical Gemcitabine  was instilled intraoperatively at 0755; dwell for 1 hour in PACU then drain to chemo-designated container. Nursing: use chemo PPE, closed system, dispose urine/catheters/linens as hazardous waste; instruct patient to void sitting, double flush, wash after urination 24 hrs  Penne Skye, MD

## 2024-05-17 NOTE — Interval H&P Note (Signed)
 History and Physical Interval Note:  05/17/2024 6:20 AM  Nathan Anthony Record  has presented today for surgery, with the diagnosis of Bladder Lesion.  The various methods of treatment have been discussed with the patient and family. After consideration of risks, benefits and other options for treatment, the patient has consented to  Procedure(s): TURBT (TRANSURETHRAL RESECTION OF BLADDER TUMOR) (N/A) INSTILLATION, BLADDER (N/A) as a surgical intervention.  The patient's history has been reviewed, patient examined, no change in status, stable for surgery.  I have reviewed the patient's chart and labs.  Questions were answered to the patient's satisfaction.     Nathan Anthony

## 2024-05-17 NOTE — Anesthesia Preprocedure Evaluation (Signed)
 Anesthesia Evaluation  Patient identified by MRN, date of birth, ID band Patient awake    Reviewed: Allergy & Precautions, H&P , NPO status , Patient's Chart, lab work & pertinent test results  Airway Mallampati: II  TM Distance: >3 FB Neck ROM: full    Dental no notable dental hx. (+) Chipped   Pulmonary neg pulmonary ROS, asthma , COPD,  COPD inhaler, former smoker   Pulmonary exam normal breath sounds clear to auscultation       Cardiovascular Exercise Tolerance: Good negative cardio ROS Normal cardiovascular exam Rhythm:regular Rate:Normal     Neuro/Psych negative neurological ROS  negative psych ROS   GI/Hepatic negative GI ROS, Neg liver ROS,GERD  Medicated,,  Endo/Other  negative endocrine ROS    Renal/GU negative Renal ROS  negative genitourinary   Musculoskeletal   Abdominal   Peds  Hematology negative hematology ROS (+)   Anesthesia Other Findings Past Medical History: No date: Abdominal aortic atherosclerosis (HCC) No date: Allergy No date: Asthma No date: Bladder cancer (HCC) No date: BPH with obstruction/lower urinary tract symptoms No date: Calcified granuloma of lung 04/22/2018: Chronic obstructive pulmonary disease (HCC) No date: Complication of anesthesia     Comment:  Asthmatic episode when awakening from colonoscopy No date: DISH (diffuse idiopathic skeletal hyperostosis)     Comment:  neck pain No date: Former smoker No date: GERD (gastroesophageal reflux disease) No date: Hyperlipidemia No date: Inguinal hernia     Comment:  Left Side No date: Nasal congestion     Comment:  nasal polyps No date: Osteopenia No date: Osteoporosis No date: RA (rheumatoid arthritis) (HCC)     Comment:  shoulders and hands No date: Sciatica No date: Tinnitus aurium, left  Past Surgical History: No date: BASAL CELL CARCINOMA EXCISION 06/06/2010: COLONOSCOPY 06/08/2020: COLONOSCOPY WITH PROPOFOL ;  N/A     Comment:  Procedure: COLONOSCOPY WITH PROPOFOL ;  Surgeon: Jinny Carmine, MD;  Location: [facility];  Service:               Endoscopy;  Laterality: N/A;  priority 4 07/03/2020: ETHMOIDECTOMY; Bilateral     Comment:  Procedure: ETHMOIDECTOMY;  Surgeon: Blair Mt, MD;                Location: [facility];  Service: ENT;                Laterality: Bilateral; 07/03/2020: FRONTAL SINUS EXPLORATION; Bilateral     Comment:  Procedure: FRONTAL SINUS EXPLORATION;  Surgeon: Blair Mt, MD;  Location: [facility];  Service: ENT;               Laterality: Bilateral; No date: HERNIA REPAIR 09/23/2021: HOLEP-LASER ENUCLEATION OF THE PROSTATE WITH  MORCELLATION; N/A     Comment:  Procedure: HOLEP-LASER ENUCLEATION OF THE PROSTATE WITH               MORCELLATION;  Surgeon: Penne Knee, MD;  Location:               [facility];  Service: Urology;  Laterality: N/A; 07/03/2020: IMAGE GUIDED SINUS SURGERY; N/A     Comment:  Procedure: IMAGE GUIDED SINUS SURGERY;  Surgeon:               Blair Mt, MD;  Location: [facility];  Service: ENT;  Laterality: N/A;  need [employer] disk PLACE              DISK ON OR CHARGE NURSE DESK 10-01 KP Another disk on OR              charge nurse desk 10.14.21 DS 07/03/2020: MAXILLARY ANTROSTOMY; Bilateral     Comment:  Procedure: MAXILLARY ANTROSTOMY with tissue removal;                Surgeon: Blair Mt, MD;  Location: [facility]               [facility];  Service: ENT;  Laterality: Bilateral; 07/03/2020: NASAL SEPTOPLASTY W/ TURBINOPLASTY; Bilateral     Comment:  Procedure: NASAL SEPTOPLASTY;  Surgeon: Blair Mt,               MD;  Location: [facility];  Service: ENT;                Laterality: Bilateral; 07/03/2020: SPHENOIDECTOMY; Bilateral     Comment:  Procedure: SPHENOIDECTOMY;  Surgeon: Blair Mt, MD;               Location: [facility];   Service: ENT;                Laterality: Bilateral; 06/21/2018: TRANSURETHRAL RESECTION OF BLADDER TUMOR WITH MITOMYCIN - C; N/A     Comment:  Procedure: TRANSURETHRAL RESECTION OF BLADDER TUMOR WITH              Gemcitabine ;  Surgeon: Penne Knee, MD;  Location:               [facility] OR[facility];  Service: Urology;  Laterality: N/A; No date: VARICOCELECTOMY  BMI    Body Mass Index: 25.83 kg/m      Reproductive/Obstetrics negative OB ROS                              Anesthesia Physical Anesthesia Plan  ASA: 3  Anesthesia Plan: General   Post-op Pain Management:    Induction: Intravenous  PONV Risk Score and Plan: 2 and Propofol  infusion, TIVA, Midazolam  and Ondansetron   Airway Management Planned: Natural Airway and Nasal Cannula  Additional Equipment:   Intra-op Plan:   Post-operative Plan:   Informed Consent: I have reviewed the patients History and Physical, chart, labs and discussed the procedure including the risks, benefits and alternatives for the proposed anesthesia with the patient or authorized representative who has indicated his/her understanding and acceptance.     Dental Advisory Given  Plan Discussed with: Anesthesiologist, CRNA and Surgeon  Anesthesia Plan Comments: (Patient consented for risks of anesthesia including but not limited to:  - adverse reactions to medications - risk of airway placement if required - damage to eyes, teeth, lips or other oral mucosa - nerve damage due to positioning  - sore throat or hoarseness - Damage to heart, brain, nerves, lungs, other parts of body or loss of life  Patient voiced understanding and assent.)        Anesthesia Quick Evaluation

## 2024-05-18 ENCOUNTER — Encounter: Payer: Self-pay | Admitting: Urology

## 2024-05-18 LAB — SURGICAL PATHOLOGY

## 2024-05-19 NOTE — Assessment & Plan Note (Addendum)
 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  He had 2 very small recurrent lesions consistent with LGTa.  Considering the long interval since his last recurrence in 2020, I think his risk group remains intermediate.  I reviewed the role of induction intravesical therapies--we would need to consider gemcitabine  rather than standard BCG considering his history of chronic methotrexate/Humira.  Alternatively, I would be fine with continuation of routine cystoscopic surveillance (and even office fulguration as needed) with periodic tissue sampling.   - plan for office cystoscopy in 6 months - CTU next available-he is due for surveillance upper tract imaging, along with subacute right flank pain, we will go ahead and get this imaging

## 2024-05-19 NOTE — Progress Notes (Unsigned)
   05/20/2024 7:39 AM   Nathan Anthony Sep 28, 1955 [id number]  Reason for visit: Follow up bladder Ca   HPI: Return visit today, s/p TURBT on Monday    -Path = LGTa (muscle present, uninvolved)    Prior HPI: Previously followed by Dr. Penne Hx of NMIBC, s/p TURBT on 06/2018 - 2.cm papillary tumor at left posterior base   - path = LGTa (w/ 5% HG involvement)  05/2019 - small 2mm LG recurrence s/p office fulguration  09/2019 - cysto w/ <36mm heaped mucosa only 05/2023 - cysto NED 05/2024 - cysto with small papillary recurrence along Left bladder wall  05/17/24 - TURBT - LGTa (muscle present, uninvolved)    Hx of BPH - s/p HoLEP in 2023  Physical Exam: There were no vitals taken for this visit.   Constitutional:  Alert and oriented, No acute distress.   Laboratory Data:  FINAL DIAGNOSIS  Received Date: 05/17/2024        1. 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  He had 2 very small recurrent lesions consistent with LGTa).  Considering the long interval since his last recurrence in 2020, I think his risk group remains intermediate.  I reviewed the role of induction intravesical therapies--we would need to consider gemcitabine  rather than standard BCG considering his history of chronic methotrexate/Humira.  Alternatively, I would be fine with continuation of routine cystoscopic surveillance (and even office fulguration as needed) with periodic tissue sampling.         [name], MD  [facility] [address], [address] [place], [place] [address] [phone]

## 2024-05-20 ENCOUNTER — Encounter: Payer: Self-pay | Admitting: Urology

## 2024-05-20 ENCOUNTER — Ambulatory Visit (INDEPENDENT_AMBULATORY_CARE_PROVIDER_SITE_OTHER): Admitting: Urology

## 2024-05-20 VITALS — BP 127/70 | HR 99 | Ht 70.0 in | Wt 181.8 lb

## 2024-05-20 DIAGNOSIS — C672 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: Secondary | ICD-10-CM

## 2024-05-20 NOTE — Patient Instructions (Signed)
#  [id number] for CT Scheduling.

## 2024-05-20 NOTE — Addendum Note (Signed)
 Addended by: Farrell Broerman E on: 05/20/2024 10:06 AM   Modules accepted: Orders

## 2024-05-31 ENCOUNTER — Other Ambulatory Visit: Payer: Self-pay

## 2024-05-31 ENCOUNTER — Ambulatory Visit: Payer: Self-pay | Admitting: Urology

## 2024-05-31 ENCOUNTER — Ambulatory Visit
Admission: RE | Admit: 2024-05-31 | Discharge: 2024-05-31 | Disposition: A | Discharge status: Home / Self Care | Source: Ambulatory Visit | Attending: Urology | Admitting: Urology

## 2024-05-31 DIAGNOSIS — C672 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: Secondary | ICD-10-CM | POA: Insufficient documentation

## 2024-05-31 MED ORDER — IOHEXOL 300 MG/ML  SOLN
125.0000 mL | Freq: Once | INTRAMUSCULAR | Status: AC | PRN
  Administered 2024-05-31: 125 mL via INTRAVENOUS

## 2024-05-31 MED ORDER — IOHEXOL 300 MG/ML  SOLN: 125.0000 mL | Freq: Once | INTRAMUSCULAR | Status: DC | PRN

## 2024-05-31 NOTE — Telephone Encounter (Signed)
 Left patient a voicemail to call the office back to inform him of his CT scan results that are already reviewed by Dr.Garren. Also informed that we were reaching out to set up a telephone call with him and Dr.Garren to discuss his results further when he was available. Taylie Helder,CMA

## 2024-05-31 NOTE — Progress Notes (Signed)
 I reviewed his CTU- bladder findings likely secondary to post-TUR inflammation or edema. Continue with prior plan of q6 mo cystoscopy.  New small Right renal mass finding noted, concerning for small RCC. Will touch base with patient to review. We do not need to act overly aggressive, would recommend close surveillance and interval CT in ~4 months.  Nya/Humberta,  Can we offer this gentleman a brief telephone visit with me at his convenience?

## 2024-06-02 DIAGNOSIS — N2889 Other specified disorders of kidney and ureter: Secondary | ICD-10-CM | POA: Insufficient documentation

## 2024-06-02 NOTE — Assessment & Plan Note (Signed)
 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  I believe the notation regarding visible abnormal bladder contours along the left lateral wall represent post TUR status.  This is likely edema near our recent resection bed.  I did not appreciate any other significant lesions during recent cystoscopy.  Naturally, we will closely monitor this at next surveillance.   - plan for office cystoscopy in 6 months

## 2024-06-02 NOTE — Progress Notes (Unsigned)
 06/03/2024 8:41 AM   Nathan Lenis Ducat November 12, 1955 [id number]  Reason for visit: Follow up CT review   I connected with Nathan Anthony on 06/03/24 at  8:15 AM EDT by Audio + video and verified that I am speaking with the correct person using two identifiers.   Patient location: Home Provider location: [facility]   I discussed the limitations, risks, security and privacy concerns of performing an evaluation and management service by telephone and the availability of in person appointments. We discussed the impact of the COVID-19 pandemic on the healthcare system, and the importance of social distancing and reducing patient and provider exposure. I also discussed with the patient that there may be a patient responsible charge related to this service. The patient expressed understanding and agreed to proceed.  I provided 15 minutes of non-face-to-face time during this encounter.   HPI: 68 y.o. male, follow up with me today Reviewing CTU (Sept 2025) - new 1.2 cm enhancing right renal mass, 8 mm right interpolar stone (appears to be trapped within calyx versus intraparenchymal) He does mention ongoing intermittent severe right flank pain-typically caused by bumping into his right side Prior HPI: Previously followed by Dr. Penne Hx of NMIBC, s/p TURBT on 06/2018 - 2.cm papillary tumor at left posterior base   - path = LGTa (w/ 5% HG involvement)  05/2019 - small 2mm LG recurrence s/p office fulguration  09/2019 - cysto w/ <60mm heaped mucosa only 05/2023 - cysto NED 05/2024 - cysto with small papillary recurrence along Left bladder wall  05/17/24 - TURBT - LGTa (muscle present, uninvolved)    Hx of BPH - s/p HoLEP in 2023  Pertinent Imaging: I have personally viewed and interpreted the CTU (05/31/24) -small partially exophytic 1.2 cm right upper pole renal mass, enhancing.  8 mm right interpolar calyceal stone, appears nearly intraparenchymal.  Left bladder wall  irregularities noted-I do not see correspondent abnormality with partial contrast filling.   IMPRESSION: 1. There is a 1.2 x 1.2 x 1.4 cm partially exophytic lesion arising from the right kidney upper pole, posteriorly which exhibit moderate enhancement and is highly concerning for solid renal neoplasm. 2. There are 2, adjacent, slightly enhancing foci along the left paramedian posterior bladder wall with largest focus measuring to 7 x 8 mm, concerning for local recurrent neoplasm in this patient with known history of bladder carcinoma. Correlate with cystoscopy. 3. No metastatic disease identified within the abdomen or pelvis. 4. Multiple other nonacute observations, as described above..   Assessment & Plan:    Renal mass Assessment & Plan: 1.2 cm Right upper pole enhancing renal mass, partially exophytic  Incidentally noted on recent surveillance CT urogram for bladder cancer.  I reviewed his radiographic images today.  Overall this may represent a small renal cell carcinoma, but does not pose any immediate threat or warrant aggressive intervention.  I would strongly favor initial active surveillance with interval CT imaging in 4-6 months.  The rate of metastasis of a small RCC at this size is extremely low at <3-5%.  Active surveillance entails routine periodic imaging to monitor growth rate as well as change in lesion characteristics.  I briefly reviewed definitive treatment in the form of surgical removal /partial nephrectomy-which is a major procedure with attendant peri-operative risks.   - interval CT surveillance in 4-6 months  Orders: -     CT ABDOMEN PELVIS W CONTRAST; Future  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) Assessment & Plan: Intermediate-risk  NMIBC (dx 2019)    - path = LGTa (w/ 5% HG involvement)  Recurrence in 2020 (LG-appearing)   S/p TURBT (05/17/24) - LGTa (muscle-present, uninvolved)  I reviewed his recent CT urogram.  I believe the notation  regarding visible abnormal bladder contours along the left lateral wall represent post TUR status.  This is likely edema near our recent resection bed.  I did not appreciate any other significant lesions during recent cystoscopy.  Naturally, we will closely monitor this at next surveillance.   - plan for office cystoscopy in 6 months  Orders: -     CT ABDOMEN PELVIS W CONTRAST; Future  Nephrolithiasis Assessment & Plan: 8 mm right interpolar calyceal stone, appears nearly intraparenchymal Chronic, intermittent right flank pain-triggered by bumping into flank  Unclear if radiographic finding correlates with his pain.  Typically nonobstructive renal stones do not cause pain, however trapped calyceal stones can certainly elicit renal colic.  Difficult to say if his noted 8 mm stone is responsible.  Ureteroscopy with laser lithotripsy would be a reasonable option-although failure to locate the stone or enter the calyx is very possible.  Ultimately he preferred to continue monitoring, reassess stone with interval CT scan for renal mass.   - Will continue to monitor, reevaluate with interval CT scan        [name], MD  [facility] [address] [place], [place] [address] [phone]

## 2024-06-02 NOTE — Assessment & Plan Note (Signed)
 1.2 cm Right upper pole enhancing renal mass, partially exophytic  Incidentally noted on recent surveillance CT urogram for bladder cancer.  I reviewed his radiographic images today.  Overall this may represent a small renal cell carcinoma, but does not pose any immediate threat or warrant aggressive intervention.  I would strongly favor initial active surveillance with interval CT imaging in 4-6 months.  The rate of metastasis of a small RCC at this size is extremely low at <3-5%.  Active surveillance entails routine periodic imaging to monitor growth rate as well as change in lesion characteristics.  I briefly reviewed definitive treatment in the form of surgical removal /partial nephrectomy-which is a major procedure with attendant peri-operative risks.   - interval CT surveillance in 4-6 months

## 2024-06-03 ENCOUNTER — Telehealth (INDEPENDENT_AMBULATORY_CARE_PROVIDER_SITE_OTHER): Admitting: Urology

## 2024-06-03 ENCOUNTER — Encounter

## 2024-06-03 DIAGNOSIS — C672 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: Secondary | ICD-10-CM

## 2024-06-03 DIAGNOSIS — N2889 Other specified disorders of kidney and ureter: Secondary | ICD-10-CM

## 2024-06-03 DIAGNOSIS — N200 Calculus of kidney: Secondary | ICD-10-CM | POA: Diagnosis not present

## 2024-06-03 NOTE — Assessment & Plan Note (Addendum)
 8 mm right interpolar calyceal stone, appears nearly intraparenchymal Chronic, intermittent right flank pain-triggered by bumping into flank  Unclear if radiographic finding correlates with his pain.  Typically nonobstructive renal stones do not cause pain, however trapped calyceal stones can certainly elicit renal colic.  Difficult to say if his noted 8 mm stone is responsible.  Ureteroscopy with laser lithotripsy would be a reasonable option-although failure to locate the stone or enter the calyx is very possible.  Ultimately he preferred to continue monitoring, reassess stone with interval CT scan for renal mass.   - Will continue to monitor, reevaluate with interval CT scan

## 2024-06-06 ENCOUNTER — Other Ambulatory Visit: Payer: Self-pay | Admitting: Internal Medicine

## 2024-06-06 DIAGNOSIS — I700 Atherosclerosis of aorta: Secondary | ICD-10-CM

## 2024-06-06 DIAGNOSIS — E782 Mixed hyperlipidemia: Secondary | ICD-10-CM

## 2024-06-07 NOTE — Telephone Encounter (Signed)
 Requested Prescriptions  Pending Prescriptions Disp Refills   rosuvastatin  (CRESTOR ) 20 MG tablet [Pharmacy Med Name: ROSUVASTATIN  TABS 20MG ] 90 tablet 0    Sig: TAKE 1 TABLET DAILY (NEED APPOINTMENT FOR FURTHER REFILLS)     Cardiovascular:  Antilipid - Statins 2 Failed - 06/07/2024  3:25 PM      Failed - Lipid Panel in normal range within the last 12 months    Cholesterol, Total  Date Value Ref Range Status  07/01/2023 135 100 - 199 mg/dL Final   LDL Chol Calc (NIH)  Date Value Ref Range Status  07/01/2023 75 0 - 99 mg/dL Final   HDL  Date Value Ref Range Status  07/01/2023 32 (L) >39 mg/dL Final   Triglycerides  Date Value Ref Range Status  07/01/2023 164 (H) 0 - 149 mg/dL Final         Passed - Cr in normal range and within 360 days    Creatinine, Ser  Date Value Ref Range Status  07/01/2023 0.87 0.76 - 1.27 mg/dL Final         Passed - Patient is not pregnant      Passed - Valid encounter within last 12 months    Recent Outpatient Visits   None     Future Appointments             In 3 weeks Justus, Leita DEL, MD [facility] at [facility], [id number]   In 8 months Hester Alm BROCKS, MD [facility]

## 2024-07-04 ENCOUNTER — Ambulatory Visit (INDEPENDENT_AMBULATORY_CARE_PROVIDER_SITE_OTHER): Payer: Self-pay | Admitting: Internal Medicine

## 2024-07-04 ENCOUNTER — Encounter: Payer: Self-pay | Admitting: Internal Medicine

## 2024-07-04 VITALS — BP 122/70 | HR 66 | Ht 70.0 in | Wt 180.0 lb

## 2024-07-04 DIAGNOSIS — N401 Enlarged prostate with lower urinary tract symptoms: Secondary | ICD-10-CM | POA: Diagnosis not present

## 2024-07-04 DIAGNOSIS — K219 Gastro-esophageal reflux disease without esophagitis: Secondary | ICD-10-CM | POA: Diagnosis not present

## 2024-07-04 DIAGNOSIS — J449 Chronic obstructive pulmonary disease, unspecified: Secondary | ICD-10-CM

## 2024-07-04 DIAGNOSIS — M05711 Rheumatoid arthritis with rheumatoid factor of right shoulder without organ or systems involvement: Secondary | ICD-10-CM

## 2024-07-04 DIAGNOSIS — I700 Atherosclerosis of aorta: Secondary | ICD-10-CM | POA: Diagnosis not present

## 2024-07-04 DIAGNOSIS — M05712 Rheumatoid arthritis with rheumatoid factor of left shoulder without organ or systems involvement: Secondary | ICD-10-CM

## 2024-07-04 DIAGNOSIS — K4090 Unilateral inguinal hernia, without obstruction or gangrene, not specified as recurrent: Secondary | ICD-10-CM

## 2024-07-04 DIAGNOSIS — N138 Other obstructive and reflux uropathy: Secondary | ICD-10-CM

## 2024-07-04 DIAGNOSIS — Z23 Encounter for immunization: Secondary | ICD-10-CM | POA: Diagnosis not present

## 2024-07-04 DIAGNOSIS — Z7960 Long term (current) use of unspecified immunomodulators and immunosuppressants: Secondary | ICD-10-CM

## 2024-07-04 NOTE — Assessment & Plan Note (Signed)
 LDL is  Lab Results  Component Value Date   LDLCALC 75 07/01/2023    Current medication regimen is Crestor . Goal LDL is < 70.

## 2024-07-04 NOTE — Assessment & Plan Note (Addendum)
 Followed by Rheumatology - on Humira and methotrexate. He may discuss alternative tx to Humira at next visit - concern for a link to urological cancers

## 2024-07-04 NOTE — Assessment & Plan Note (Signed)
 Currently asymptomatic - will continue to monitor and refer if worsening.

## 2024-07-04 NOTE — Assessment & Plan Note (Signed)
 S/p surgery and doing well. Last PSA one year ago.

## 2024-07-04 NOTE — Assessment & Plan Note (Signed)
 Currently taking omeprazole  with minimal reflux symptoms. Patient denies red flag symptoms - no melena, weight loss, dysphagia. Will maintain current management.

## 2024-07-04 NOTE — Progress Notes (Signed)
 Date:  07/04/2024   Name:  Sheikh Leverich   DOB:  1956/01/28   MRN:  [id number]   Chief Complaint: Annual Exam Manas Hickling is a 68 y.o. male who presents today for his Complete Annual Exam. He feels fairly well. He reports exercising. He reports he is sleeping fairly well.  Health Maintenance  Topic Date Due   Zoster (Shingles) Vaccine (1 of 2) Never done   Flu Shot  04/01/2024   COVID-19 Vaccine (3 - 2025-26 season) 05/02/2024   DTaP/Tdap/Td vaccine (2 - Td or Tdap) 12/11/2024   Medicare Annual Wellness Visit  12/29/2024   Colon Cancer Screening  06/08/2030   Pneumococcal Vaccine for age over 68  Completed   Hepatitis C Screening  Completed   Meningitis B Vaccine  Aged Out   Lab Results  Component Value Date   PSA1 0.2 05/13/2023   PSA1 0.2 05/13/2022   PSA1 0.7 05/10/2021    Gastroesophageal Reflux He reports no abdominal pain, no chest pain, no coughing or no wheezing. This is a recurrent problem. The problem occurs rarely. Pertinent negatives include no fatigue. He has tried a PPI for the symptoms. The treatment provided significant relief.  Hyperlipidemia This is a chronic problem. The problem is controlled. Pertinent negatives include no chest pain or shortness of breath. Current antihyperlipidemic treatment includes statins. The current treatment provides significant improvement of lipids.  COPD - on albuterol  PRN, singulair . RA - followed by Rheum, on Methotrexate and Humira. Bladder cancer - recently seen by Urology; had cystoscopy and removal of tumor for the third time. Also seen for small renal mass suspicious for RCCA - recommending repeat scan in 6 mo.  Review of Systems  Constitutional:  Negative for fatigue and unexpected weight change.  HENT:  Positive for congestion. Negative for nosebleeds, sinus pressure and sinus pain.   Eyes:  Negative for visual disturbance.  Respiratory:  Negative for cough, chest tightness, shortness of breath and wheezing.    Cardiovascular:  Negative for chest pain, palpitations and leg swelling.  Gastrointestinal:  Negative for abdominal pain, constipation and diarrhea.  Genitourinary:  Negative for dysuria and hematuria.       Left sided hernia  Musculoskeletal:  Negative for arthralgias and gait problem.  Allergic/Immunologic: Positive for environmental allergies.  Neurological:  Negative for dizziness, weakness, light-headedness and headaches.  Psychiatric/Behavioral:  Negative for dysphoric mood and sleep disturbance. The patient is not nervous/anxious.      Lab Results  Component Value Date   NA 142 07/01/2023   K 4.3 07/01/2023   CO2 23 07/01/2023   GLUCOSE 92 07/01/2023   BUN 17 07/01/2023   CREATININE 0.87 07/01/2023   CALCIUM  9.2 07/01/2023   EGFR 95 07/01/2023   GFRNONAA >60 09/12/2021   Lab Results  Component Value Date   CHOL 135 07/01/2023   HDL 32 (L) 07/01/2023   LDLCALC 75 07/01/2023   TRIG 164 (H) 07/01/2023   CHOLHDL 4.2 07/01/2023   Lab Results  Component Value Date   TSH 2.630 05/10/2021   No results found for: HGBA1C Lab Results  Component Value Date   WBC 10.7 06/25/2022   HGB 13.0 06/25/2022   HCT 41.8 06/25/2022   MCV 81 06/25/2022   PLT 498 (H) 06/25/2022   Lab Results  Component Value Date   ALT 15 07/01/2023   AST 21 07/01/2023   ALKPHOS 108 07/01/2023   BILITOT 0.4 07/01/2023   Lab Results  Component Value Date  VD25OH 46.6 07/01/2023     Patient Active Problem List   Diagnosis Date Noted   Nephrolithiasis 06/03/2024   Renal mass 06/02/2024   Long-term use of immunosuppressant medication 03/16/2024   Former smoker 10/10/2020   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) 05/03/2020   Abdominal aortic atherosclerosis 04/11/2020   Acquired deviated nasal septum 09/23/2019   BPH with obstruction/lower urinary tract symptoms 04/22/2018   DISH (diffuse idiopathic skeletal hyperostosis) 11/14/2016   Calcified granuloma of lung  02/06/2016   Unilateral inguinal hernia without obstruction or gangrene 05/22/2015   GERD (gastroesophageal reflux disease) 11/15/2014   Mixed hyperlipidemia 11/15/2014   COPD mixed type (HCC) 11/15/2014   Osteoporosis without current pathological fracture 11/15/2014   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) 11/15/2014    Allergies  Allergen Reactions   Pravastatin Nausea Only    Other reaction(s): Nausea Only   Simvastatin Nausea Only    Other reaction(s): Nausea Only    Past Surgical History:  Procedure Laterality Date   BASAL CELL CARCINOMA EXCISION     BLADDER INSTILLATION N/A 05/17/2024   Procedure: INSTILLATION, BLADDER;  Surgeon: Georganne Penne SAUNDERS, MD;  Location: [facility];  Service: Urology;  Laterality: N/A;   COLONOSCOPY  06/06/2010   COLONOSCOPY WITH PROPOFOL  N/A 06/08/2020   Procedure: COLONOSCOPY WITH PROPOFOL ;  Surgeon: Jinny Carmine, MD;  Location: [facility];  Service: Endoscopy;  Laterality: N/A;  priority 4   ETHMOIDECTOMY Bilateral 07/03/2020   Procedure: ETHMOIDECTOMY;  Surgeon: Blair Mt, MD;  Location: [facility];  Service: ENT;  Laterality: Bilateral;   FRONTAL SINUS EXPLORATION Bilateral 07/03/2020   Procedure: FRONTAL SINUS EXPLORATION;  Surgeon: Blair Mt, MD;  Location: [facility];  Service: ENT;  Laterality: Bilateral;   HERNIA REPAIR Right 2006   HOLEP-LASER ENUCLEATION OF THE PROSTATE WITH MORCELLATION N/A 09/23/2021   Procedure: HOLEP-LASER ENUCLEATION OF THE PROSTATE WITH MORCELLATION;  Surgeon: Penne Knee, MD;  Location: [facility];  Service: Urology;  Laterality: N/A;   IMAGE GUIDED SINUS SURGERY N/A 07/03/2020   Procedure: IMAGE GUIDED SINUS SURGERY;  Surgeon: Blair Mt, MD;  Location: [facility];  Service: ENT;  Laterality: N/A;  need [employer] disk PLACE DISK ON OR CHARGE NURSE DESK 10-01 KP Another disk on OR charge nurse desk 10.14.21 DS   MAXILLARY  ANTROSTOMY Bilateral 07/03/2020   Procedure: MAXILLARY ANTROSTOMY with tissue removal;  Surgeon: Blair Mt, MD;  Location: [facility];  Service: ENT;  Laterality: Bilateral;   NASAL SEPTOPLASTY W/ TURBINOPLASTY Bilateral 07/03/2020   Procedure: NASAL SEPTOPLASTY;  Surgeon: Blair Mt, MD;  Location: [facility];  Service: ENT;  Laterality: Bilateral;   SPHENOIDECTOMY Bilateral 07/03/2020   Procedure: CLEONE;  Surgeon: Blair Mt, MD;  Location: [facility];  Service: ENT;  Laterality: Bilateral;   TRANSURETHRAL RESECTION OF BLADDER TUMOR N/A 05/17/2024   Procedure: TURBT (TRANSURETHRAL RESECTION OF BLADDER TUMOR);  Surgeon: Georganne Penne SAUNDERS, MD;  Location: [facility];  Service: Urology;  Laterality: N/A;   TRANSURETHRAL RESECTION OF BLADDER TUMOR WITH MITOMYCIN -C N/A 06/21/2018   Procedure: TRANSURETHRAL RESECTION OF BLADDER TUMOR WITH Gemcitabine ;  Surgeon: Penne Knee, MD;  Location: [facility];  Service: Urology;  Laterality: N/A;   VARICOCELECTOMY      Social History   Tobacco Use   Smoking status: Former    Current packs/day: 0.00    Average packs/day: 0.5 packs/day for 8.0 years (4.0 ttl pk-yrs)    Types: Pipe, Cigars, Cigarettes  Start date: 02/13/1974    Quit date: 2019    Years since quitting: 6.8    Passive exposure: Past   Smokeless tobacco: Never  Vaping Use   Vaping status: Never Used  Substance Use Topics   Alcohol use: Not Currently    Comment: 2 beers or Mead (wine made from honey) 1-2 times per month   Drug use: No     Medication list has been reviewed and updated.  Current Meds  Medication Sig   Adalimumab 40 MG/0.8ML PNKT Inject 40 mg into the skin every 14 (fourteen) days.   albuterol  (PROAIR  HFA) 108 (90 Base) MCG/ACT inhaler USE 1 INHALATION EVERY 6 HOURS AS NEEDED FOR WHEEZING OR SHORTNESS OF BREATH (NEEDS APPOINTMENT FOR FURTHER REFILLS)   Aspirin-Acetaminophen -Caffeine (GOODYS EXTRA STRENGTH PO) Take 1  packet by mouth daily as needed (headaches).   calcium -vitamin D  250-100 MG-UNIT tablet Take 1 tablet by mouth daily.   folic acid (FOLVITE) 1 MG tablet Take 1 mg by mouth See admin instructions. Take 1 mg by mouth Tuesday-Sunday.   ketotifen (ZADITOR) 0.025 % ophthalmic solution Place 1 drop into both eyes daily as needed (allergies).   loratadine (CLARITIN) 10 MG tablet Take 10 mg by mouth every morning.   meloxicam (MOBIC) 15 MG tablet Take 15 mg by mouth every morning.   methotrexate (RHEUMATREX) 2.5 MG tablet Take 20 mg by mouth every Monday.   montelukast (SINGULAIR) 10 MG tablet TAKE 1 TABLET DAILY (Patient taking differently: Take 10 mg by mouth at bedtime.)   Multiple Vitamin (MULTIVITAMIN) capsule Take 1 capsule by mouth daily.   omeprazole (PRILOSEC) 20 MG capsule TAKE 1 CAPSULE DAILY (Patient taking differently: Take 20 mg by mouth every morning.)   rosuvastatin (CRESTOR) 20 MG tablet TAKE 1 TABLET DAILY (NEED APPOINTMENT FOR FURTHER REFILLS)   triamcinolone (NASACORT) 55 MCG/ACT AERO nasal inhaler Place 2 sprays into the nose daily.       07/04/2024    8:14 AM 07/01/2023    8:31 AM 06/25/2022    1:22 PM 05/10/2021    8:35 AM  GAD 7 : Generalized Anxiety Score  Nervous, Anxious, on Edge 0 0 0 0  Control/stop worrying 0 0 0 0  Worry too much - different things 0 0 0 0  Trouble relaxing 0 0 0 0  Restless 0 0 0 0  Easily annoyed or irritable 0 0 0 0  Afraid - awful might happen 0 0 0 0  Total GAD 7 Score 0 0 0 0  Anxiety Difficulty Not difficult at all Not difficult at all Not difficult at all Not difficult at all       11 /10/2023    8:14 AM 12/30/2023   11:06 AM 07/01/2023    8:31 AM  Depression screen PHQ 2/9  Decreased Interest 0 0 0  Down, Depressed, Hopeless 0 0 0  PHQ - 2 Score 0 0 0  Altered sleeping 0 0 0  Tired, decreased energy 0 0 0  Change in appetite 0 0 0  Feeling bad or failure about yourself  0 0 0  Trouble concentrating 0 0 0  Moving slowly or  fidgety/restless 0 0 3  Suicidal thoughts 0 0 0  PHQ-9 Score 0 0 3  Difficult doing work/chores Not difficult at all Not difficult at all Not difficult at all    BP Readings from Last 3 Encounters:  07/04/24 122/70  05/20/24 127/70  05/17/24 (!) 164/88    Physical Exam Vitals and  nursing note reviewed.  Constitutional:      Appearance: Normal appearance. He is well-developed.  HENT:     Head: Normocephalic.     Right Ear: Tympanic membrane, ear canal and external ear normal.     Left Ear: Tympanic membrane, ear canal and external ear normal.     Nose: Nose normal.     Right Sinus: No maxillary sinus tenderness or frontal sinus tenderness.     Left Sinus: No maxillary sinus tenderness or frontal sinus tenderness.     Mouth/Throat:     Pharynx: No pharyngeal swelling, oropharyngeal exudate or posterior oropharyngeal erythema.  Eyes:     Conjunctiva/sclera: Conjunctivae normal.     Pupils: Pupils are equal, round, and reactive to light.  Neck:     Thyroid: No thyromegaly.     Vascular: No carotid bruit.  Cardiovascular:     Rate and Rhythm: Normal rate and regular rhythm.     Heart sounds: Normal heart sounds.  Pulmonary:     Effort: Pulmonary effort is normal.     Breath sounds: Normal breath sounds. No wheezing.  Chest:  Breasts:    Right: No mass.     Left: No mass.  Abdominal:     General: Bowel sounds are normal.     Palpations: Abdomen is soft.     Tenderness: There is no abdominal tenderness.     Hernia: A hernia is present. Hernia is present in the left inguinal area.  Genitourinary:      Comments: 5 cm soft, non tender, minimally reducible hernia on left Musculoskeletal:        General: Normal range of motion.     Cervical back: Normal range of motion and neck supple.     Right lower leg: No edema.     Left lower leg: No edema.  Lymphadenopathy:     Cervical: No cervical adenopathy.  Skin:    General: Skin is warm and dry.     Capillary Refill:  Capillary refill takes less than 2 seconds.  Neurological:     General: No focal deficit present.     Mental Status: He is alert and oriented to person, place, and time.     Deep Tendon Reflexes: Reflexes are normal and symmetric.  Psychiatric:        Attention and Perception: Attention normal.        Mood and Affect: Mood normal.        Thought Content: Thought content normal.     Wt Readings from Last 3 Encounters:  07/04/24 180 lb (81.6 kg)  05/20/24 181 lb 12.8 oz (82.5 kg)  05/17/24 180 lb (81.6 kg)    BP 122/70   Pulse 66   Ht 5' 10 (1.778 m)   Wt 180 lb (81.6 kg)   SpO2 96%   BMI 25.83 kg/m   Assessment and Plan:  Problem List Items Addressed This Visit       Unprioritized   GERD (gastroesophageal reflux disease) (Chronic)   Currently taking omeprazole  with minimal reflux symptoms. Patient denies red flag symptoms - no melena, weight loss, dysphagia. Will maintain current management.       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 He may discuss alternative tx to Humira at next visit - concern for a link to urological cancers       Unilateral inguinal hernia without obstruction or gangrene   Currently asymptomatic - will continue to monitor and refer if worsening.      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 Last PSA one year ago.      Relevant Orders   PSA   Abdominal aortic atherosclerosis - Primary (Chronic)   LDL is  Lab Results  Component Value Date   LDLCALC 75 07/01/2023    Current medication regimen is Crestor . Goal LDL is < 70.       Relevant Orders   Comprehensive metabolic panel with GFR    Lipid panel   Long-term use of immunosuppressant medication   Relevant Orders   Comprehensive metabolic panel with GFR   CBC with Differential/Platelet    Return in about 1 year (around 07/04/2025) for Medicare annual  Dr. Lemon.    Leita HILARIO Adie, MD [facility] [place]

## 2024-07-04 NOTE — Assessment & Plan Note (Signed)
 Doing well on PRN Albuterol

## 2024-07-05 ENCOUNTER — Ambulatory Visit: Payer: Self-pay | Admitting: Internal Medicine

## 2024-07-05 LAB — LIPID PANEL
Chol/HDL Ratio: 4.8 ratio (ref 0.0–5.0)
Cholesterol, Total: 129 mg/dL (ref 100–199)
HDL: 27 mg/dL — ABNORMAL LOW (ref >39)
LDL Chol Calc (NIH): 68 mg/dL (ref 0–99)
Triglycerides: 206 mg/dL — ABNORMAL HIGH (ref 0–149)
VLDL Cholesterol Cal: 34 mg/dL (ref 5–40)

## 2024-07-05 LAB — COMPREHENSIVE METABOLIC PANEL WITH GFR
ALT: 17 IU/L (ref 0–44)
AST: 23 IU/L (ref 0–40)
Albumin: 3.7 g/dL — ABNORMAL LOW (ref 3.9–4.9)
Alkaline Phosphatase: 98 IU/L (ref 47–123)
BUN/Creatinine Ratio: 21 (ref 10–24)
BUN: 15 mg/dL (ref 8–27)
Bilirubin Total: 0.3 mg/dL (ref 0.0–1.2)
CO2: 25 mmol/L (ref 20–29)
Calcium: 8.8 mg/dL (ref 8.6–10.2)
Chloride: 103 mmol/L (ref 96–106)
Creatinine, Ser: 0.72 mg/dL — ABNORMAL LOW (ref 0.76–1.27)
Globulin, Total: 3.4 g/dL (ref 1.5–4.5)
Glucose: 97 mg/dL (ref 70–99)
Potassium: 4.8 mmol/L (ref 3.5–5.2)
Sodium: 138 mmol/L (ref 134–144)
Total Protein: 7.1 g/dL (ref 6.0–8.5)
eGFR: 100 mL/min/1.73 (ref >59)

## 2024-07-05 LAB — CBC WITH DIFFERENTIAL/PLATELET
Basophils Absolute: 0.1 x10E3/uL (ref 0.0–0.2)
Basos: 1 %
EOS (ABSOLUTE): 0.7 x10E3/uL — ABNORMAL HIGH (ref 0.0–0.4)
Eos: 6 %
Hematocrit: 38.9 % (ref 37.5–51.0)
Hemoglobin: 12 g/dL — ABNORMAL LOW (ref 13.0–17.7)
Immature Grans (Abs): 0 x10E3/uL (ref 0.0–0.1)
Immature Granulocytes: 0 %
Lymphocytes Absolute: 1.9 x10E3/uL (ref 0.7–3.1)
Lymphs: 18 %
MCH: 26 pg — ABNORMAL LOW (ref 26.6–33.0)
MCHC: 30.8 g/dL — ABNORMAL LOW (ref 31.5–35.7)
MCV: 84 fL (ref 79–97)
Monocytes Absolute: 0.9 x10E3/uL (ref 0.1–0.9)
Monocytes: 9 %
Neutrophils Absolute: 6.7 x10E3/uL (ref 1.4–7.0)
Neutrophils: 66 %
Platelets: 441 x10E3/uL (ref 150–450)
RBC: 4.61 x10E6/uL (ref 4.14–5.80)
RDW: 16.2 % — ABNORMAL HIGH (ref 11.6–15.4)
WBC: 10.3 x10E3/uL (ref 3.4–10.8)

## 2024-07-05 LAB — PSA: Prostate Specific Ag, Serum: 0.2 ng/mL (ref 0.0–4.0)

## 2024-07-27 ENCOUNTER — Other Ambulatory Visit: Payer: Self-pay | Admitting: Primary Care

## 2024-07-27 DIAGNOSIS — J4530 Mild persistent asthma, uncomplicated: Secondary | ICD-10-CM

## 2024-08-24 ENCOUNTER — Other Ambulatory Visit: Payer: Self-pay | Admitting: Internal Medicine

## 2024-08-24 DIAGNOSIS — K219 Gastro-esophageal reflux disease without esophagitis: Secondary | ICD-10-CM

## 2024-08-24 DIAGNOSIS — I700 Atherosclerosis of aorta: Secondary | ICD-10-CM

## 2024-08-24 DIAGNOSIS — E782 Mixed hyperlipidemia: Secondary | ICD-10-CM

## 2024-08-26 NOTE — Telephone Encounter (Signed)
 Requested Prescriptions  Pending Prescriptions Disp Refills   rosuvastatin  (CRESTOR ) 20 MG tablet [Pharmacy Med Name: ROSUVASTATIN  TABS 20MG ] 90 tablet 0    Sig: TAKE 1 TABLET DAILY (NEED APPOINTMENT FOR FURTHER REFILLS)     Cardiovascular:  Antilipid - Statins 2 Failed - 08/26/2024  1:18 PM      Failed - Cr in normal range and within 360 days    Creatinine, Ser  Date Value Ref Range Status  07/04/2024 0.72 (L) 0.76 - 1.27 mg/dL Final         Failed - Lipid Panel in normal range within the last 12 months    Cholesterol, Total  Date Value Ref Range Status  07/04/2024 129 100 - 199 mg/dL Final   LDL Chol Calc (NIH)  Date Value Ref Range Status  07/04/2024 68 0 - 99 mg/dL Final   HDL  Date Value Ref Range Status  07/04/2024 27 (L) >39 mg/dL Final   Triglycerides  Date Value Ref Range Status  07/04/2024 206 (H) 0 - 149 mg/dL Final         Passed - Patient is not pregnant      Passed - Valid encounter within last 12 months    Recent Outpatient Visits           1 month ago Abdominal aortic atherosclerosis   [place] [facility] at [facility], Leita DEL, MD       Future Appointments             In 6 months Hester Alm BROCKS, MD [facility]             omeprazole  (PRILOSEC) 20 MG capsule [Pharmacy Med Name: OMEPRAZOLE  DR CAPS 20MG ] 90 capsule 0    Sig: Take 1 capsule (20 mg total) by mouth every morning.     Gastroenterology: Proton Pump Inhibitors Passed - 08/26/2024  1:18 PM      Passed - Valid encounter within last 12 months    Recent Outpatient Visits           1 month ago Abdominal aortic atherosclerosis   [place] [facility] at [facility], Leita DEL, MD       Future Appointments             In 6 months Hester Alm BROCKS, MD [facility]

## 2024-11-02 ENCOUNTER — Other Ambulatory Visit

## 2024-11-17 ENCOUNTER — Other Ambulatory Visit: Admitting: Urology

## 2025-01-04 ENCOUNTER — Ambulatory Visit

## 2025-02-22 ENCOUNTER — Ambulatory Visit: Admitting: Dermatology

## 2025-07-05 ENCOUNTER — Encounter: Admitting: Student
